# Patient Record
Sex: Female | Born: 1935 | Race: Black or African American | Hispanic: No | Smoking: Never smoker
Health system: Southern US, Community
[De-identification: ages and names within clinical notes are randomized; demographics above are authoritative.]

## PROBLEM LIST (undated history)

## (undated) DIAGNOSIS — F329 Major depressive disorder, single episode, unspecified: Secondary | ICD-10-CM

## (undated) DIAGNOSIS — F32A Depression, unspecified: Secondary | ICD-10-CM

## (undated) DIAGNOSIS — F419 Anxiety disorder, unspecified: Secondary | ICD-10-CM

## (undated) DIAGNOSIS — M51369 Other intervertebral disc degeneration, lumbar region without mention of lumbar back pain or lower extremity pain: Secondary | ICD-10-CM

## (undated) DIAGNOSIS — M5126 Other intervertebral disc displacement, lumbar region: Secondary | ICD-10-CM

## (undated) DIAGNOSIS — T7840XA Allergy, unspecified, initial encounter: Secondary | ICD-10-CM

## (undated) DIAGNOSIS — M5416 Radiculopathy, lumbar region: Secondary | ICD-10-CM

## (undated) DIAGNOSIS — E785 Hyperlipidemia, unspecified: Secondary | ICD-10-CM

## (undated) DIAGNOSIS — M545 Low back pain, unspecified: Secondary | ICD-10-CM

## (undated) DIAGNOSIS — M1990 Unspecified osteoarthritis, unspecified site: Secondary | ICD-10-CM

## (undated) DIAGNOSIS — M4800 Spinal stenosis, site unspecified: Secondary | ICD-10-CM

## (undated) DIAGNOSIS — M5136 Other intervertebral disc degeneration, lumbar region: Secondary | ICD-10-CM

## (undated) DIAGNOSIS — I10 Essential (primary) hypertension: Secondary | ICD-10-CM

## (undated) DIAGNOSIS — N319 Neuromuscular dysfunction of bladder, unspecified: Secondary | ICD-10-CM

## (undated) DIAGNOSIS — F0280 Dementia in other diseases classified elsewhere without behavioral disturbance: Secondary | ICD-10-CM

## (undated) HISTORY — DX: Other intervertebral disc displacement, lumbar region: M51.26

## (undated) HISTORY — DX: Allergy, unspecified, initial encounter: T78.40XA

## (undated) HISTORY — PX: WISDOM TOOTH EXTRACTION: SHX21

## (undated) HISTORY — PX: ROTATOR CUFF REPAIR: SHX139

## (undated) HISTORY — PX: OTHER SURGICAL HISTORY: SHX169

## (undated) HISTORY — DX: Neuromuscular dysfunction of bladder, unspecified: N31.9

## (undated) HISTORY — DX: Anxiety disorder, unspecified: F41.9

## (undated) HISTORY — DX: Major depressive disorder, single episode, unspecified: F32.9

## (undated) HISTORY — PX: CATARACT EXTRACTION: SUR2

## (undated) HISTORY — DX: Dementia in other diseases classified elsewhere, unspecified severity, without behavioral disturbance, psychotic disturbance, mood disturbance, and anxiety: F02.80

## (undated) HISTORY — DX: Hyperlipidemia, unspecified: E78.5

## (undated) HISTORY — DX: Other intervertebral disc degeneration, lumbar region without mention of lumbar back pain or lower extremity pain: M51.369

## (undated) HISTORY — DX: Low back pain: M54.5

## (undated) HISTORY — DX: Other intervertebral disc degeneration, lumbar region: M51.36

## (undated) HISTORY — DX: Depression, unspecified: F32.A

## (undated) HISTORY — DX: Low back pain, unspecified: M54.50

## (undated) HISTORY — DX: Essential (primary) hypertension: I10

## (undated) HISTORY — DX: Unspecified osteoarthritis, unspecified site: M19.90

## (undated) HISTORY — PX: BREAST CYST EXCISION: SHX579

## (undated) HISTORY — DX: Radiculopathy, lumbar region: M54.16

## (undated) HISTORY — DX: Spinal stenosis, site unspecified: M48.00

---

## 2016-08-25 DIAGNOSIS — Z1231 Encounter for screening mammogram for malignant neoplasm of breast: Secondary | ICD-10-CM | POA: Diagnosis not present

## 2016-08-25 DIAGNOSIS — E785 Hyperlipidemia, unspecified: Secondary | ICD-10-CM | POA: Diagnosis not present

## 2016-08-25 DIAGNOSIS — I10 Essential (primary) hypertension: Secondary | ICD-10-CM | POA: Diagnosis not present

## 2016-10-13 DIAGNOSIS — Z1231 Encounter for screening mammogram for malignant neoplasm of breast: Secondary | ICD-10-CM | POA: Diagnosis not present

## 2016-12-08 DIAGNOSIS — H524 Presbyopia: Secondary | ICD-10-CM | POA: Diagnosis not present

## 2016-12-08 DIAGNOSIS — H5213 Myopia, bilateral: Secondary | ICD-10-CM | POA: Diagnosis not present

## 2016-12-08 DIAGNOSIS — H43813 Vitreous degeneration, bilateral: Secondary | ICD-10-CM | POA: Diagnosis not present

## 2016-12-08 DIAGNOSIS — H04123 Dry eye syndrome of bilateral lacrimal glands: Secondary | ICD-10-CM | POA: Diagnosis not present

## 2017-03-30 DIAGNOSIS — N319 Neuromuscular dysfunction of bladder, unspecified: Secondary | ICD-10-CM | POA: Diagnosis not present

## 2017-03-30 DIAGNOSIS — I10 Essential (primary) hypertension: Secondary | ICD-10-CM | POA: Diagnosis not present

## 2017-03-30 DIAGNOSIS — G8929 Other chronic pain: Secondary | ICD-10-CM | POA: Diagnosis not present

## 2017-03-30 DIAGNOSIS — E785 Hyperlipidemia, unspecified: Secondary | ICD-10-CM | POA: Diagnosis not present

## 2017-03-30 DIAGNOSIS — M545 Low back pain: Secondary | ICD-10-CM | POA: Diagnosis not present

## 2017-04-16 DIAGNOSIS — Z23 Encounter for immunization: Secondary | ICD-10-CM | POA: Diagnosis not present

## 2017-06-24 DIAGNOSIS — G8929 Other chronic pain: Secondary | ICD-10-CM | POA: Diagnosis not present

## 2017-06-24 DIAGNOSIS — M545 Low back pain: Secondary | ICD-10-CM | POA: Diagnosis not present

## 2017-06-24 DIAGNOSIS — M48062 Spinal stenosis, lumbar region with neurogenic claudication: Secondary | ICD-10-CM | POA: Diagnosis not present

## 2017-07-13 DIAGNOSIS — J0141 Acute recurrent pansinusitis: Secondary | ICD-10-CM | POA: Diagnosis not present

## 2017-07-13 DIAGNOSIS — R69 Illness, unspecified: Secondary | ICD-10-CM | POA: Diagnosis not present

## 2017-07-13 DIAGNOSIS — R05 Cough: Secondary | ICD-10-CM | POA: Diagnosis not present

## 2017-07-13 DIAGNOSIS — R6889 Other general symptoms and signs: Secondary | ICD-10-CM | POA: Diagnosis not present

## 2017-07-13 DIAGNOSIS — J40 Bronchitis, not specified as acute or chronic: Secondary | ICD-10-CM | POA: Diagnosis not present

## 2017-09-16 DIAGNOSIS — M5416 Radiculopathy, lumbar region: Secondary | ICD-10-CM | POA: Diagnosis not present

## 2017-10-07 DIAGNOSIS — M5416 Radiculopathy, lumbar region: Secondary | ICD-10-CM | POA: Diagnosis not present

## 2017-10-07 DIAGNOSIS — M4807 Spinal stenosis, lumbosacral region: Secondary | ICD-10-CM | POA: Diagnosis not present

## 2017-11-03 DIAGNOSIS — M4807 Spinal stenosis, lumbosacral region: Secondary | ICD-10-CM | POA: Diagnosis not present

## 2017-11-11 DIAGNOSIS — Z1231 Encounter for screening mammogram for malignant neoplasm of breast: Secondary | ICD-10-CM | POA: Diagnosis not present

## 2017-11-18 DIAGNOSIS — Z0000 Encounter for general adult medical examination without abnormal findings: Secondary | ICD-10-CM | POA: Diagnosis not present

## 2017-12-02 DIAGNOSIS — M4807 Spinal stenosis, lumbosacral region: Secondary | ICD-10-CM | POA: Diagnosis not present

## 2017-12-02 DIAGNOSIS — M5416 Radiculopathy, lumbar region: Secondary | ICD-10-CM | POA: Diagnosis not present

## 2018-02-19 ENCOUNTER — Ambulatory Visit (INDEPENDENT_AMBULATORY_CARE_PROVIDER_SITE_OTHER): Payer: Medicare Other | Admitting: Urgent Care

## 2018-02-19 ENCOUNTER — Encounter: Payer: Self-pay | Admitting: Urgent Care

## 2018-02-19 ENCOUNTER — Other Ambulatory Visit: Payer: Self-pay

## 2018-02-19 ENCOUNTER — Ambulatory Visit: Payer: Medicare Other

## 2018-02-19 ENCOUNTER — Ambulatory Visit (INDEPENDENT_AMBULATORY_CARE_PROVIDER_SITE_OTHER): Payer: Medicare Other

## 2018-02-19 VITALS — BP 124/76 | HR 88 | Temp 97.8°F | Resp 16 | Ht 60.0 in | Wt 132.6 lb

## 2018-02-19 DIAGNOSIS — W19XXXA Unspecified fall, initial encounter: Secondary | ICD-10-CM

## 2018-02-19 DIAGNOSIS — M79604 Pain in right leg: Secondary | ICD-10-CM | POA: Diagnosis not present

## 2018-02-19 DIAGNOSIS — M79651 Pain in right thigh: Secondary | ICD-10-CM

## 2018-02-19 DIAGNOSIS — E782 Mixed hyperlipidemia: Secondary | ICD-10-CM

## 2018-02-19 DIAGNOSIS — S7011XA Contusion of right thigh, initial encounter: Secondary | ICD-10-CM

## 2018-02-19 NOTE — Progress Notes (Signed)
    MRN: [id number] DOB: February 27, 1931  Subjective:   Melissa [name] is a 84 y.o. female presenting for 4 month history of persistent right hip pain s/p fall onto her steps.  Patient has been using Tylenol without much change.  Cannot tolerate aspirin very well but admits that Aleve has helped her tremendously in the past.  Reports that in the past 4 months she has been extremely active due to her husband's declining health and eventual passing.  She has not admits that she is undergoing grieving and has a history of depression.  Has been using Xanax at 0.5 mg more frequently.  Denies thoughts of suicide or homicidal ideation.  [place] has a current medication list which includes the following prescription(s): alprazolam and atorvastatin. Also is allergic to aspirin.  Melissa [name]  has a past medical history of Allergy, Anxiety, Depression, and Hyperlipidemia. Denies past surgical history.  Objective:   Vitals: BP 124/76   Pulse 88   Temp 97.8 F (36.6 C)   Resp 16   Ht 5' (1.524 m)   Wt 132 lb 9.6 oz (60.1 kg)   SpO2 96%   BMI 25.90 kg/m   Physical Exam  Constitutional: She is oriented to person, place, and time. She appears well-developed and well-nourished.  Cardiovascular: Normal rate.  Pulmonary/Chest: Effort normal.  Musculoskeletal:       Right upper leg: She exhibits tenderness (over area depicted by report only, not appreciable on exam). She exhibits no bony tenderness, no swelling, no edema, no deformity and no laceration.       Legs: Neurological: She is alert and oriented to person, place, and time.  Skin: Skin is warm and dry.  Psychiatric: She has a normal mood and affect.   Dg Femur, Min 2 Views Right  Result Date: 02/19/2018 CLINICAL DATA:  84 year old female with history of trauma from a fall 2 weeks ago, now complaining of right lateral and posterior leg pain. EXAM: RIGHT FEMUR 2 VIEWS COMPARISON:  No priors. FINDINGS: There is no evidence of fracture or other focal  bone lesions. Soft tissues are unremarkable. IMPRESSION: Negative. Electronically Signed   By: Trudie Reedaniel  Entrikin M.D.   On: 02/19/2018 14:58   Assessment and Plan :   Contusion of right thigh, initial encounter  Fall, initial encounter - Plan: DG FEMUR, MIN 2 VIEWS RIGHT, CANCELED: DG HIP UNILAT W OR W/O PELVIS 2-3 VIEWS RIGHT  Right thigh pain - Plan: DG FEMUR, MIN 2 VIEWS RIGHT, CANCELED: DG HIP UNILAT W OR W/O PELVIS 2-3 VIEWS RIGHT  Mixed hyperlipidemia  Patient does very well with naproxen. I counseled that she has a contusion that has not healed well due to her level of physical activity and lack of anti-inflammatory.  We will start conservative management, return to clinic precautions reviewed.  Patient is in agreement with treatment plan.  I did caution patient on using Xanax which the patient was not receptive to.  Wallis BambergMario Haliey Romberg, PA-C Primary Care at [facility] [phone] 02/19/2018  1:55 PM

## 2018-02-19 NOTE — Patient Instructions (Addendum)
You can take 1-2 (Aleve) naproxen per day for the next 3-4 days. Rest is important to allow your contusion to heal.    Contusion A contusion is a deep bruise. Contusions are the result of a blunt injury to tissues and muscle fibers under the skin. The injury causes bleeding under the skin. The skin overlying the contusion may turn blue, purple, or yellow. Minor injuries will give you a painless contusion, but more severe contusions may stay painful and swollen for a few weeks. What are the causes? This condition is usually caused by a blow, trauma, or direct force to an area of the body. What are the signs or symptoms? Symptoms of this condition include:  Swelling of the injured area.  Pain and tenderness in the injured area.  Discoloration. The area may have redness and then turn blue, purple, or yellow.  How is this diagnosed? This condition is diagnosed based on a physical exam and medical history. An X-ray, CT scan, or MRI may be needed to determine if there are any associated injuries, such as broken bones (fractures). How is this treated? Specific treatment for this condition depends on what area of the body was injured. In general, the best treatment for a contusion is resting, icing, applying pressure to (compression), and elevating the injured area. This is often called the RICE strategy. Over-the-counter anti-inflammatory medicines may also be recommended for pain control. Follow these instructions at home:  Rest the injured area.  If directed, apply ice to the injured area: ? Put ice in a plastic bag. ? Place a towel between your skin and the bag. ? Leave the ice on for 20 minutes, 2-3 times per day.  If directed, apply light compression to the injured area using an elastic bandage. Make sure the bandage is not wrapped too tightly. Remove and reapply the bandage as directed by your health care provider.  If possible, raise (elevate) the injured area above the level of your  heart while you are sitting or lying down.  Take over-the-counter and prescription medicines only as told by your health care provider. Contact a health care provider if:  Your symptoms do not improve after several days of treatment.  Your symptoms get worse.  You have difficulty moving the injured area. Get help right away if:  You have severe pain.  You have numbness in a hand or foot.  Your hand or foot turns pale or cold. This information is not intended to replace advice given to you by your health care provider. Make sure you discuss any questions you have with your health care provider. Document Released: 05/06/2005 Document Revised: 12/05/2015 Document Reviewed: 12/12/2014 Elsevier Interactive Patient Education  2018 [employer]     IF you received an x-ray today, you will receive an invoice from [facility]. Please contact [facility] at [phone] with questions or concerns regarding your invoice.   IF you received labwork today, you will receive an invoice from [facility]. Please contact [facility] at [phone] with questions or concerns regarding your invoice.   Our billing staff will not be able to assist you with questions regarding bills from these companies.  You will be contacted with the lab results as soon as they are available. The fastest way to get your results is to activate your My Chart account. Instructions are located on the last page of this paperwork. If you have not heard from us regarding the results in 2 weeks, please contact this office.

## 2018-02-23 ENCOUNTER — Encounter: Payer: Self-pay | Admitting: Internal Medicine

## 2018-02-23 ENCOUNTER — Ambulatory Visit (INDEPENDENT_AMBULATORY_CARE_PROVIDER_SITE_OTHER): Payer: Medicare Other | Admitting: Internal Medicine

## 2018-02-23 VITALS — BP 148/98 | HR 82 | Ht <= 58 in | Wt 130.0 lb

## 2018-02-23 DIAGNOSIS — R8290 Unspecified abnormal findings in urine: Secondary | ICD-10-CM | POA: Diagnosis not present

## 2018-02-23 DIAGNOSIS — E785 Hyperlipidemia, unspecified: Secondary | ICD-10-CM | POA: Diagnosis not present

## 2018-02-23 DIAGNOSIS — I10 Essential (primary) hypertension: Secondary | ICD-10-CM | POA: Diagnosis not present

## 2018-02-23 DIAGNOSIS — Z13220 Encounter for screening for lipoid disorders: Secondary | ICD-10-CM

## 2018-02-23 DIAGNOSIS — F411 Generalized anxiety disorder: Secondary | ICD-10-CM | POA: Insufficient documentation

## 2018-02-23 DIAGNOSIS — F4321 Adjustment disorder with depressed mood: Secondary | ICD-10-CM

## 2018-02-23 DIAGNOSIS — Z0000 Encounter for general adult medical examination without abnormal findings: Secondary | ICD-10-CM | POA: Diagnosis not present

## 2018-02-23 DIAGNOSIS — N390 Urinary tract infection, site not specified: Secondary | ICD-10-CM

## 2018-02-23 DIAGNOSIS — F4320 Adjustment disorder, unspecified: Secondary | ICD-10-CM

## 2018-02-23 DIAGNOSIS — Z1329 Encounter for screening for other suspected endocrine disorder: Secondary | ICD-10-CM | POA: Diagnosis not present

## 2018-02-23 LAB — POCT URINALYSIS DIPSTICK
BILIRUBIN UA: NEGATIVE
Blood, UA: NEGATIVE
Glucose, UA: NEGATIVE
KETONES UA: NEGATIVE
Nitrite, UA: NEGATIVE
PROTEIN UA: NEGATIVE
SPEC GRAV UA: 1.01 (ref 1.010–1.025)
UROBILINOGEN UA: 0.2 U/dL
pH, UA: 6 (ref 5.0–8.0)

## 2018-02-23 MED ORDER — ALPRAZOLAM 0.5 MG PO TABS: ORAL_TABLET | ORAL | 0 refills | Status: DC

## 2018-02-23 NOTE — Progress Notes (Signed)
Subjective:    Patient ID: Melissa [name], female    DOB: 06/23/1932, 84 y.o.   MRN: [id number]  HPI First visit for this pleasant 84 year old Female, mother of Nelson Chimes who is also a patient here, for Medicare Wellness exam, health maintenance exam and evaluation of medical issues.  Patient was recently seen July 13 at [facility] regarding right leg pain status post a fall about 4 months ago.  Patient was tender in her right upper thigh area according to note.  X-ray of the right femur was negative and she was advised to try Aleve.  Patient previously seen at [facility] in [place].  She had lab work done January 2018 the year which was normal.  This included a CBC, lipid panel and C met.  Patient recently lost her husband.  They were  married about 60 years.  She and her husband moved from [place] to [place] not too long ago to be near their [place]. Dr. Gwyndolyn Saxon B. Zachman was the 8th President of [employer].   Patient has a history of lumbar radiculopathy and spinal stenosis.  She says she had an ablation procedure for that in [place] a number of years ago.  Says she has a history of bladder dysfunction with urinary frequency and had a sacral nerve stimulator placed in [place] a number of years ago as well.  However she had the device turned off because it was causing some odd vibrations in her vagina and did not really help the bladder problem.  History of benign right breast biopsy in the remote past.  Current problems include hypertension and hyperlipidemia.  She is intolerant of aspirin it causes dizziness and nausea.  Codeine causes dizziness and nausea.  Tramadol causes dizziness and weakness.  Recently she has had issues sleeping since her husband passed away.  She does have some Xanax on hand which she takes when she is driving sometimes.  She is also on Ritalin      Review of Systems  Constitutional:   Anxiety particularly driving around curves  Respiratory: Negative.   Cardiovascular: Negative.   Gastrointestinal: Negative.   Genitourinary: Negative.   All other systems reviewed and are negative.      Objective:   Physical Exam  Constitutional: She is oriented to person, place, and time. She appears well-developed and well-nourished. No distress.  HENT:  Head: Normocephalic and atraumatic.  Right Ear: External ear normal.  Left Ear: External ear normal.  Mouth/Throat: Oropharynx is clear and moist.  Eyes: Pupils are equal, round, and reactive to light. Conjunctivae are normal. Right eye exhibits no discharge. Left eye exhibits no discharge.  Neck: Neck supple. No JVD present. No thyromegaly present.  Cardiovascular: Normal rate, regular rhythm and normal heart sounds.  No murmur heard. Pulmonary/Chest: Effort normal and breath sounds normal. No respiratory distress. She has no wheezes. She has no rales.  Abdominal: Soft. Bowel sounds are normal. She exhibits no distension and no mass. There is no tenderness. There is no rebound.  Genitourinary:  Genitourinary Comments: Bimanual normal  Musculoskeletal: She exhibits no edema.  Neurological: She is alert and oriented to person, place, and time. She displays normal reflexes. No cranial nerve deficit. She exhibits normal muscle tone. Coordination normal.  Skin: Skin is warm and dry. She is not diaphoretic.  Psychiatric: She has a normal mood and affect. Her behavior is normal. Judgment and thought content normal.  Vitals reviewed.  Assessment & Plan:  Hyperlipidemia  Essential hypertension  Anxiety  Grief reaction  History of lumbar radiculopathy and spinal stenosis status post ablation procedure in [place]  History of bladder dysfunction and is status post sacral nerve stimulator placement but currently not using this  History of benign right breast biopsy  Plan: I have recommended patient take Xanax at  least twice daily certainly at night for sleep.  Can follow-up in 3 to 6 months.  She may take Aleve or Advil for musculoskeletal pain which should improve after her recent fall. Subjective:   Patient presents for Medicare Annual/Subsequent preventive examination.  Review Past Medical/Family/Social: See above  Risk Factors  Current exercise habits: Mostly sedentary Dietary issues discussed: Low-fat low carbohydrate  Cardiac risk factors: Hyperlipidemia and hypertension  Depression Screen  (Note: if answer to either of the following is "Yes", a more complete depression screening is indicated)   Over the past two weeks, have you felt down, depressed or hopeless?  yes since her husband died Over the past two weeks, have you felt little interest or pleasure in doing things?  Yes since lost her husband Have you lost interest or pleasure in daily life? No Do you often feel hopeless?  Yes since lost her husband Do you cry easily over simple problems? No   Activities of Daily Living  In your present state of health, do you have any difficulty performing the following activities?:   Driving?  yes gets anxious Managing money? No  Feeding yourself? No  Getting from bed to chair? No  Climbing a flight of stairs? No  Preparing food and eating?: No  Bathing or showering? No  Getting dressed: No  Getting to the toilet? No  Using the toilet:No  Moving around from place to place: No  In the past year have you fallen or had a near fall?  Yes Are you sexually active? No  Do you have more than one partner? No   Hearing Difficulties: No  Do you often ask people to speak up or repeat themselves? No  Do you experience ringing or noises in your ears? No  Do you have difficulty understanding soft or whispered voices? No  Do you feel that you have a problem with memory? No Do you often misplace items?  yes   Home Safety:  Do you have a smoke alarm at your residence? Yes Do you have grab bars  in the bathroom?  No Do you have throw rugs in your house?  Yes   Cognitive Testing  Alert? Yes Normal Appearance?Yes  Oriented to person? Yes Place? Yes  Time? Yes  Recall of three objects? Yes  Can perform simple calculations? Yes  Displays appropriate judgment?Yes  Can read the correct time from a watch face?Yes   List the Names of Other Physician/Practitioners you currently use:  See referral list for the physicians patient is currently seeing.     Review of Systems: See above   Objective:     General appearance: Appears stated age and slightly frail  Head: Normocephalic, without obvious abnormality, atraumatic  Eyes: conj clear, EOMi PEERLA  Ears: normal TM's and external ear canals both ears  Nose: Nares normal. Septum midline. Mucosa normal. No drainage or sinus tenderness.  Throat: lips, mucosa, and tongue normal; teeth and gums normal  Neck: no adenopathy, no carotid bruit, no JVD, supple, symmetrical, trachea midline and thyroid not enlarged, symmetric, no tenderness/mass/nodules  No CVA tenderness.  Lungs: clear to auscultation bilaterally  Breasts: normal appearance, no masses or tenderness  Regular rate and rhythm S1, S2 normal Abdomen: soft, non-tender; bowel sounds normal; no masses, no organomegaly  Musculoskeletal: ROM normal in all joints, no crepitus, no deformity, Normal muscle strengthen. Back  is symmetric, no curvature. Skin: Skin color, texture, turgor normal. No rashes or lesions  Lymph nodes: Cervical, supraclavicular, and axillary nodes normal.  Neurologic: CN 2 -12 Normal, Normal symmetric reflexes. Normal coordination and gait  Psych: Alert & Oriented x 3, Mood appear stable.    Assessment:    Annual wellness medicare exam   Plan:    During the course of the visit the patient was educated and counseled about appropriate screening and preventive services including:  Annual mammogram  Annual flu vaccine  Family needs to check on her  pneumococcal vaccines.  They apparently were given in [place].  [facility] here does not have record of them      Patient Instructions (the written plan) was given to the patient.  Medicare Attestation  I have personally reviewed:  The patient's medical and social history  Their use of alcohol, tobacco or illicit drugs  Their current medications and supplements  The patient's functional ability including ADLs,fall risks, home safety risks, cognitive, and hearing and visual impairment  Diet and physical activities  Evidence for depression or mood disorders  The patient's weight, height, BMI, and visual acuity have been recorded in the chart. I have made referrals, counseling, and provided education to the patient based on review of the above and I have provided the patient with a written personalized care plan for preventive services.

## 2018-02-23 NOTE — Patient Instructions (Addendum)
It was a pleasure to see you today. Labs drawn and pending. We will contact you with results. May take Xanax 0.5 mg at bedtime for sleep and a half tab q am if anxious.Urine culture pending. cipro as directed for UTI

## 2018-02-24 ENCOUNTER — Encounter: Payer: Self-pay | Admitting: Urgent Care

## 2018-02-24 LAB — CBC WITH DIFFERENTIAL/PLATELET
BASOS ABS: 60 {cells}/uL (ref 0–200)
Basophils Relative: 1.3 %
EOS PCT: 7.3 %
Eosinophils Absolute: 336 cells/uL (ref 15–500)
HEMATOCRIT: 39.9 % (ref 35.0–45.0)
HEMOGLOBIN: 13.3 g/dL (ref 11.7–15.5)
Lymphs Abs: 1808 cells/uL (ref 850–3900)
MCH: 27.5 pg (ref 27.0–33.0)
MCHC: 33.3 g/dL (ref 32.0–36.0)
MCV: 82.4 fL (ref 80.0–100.0)
MONOS PCT: 9.9 %
MPV: 11.2 fL (ref 7.5–12.5)
NEUTROS ABS: 1941 {cells}/uL (ref 1500–7800)
NEUTROS PCT: 42.2 %
Platelets: 217 10*3/uL (ref 140–400)
RBC: 4.84 10*6/uL (ref 3.80–5.10)
RDW: 13.3 % (ref 11.0–15.0)
Total Lymphocyte: 39.3 %
WBC mixed population: 455 cells/uL (ref 200–950)
WBC: 4.6 10*3/uL (ref 3.8–10.8)

## 2018-02-24 LAB — COMPLETE METABOLIC PANEL WITH GFR
AG Ratio: 1.4 (calc) (ref 1.0–2.5)
ALBUMIN MSPROF: 4.4 g/dL (ref 3.6–5.1)
ALT: 14 U/L (ref 6–29)
AST: 16 U/L (ref 10–35)
Alkaline phosphatase (APISO): 77 U/L (ref 33–130)
BILIRUBIN TOTAL: 1.1 mg/dL (ref 0.2–1.2)
BUN / CREAT RATIO: 25 (calc) — AB (ref 6–22)
BUN: 23 mg/dL (ref 7–25)
CHLORIDE: 101 mmol/L (ref 98–110)
CO2: 28 mmol/L (ref 20–32)
Calcium: 10.3 mg/dL (ref 8.6–10.4)
Creat: 0.93 mg/dL — ABNORMAL HIGH (ref 0.60–0.88)
GFR, EST AFRICAN AMERICAN: 66 mL/min/{1.73_m2} (ref >=60)
GFR, EST NON AFRICAN AMERICAN: 57 mL/min/{1.73_m2} — AB (ref >=60)
GLUCOSE: 94 mg/dL (ref 65–99)
Globulin: 3.1 g/dL (calc) (ref 1.9–3.7)
POTASSIUM: 4.8 mmol/L (ref 3.5–5.3)
SODIUM: 138 mmol/L (ref 135–146)
TOTAL PROTEIN: 7.5 g/dL (ref 6.1–8.1)

## 2018-02-24 LAB — LIPID PANEL
CHOLESTEROL: 184 mg/dL (ref <200)
HDL: 62 mg/dL (ref >50)
LDL Cholesterol (Calc): 103 mg/dL (calc) — ABNORMAL HIGH
Non-HDL Cholesterol (Calc): 122 mg/dL (calc) (ref <130)
TRIGLYCERIDES: 91 mg/dL (ref <150)
Total CHOL/HDL Ratio: 3 (calc) (ref <5.0)

## 2018-02-24 LAB — TSH: TSH: 1.39 m[IU]/L (ref 0.40–4.50)

## 2018-02-25 ENCOUNTER — Telehealth: Payer: Self-pay | Admitting: Internal Medicine

## 2018-02-25 DIAGNOSIS — Z23 Encounter for immunization: Secondary | ICD-10-CM | POA: Diagnosis not present

## 2018-02-25 LAB — URINE CULTURE
MICRO NUMBER:: 90846455
SPECIMEN QUALITY:: ADEQUATE

## 2018-02-25 NOTE — Telephone Encounter (Signed)
Called and states that she hasn't been able to find her other records.  She is guessing that it has been at least 5 years since she has had "that other" pneumonia shot.  She just wanted to make sure that you were aware of that.  She has no idea where that would've been done at though.    Thank you.

## 2018-02-25 NOTE — Telephone Encounter (Signed)
Can you call Melissa [name] her daughter and see if she can call  [place] and get dates of shots.

## 2018-02-28 ENCOUNTER — Other Ambulatory Visit: Payer: Self-pay | Admitting: Internal Medicine

## 2018-02-28 MED ORDER — CIPROFLOXACIN HCL 250 MG PO TABS: 250.0000 mg | ORAL_TABLET | Freq: Two times a day (BID) | ORAL | 0 refills | Status: DC

## 2018-03-01 NOTE — Telephone Encounter (Signed)
Spoke with Alvino Chapelllen today and asked her to contact patient's contacts in [place] to see if she can obtain shot records.  She will work on this.  Patient did call today to state that she obtained pneumonia shot [facility] (late week[facility], but cannot remember the exact date.  She was also quite concerned about the Cipro that was prescribed for her for a UTI.  There was a side effect on it that stated it could cause "death" and she was afraid of this.  Advised that she should contact her pharmacist about this and ask questions and he/she should be able to answer these questions about anything she is concerned about.  She needs to be treated for this UTI and then come back in 2 weeks and provide a Urine for us to be sure the UTI has been resolved so we know it has been treated and gone away.    Asked her daughter, Alvino Chapelllen to talk with her about this treatment as well to make sure she feels comfortable about taking this medication.  Patient is very sensitive since her husband passed away.

## 2018-03-14 ENCOUNTER — Encounter: Payer: Self-pay | Admitting: Internal Medicine

## 2018-03-14 ENCOUNTER — Ambulatory Visit (INDEPENDENT_AMBULATORY_CARE_PROVIDER_SITE_OTHER): Payer: Medicare Other | Admitting: Internal Medicine

## 2018-03-14 VITALS — BP 90/60 | HR 88 | Temp 98.2°F | Ht <= 58 in | Wt 132.0 lb

## 2018-03-14 DIAGNOSIS — Z23 Encounter for immunization: Secondary | ICD-10-CM

## 2018-03-14 DIAGNOSIS — R8290 Unspecified abnormal findings in urine: Secondary | ICD-10-CM

## 2018-03-14 LAB — POCT URINALYSIS DIPSTICK
BILIRUBIN UA: NEGATIVE
GLUCOSE UA: NEGATIVE
KETONES UA: NEGATIVE
Nitrite, UA: NEGATIVE
Protein, UA: POSITIVE — AB
RBC UA: NEGATIVE
Spec Grav, UA: 1.015 (ref 1.010–1.025)
Urobilinogen, UA: 0.2 E.U./dL
pH, UA: 6 (ref 5.0–8.0)

## 2018-03-14 MED ORDER — ATORVASTATIN CALCIUM 40 MG PO TABS: 40.0000 mg | ORAL_TABLET | Freq: Every day | ORAL | 0 refills | Status: DC

## 2018-03-14 NOTE — Progress Notes (Signed)
   Subjective:    Patient ID: Melissa [name], Melissa Simpsonmale    DOB: 06/23/1932, 84 y.o.   MRN: [id number]  HPI Came in for follow up recent UTI. Prevnar 13 given since we could not find dates she received these vaccines in [place].Will follow up with Pneumoncoccal 23 at next exam. Finished Cipro. Asymptomatic.Urine recultured as dipstick is abnormal.    Review of Systems     Objective:   Physical Exam        Assessment & Plan:

## 2018-03-14 NOTE — Patient Instructions (Signed)
Urine culture pending. Prevnar 13 given

## 2018-03-15 LAB — URINE CULTURE
MICRO NUMBER:: 90922744
RESULT: NO GROWTH
SPECIMEN QUALITY:: ADEQUATE

## 2018-04-09 DIAGNOSIS — H6123 Impacted cerumen, bilateral: Secondary | ICD-10-CM | POA: Diagnosis not present

## 2018-04-22 ENCOUNTER — Other Ambulatory Visit: Payer: Self-pay

## 2018-04-22 MED ORDER — ALPRAZOLAM 0.5 MG PO TABS: ORAL_TABLET | ORAL | 5 refills | Status: DC

## 2018-04-28 ENCOUNTER — Ambulatory Visit: Payer: Medicare Other | Admitting: Internal Medicine

## 2018-05-13 ENCOUNTER — Encounter: Payer: Self-pay | Admitting: Internal Medicine

## 2018-05-13 ENCOUNTER — Ambulatory Visit (INDEPENDENT_AMBULATORY_CARE_PROVIDER_SITE_OTHER): Payer: Medicare Other | Admitting: Internal Medicine

## 2018-05-13 VITALS — BP 100/60 | HR 93 | Temp 98.0°F | Ht <= 58 in | Wt 132.0 lb

## 2018-05-13 DIAGNOSIS — I10 Essential (primary) hypertension: Secondary | ICD-10-CM | POA: Diagnosis not present

## 2018-05-13 DIAGNOSIS — F411 Generalized anxiety disorder: Secondary | ICD-10-CM | POA: Diagnosis not present

## 2018-05-13 DIAGNOSIS — F4321 Adjustment disorder with depressed mood: Secondary | ICD-10-CM | POA: Diagnosis not present

## 2018-05-13 NOTE — Progress Notes (Signed)
   Subjective:    Patient ID: Melissa [name], female    DOB: 06/23/1932, 84 y.o.   MRN: [id number]  HPI 84 year old Female initially seen here in 03/11/23 after the death of her husband.  Her daughter is Cherre Robins who is also a patient here.  She was seen for grief reaction anxiety hypertension and hyperlipidemia.  She was given Xanax to take twice daily particularly at night to sleep.  Was advised to take Aleve or Advil for musculoskeletal pain that occurred after a fall. History of hypertension treated with Diovan HCTZ   Review of Systems no new complaints but talks about her husband's death once again.  Still having issues adjusting being alone.  Still anxious at times.     Objective:   Physical Exam Blood pressure stable today at 100/60.  Weight 132 pounds.  Weight was 130 pounds in 2023-03-11.  Vital signs reviewed.  Neck is supple without JVD thyromegaly or carotid bruits.  She is alert and oriented.  Chest clear to auscultation without rales or wheezing.  Cardiac exam regular rate and rhythm normal S1 and S2.  Extremities without edema.       Assessment & Plan:  Essential hypertension-stable on current regimen  Anxiety state-continue to take Xanax sparingly as needed.  I think it will help her with driving around [facility] which she does not do a lot off but sometimes needs to.  Grief reaction-still grieving loss of husband.  They were married for many years and it is lonely for her.  Family is supportive here.  Plan: Follow-up in March at which time lipids will be checked along with office visit.  25 minutes spent with patient

## 2018-06-07 ENCOUNTER — Telehealth: Payer: Self-pay | Admitting: Internal Medicine

## 2018-06-07 MED ORDER — VALSARTAN-HYDROCHLOROTHIAZIDE 80-12.5 MG PO TABS: 1.0000 | ORAL_TABLET | Freq: Every day | ORAL | 0 refills | Status: DC

## 2018-06-07 NOTE — Telephone Encounter (Signed)
Patient called to state that [facility] was effected by the Losartan recall.  She wanted to have Dr. Lenord Fellers change her to another medication.  Advised that we normally just call around and find another pharmacy that has not been effected by the recall.  Told her that I would call a few pharmacies for her.    [facility] [address] [facility] is close to her.  Called #30 to [facility] with 1 refill.    Called patient to let her know [facility].  Patient was thankful and verbalized understanding.

## 2018-06-08 ENCOUNTER — Other Ambulatory Visit: Payer: Self-pay

## 2018-06-08 MED ORDER — VALSARTAN-HYDROCHLOROTHIAZIDE 80-12.5 MG PO TABS: 1.0000 | ORAL_TABLET | Freq: Every day | ORAL | 0 refills | Status: DC

## 2018-06-09 NOTE — Patient Instructions (Addendum)
It was a pleasure to see you today.  Continue same medications.  Follow-up in spring 2020.

## 2018-07-01 ENCOUNTER — Other Ambulatory Visit: Payer: Self-pay | Admitting: Internal Medicine

## 2018-07-26 ENCOUNTER — Other Ambulatory Visit: Payer: Self-pay | Admitting: Internal Medicine

## 2018-09-05 ENCOUNTER — Ambulatory Visit (INDEPENDENT_AMBULATORY_CARE_PROVIDER_SITE_OTHER): Payer: Medicare Other | Admitting: Internal Medicine

## 2018-09-05 ENCOUNTER — Encounter: Payer: Self-pay | Admitting: Internal Medicine

## 2018-09-05 VITALS — BP 120/80 | HR 86 | Temp 98.6°F | Ht <= 58 in | Wt 128.0 lb

## 2018-09-05 DIAGNOSIS — F411 Generalized anxiety disorder: Secondary | ICD-10-CM

## 2018-09-05 DIAGNOSIS — M545 Low back pain, unspecified: Secondary | ICD-10-CM

## 2018-09-05 DIAGNOSIS — F4321 Adjustment disorder with depressed mood: Secondary | ICD-10-CM

## 2018-09-05 DIAGNOSIS — I10 Essential (primary) hypertension: Secondary | ICD-10-CM

## 2018-09-05 MED ORDER — ALPRAZOLAM 0.5 MG PO TABS: ORAL_TABLET | ORAL | 1 refills | Status: DC

## 2018-09-05 MED ORDER — OLANZAPINE 2.5 MG PO TABS
2.5000 mg | ORAL_TABLET | Freq: Every day | ORAL | 0 refills | Status: DC
Start: 2018-09-05 — End: 2018-09-30

## 2018-09-05 NOTE — Patient Instructions (Signed)
We will try to see if physiatry [place] can see her.  We need more information.  Perhaps her daughter can help [place].  Try Xanax 0.5 mg before driving instead of half of that dose.  Try Zyprexa 2.5 mg at bedtime for sleep.  May need to see psychiatrist regarding grief reaction.

## 2018-09-05 NOTE — Progress Notes (Addendum)
   Subjective:    Patient ID: Melissa [name], female    DOB: 06/23/1932, 85 y.o.   MRN: [id number]  HPI 85 year old Female had Medtronic Interstim sacral stimulator put in for bladder issues in [place] a number of years ago. Used to get epidural steroid injections in [place] for back pain. Now says stimulator is sore and back hurts.No old records available today to review.  Says she saw a physician here who injected her back once- she can't remember his name.  Still has PTSD over her husband's death and tells story about it once again. Has been going to grief counseling. Takes Xanax at night but wakes up and has trouble falling back asleep. Says daughter wants her to move in with her and her family.    Review of Systemsstiffness and pain right lower back over stimulator area.     Objective:   Physical Exam  Straight leg raising is negative at 90 degrees bilaterally.  She is tender over the stimulator area in her right lower back and buttock area.      Assessment & Plan:  Anxiety state-May need to see psychiatrist regarding this  PTSD from husband's death-see above  Right lower back pain-see if we can refer her to physiatry wrist for opinion.  It may be she cannot have an MRI of her back with the stimulator.  The problem is we do not have any records from [place].  Plan: Try Zyprexa 2.5 mg at bedtime instead of Xanax.  May take Xanax 0.5 mg before driving.  She gets anxious driving around curbs and hills.  She drove herself here today.  30 minutes spent with patient today face-to-face talking about these issues.  Have also called her daughter, Cherre Robins and explained the situation to her.  Had recommended [facility] for psychology and medication consult.  Daughter does confirm patient saw Dr. Modesta Messing.  We need to see those records and she needs to make return appointment for her mother.

## 2018-09-06 ENCOUNTER — Telehealth: Payer: Self-pay

## 2018-09-06 NOTE — Telephone Encounter (Signed)
Refer to Corie Chiquito at [facility]

## 2018-09-06 NOTE — Telephone Encounter (Signed)
Patient called she said she received a call from [facility] and she is not taking any patients right now, she wants to know if there is anybody else that you would recommend?

## 2018-09-06 NOTE — Telephone Encounter (Signed)
Done, provided patient with number and she stated she will call her tomorrow.

## 2018-09-26 DIAGNOSIS — M5416 Radiculopathy, lumbar region: Secondary | ICD-10-CM | POA: Diagnosis not present

## 2018-09-26 DIAGNOSIS — M48061 Spinal stenosis, lumbar region without neurogenic claudication: Secondary | ICD-10-CM | POA: Diagnosis not present

## 2018-09-26 DIAGNOSIS — M4316 Spondylolisthesis, lumbar region: Secondary | ICD-10-CM | POA: Diagnosis not present

## 2018-09-26 DIAGNOSIS — M5136 Other intervertebral disc degeneration, lumbar region: Secondary | ICD-10-CM | POA: Diagnosis not present

## 2018-09-30 ENCOUNTER — Other Ambulatory Visit: Payer: Self-pay

## 2018-09-30 MED ORDER — OLANZAPINE 2.5 MG PO TABS: 2.5000 mg | ORAL_TABLET | Freq: Every day | ORAL | 5 refills | Status: DC

## 2018-10-07 ENCOUNTER — Ambulatory Visit: Payer: BLUE CROSS/BLUE SHIELD | Admitting: Psychiatry

## 2018-10-12 ENCOUNTER — Ambulatory Visit (INDEPENDENT_AMBULATORY_CARE_PROVIDER_SITE_OTHER): Payer: Medicare Other | Admitting: Psychiatry

## 2018-10-12 VITALS — BP 177/93 | HR 95 | Ht 60.0 in | Wt 131.0 lb

## 2018-10-12 DIAGNOSIS — F4321 Adjustment disorder with depressed mood: Secondary | ICD-10-CM

## 2018-10-12 NOTE — Progress Notes (Signed)
[facility] MD/PA/NP Initial Note  10/13/2018 2:25 PM Melissa [name]  MRN:  [id number]  Chief Complaint:  Chief Complaint    Anxiety; Depression; Insomnia    Anxiety, Depression  HPI: Patient is an 85 year old female seen for initial evaluation due to anxiety and depression since the death of her husband of 71 years on Dec 28, 2017.  Patient recounts the events of husband's illness and death.  She reports that he had 2 types of cancer. Reports husband was sent to the hospital and she was told that he could likely go to rehab. Reports that she stayed in the room with her husband since he wanted her by his side when he was sick. She reports that she did not sleep or eat during his hospitalization. She reports that her husband did not want nursing staff to assist with toileting and wanted to have her help him instead. She reports that she was with him when he started to exhibit slurred speech, severe HA, swallowing difficulty. She reports that the staff came in and rushed him to ICU and called for a chaplain and a Education officer, museum- "nobody told me anything" and she was unaware that he was having a CVA.  She was told she she could not go to the ICU with all of their belongings. She had to call her daughter and her daughter came to help get the belongings and then took her to ICU. They visited him and went to eat and in the meantime "determined that he was brain dead and I had to make a decision." She reports that she was unable to make the decision and her daughter said pt's husband would not want to live in that condition. She reports that she had encouraged her son to return to work in [place] when they thought pt's husband was going to rehab. Pt reports "I felt so guilty because he didn't want to go but I am the one that told him to go." She reports that she was encouraged to go to a hotel for family members. She reports that she was assured that staff would call her if her husband's condition worsened "but they  didn't." Her daughter called her at 5 am and said that hospital said they were trying to reach her. She reports that she accepted a ride from some people to the hospital. She reports that when she arrived a staff yelled out, "I don't know what they have told you, but he passed." She reports that she immediately collapsed on the floor and another nurse caught her. She talked with a nurse and a chaplain and then she and her daughter went and saw her husband's body.  She reports that following her husband's death that she learned something new pertaining to her husband.  She reports that she was not able to sleep or eat after learning this information and began having frequent crying episodes. She reports that she had severe insomnia and continues to have difficulty sleeping. She reports anxiety and rumination r/t recent things she has learned and trying to make connections and figure out unanswered questions. "I can't let go."  Describes laying awake at night due to rumination and recounting past events.  She reports poor concentration. She reports that she had SI in the past. Denies current SI. Reports passive death wishes.   She was born and raised in [place] until 74-7 yo when her family moved to [place]. Reports that she met her husband in college in [place]. Has a daughter and  a son. Married 68 years and reports that husband was "a good husband." Reports that husband worked in Investment banker, corporate and served on Radio producer. They moved to [place] when husband accepted a position as a Software engineer for a university. Reports that she and her husband was frequently attending benefits and events. Reports that they lived on campus and frequently entertained. She reports that she did not enjoy the public life and that she did it for her husband and did not have time for her crafts and her own interests. Reports that they moved back to [place] so that they could be close to her daughter. Reports that her children  check on her multiple times a day.   Visit Diagnosis: No diagnosis found.  Past Psychiatric History: PCP has prescribed Xanax and olanzapine for anxiety and recent signs and symptoms.  Past Medical History:  Past Medical History:  Diagnosis Date  . Allergy   . Anxiety   . Bladder dysfunction   . Bulging lumbar disc   . Depression   . Hyperlipidemia   . Hypertension   . Low back pain   . Lumbar radiculopathy   . Spinal stenosis     Past Surgical History:  Procedure Laterality Date  . bladder dysfunction        Family History:  Family History  Problem Relation Age of Onset  . Arthritis Mother     Social History:  Social History   Socioeconomic History  . Marital status: Widowed    Spouse name: Not on file  . Number of children: Not on file  . Years of education: Not on file  . Highest education level: Not on file  Occupational History  . Not on file  Social Needs  . Financial resource strain: Not on file  . Food insecurity:    Worry: Not on file    Inability: Not on file  . Transportation needs:    Medical: Not on file    Non-medical: Not on file  Tobacco Use  . Smoking status: Never Smoker  . Smokeless tobacco: Never Used  Substance and Sexual Activity  . Alcohol use: Not on file  . Drug use: Not on file  . Sexual activity: Not on file  Lifestyle  . Physical activity:    Days per week: Not on file    Minutes per session: Not on file  . Stress: Not on file  Relationships  . Social connections:    Talks on phone: Not on file    Gets together: Not on file    Attends religious service: Not on file    Active member of club or organization: Not on file    Attends meetings of clubs or organizations: Not on file    Relationship status: Not on file  Other Topics Concern  . Not on file  Social History Narrative   ** Merged History Encounter **        Allergies:  Allergies  Allergen Reactions  . Aspirin Other (See Comments)    Nausea, dizziness,  malaise.  . Aspirin Nausea Only  . Codeine   . Tramadol     Metabolic Disorder Labs: No results found for: HGBA1C, MPG No results found for: PROLACTIN Lab Results  Component Value Date   CHOL 184 02/23/2018   TRIG 91 02/23/2018   HDL 62 02/23/2018   CHOLHDL 3.0 02/23/2018   LDLCALC 103 (H) 02/23/2018   Lab Results  Component Value Date   TSH 1.39 02/23/2018  Therapeutic Level Labs: No results found for: LITHIUM No results found for: VALPROATE No components found for:  CBMZ  Current Medications: Current Outpatient Medications  Medication Sig Dispense Refill  . acetaminophen (TYLENOL) 325 MG tablet Take 250 mg by mouth every 6 (six) hours as needed.    . ALPRAZolam (XANAX) 0.5 MG tablet One po q am prn anxiety 30 tablet 1  . atorvastatin (LIPITOR) 40 MG tablet TAKE 1 TABLET BY MOUTH DAILY 90 tablet 3  . cholecalciferol (VITAMIN D) 1000 units tablet Take 1,000 Units by mouth daily.    . diphenhydrAMINE-APAP, sleep, (EXCEDRIN PM PO) Take by mouth.    . liver oil-zinc oxide (DESITIN) 40 % ointment Apply 1 application topically as needed for irritation.    . magnesium 30 MG tablet Take 250 mg by mouth daily.    Marland Kitchen OLANZapine (ZYPREXA) 2.5 MG tablet Take 1 tablet (2.5 mg total) by mouth at bedtime. 30 tablet 5  . Omega-3 Fatty Acids (FISH OIL) 1200 MG CAPS Take 1,200 mg by mouth daily.    . valsartan-hydrochlorothiazide (DIOVAN-HCT) 80-12.5 MG tablet TAKE 1 TABLET BY MOUTH EVERY DAY 90 tablet 0  . vitamin B-12 (CYANOCOBALAMIN) 250 MCG tablet Take 250 mcg by mouth daily.    . vitamin E 1000 UNIT capsule Take 1,000 Units by mouth daily.     No current facility-administered medications for this visit.     Medication Side Effects: none  Orders placed this visit:  No orders of the defined types were placed in this encounter.   Psychiatric Specialty Exam:  ROS  Blood pressure (!) 177/93, pulse 95, height 5' (1.524 m), weight 131 lb (59.4 kg).Body mass index is 25.58 kg/m.   General Appearance: Neat and Well Groomed  Eye Contact:  Good  Speech:  Clear and Coherent and Talkative  Volume:  Normal  Mood:  Anxious and Depressed  Affect:  Anxious  Thought Process:  Coherent and Goal Directed  Orientation:  Full (Time, Place, and Person)  Thought Content: Rumination   Suicidal Thoughts:  No  Homicidal Thoughts:  No  Memory:  WNL  Judgement:  Good  Insight:  Good  Psychomotor Activity:  Normal  Concentration:  Concentration: Good  Recall:  Good  Fund of Knowledge: Good  Language: Good  Assets:  Communication Skills Desire for Improvement Resilience Social Support  ADL's:  Intact  Cognition: WNL  Prognosis:  Good   Screenings:  PHQ2-9     Office Visit from 02/19/2018 in [facility] at [facility]  PHQ-2 Total Score  0      Receiving Psychotherapy: No   Treatment Plan/Recommendations: Patient seen for 60 minutes and greater than 50% of visit spent counseling patient and coordination of care to include records from PCP and also discussing acute grief reaction signs and symptoms, and providing support and validation regarding patient's responses to the loss and other life events and that she is experiencing complicated grief.  Allowed patient to ventilate regarding her experiences that triggered recent grief reaction.  Also counseled patient that therapy would likely be beneficial and patient is amenable to starting therapy.  Informed patient that this provider would speak with possible therapists and would make a referral as soon as possible.  Case staffed with Rinaldo Cloud, LCSW who agrees to see patient.  Discussed with patient that due to time constraints, more time is needed to obtain detailed history and further symptom review.  Discussed scheduling an additional visit to obtain remaining history and to discuss medication  options.  Patient is amenable to this plan.    Thayer Headings, PMHNP

## 2018-10-13 ENCOUNTER — Encounter: Payer: Self-pay | Admitting: Psychiatry

## 2018-10-14 ENCOUNTER — Encounter: Payer: Self-pay | Admitting: Psychiatry

## 2018-10-14 ENCOUNTER — Ambulatory Visit (INDEPENDENT_AMBULATORY_CARE_PROVIDER_SITE_OTHER): Payer: Medicare Other | Admitting: Psychiatry

## 2018-10-14 VITALS — BP 144/89 | HR 77

## 2018-10-14 DIAGNOSIS — F411 Generalized anxiety disorder: Secondary | ICD-10-CM

## 2018-10-14 DIAGNOSIS — F4321 Adjustment disorder with depressed mood: Secondary | ICD-10-CM

## 2018-10-14 MED ORDER — SERTRALINE HCL 50 MG PO TABS: ORAL_TABLET | ORAL | 1 refills | Status: DC

## 2018-10-14 NOTE — Progress Notes (Signed)
Melissa [name] [id number] Sep 01, 1930 85 y.o.  Subjective:   Patient ID:  Melissa [name] is a 85 y.o. (DOB August 01, 1931) female.  Chief Complaint:  Chief Complaint  Patient presents with  . Anxiety  . Depression    HPI [name] presents to the office today for follow-up of Anxiety and depression.  She reports crying frequently. She reports that "I'm the kind of person where I can't let go of things." Reports rumination about husband's death and the things she has learned since husband's death. She reports that the only time that she has been able to stop thinking about these things was when she traveled with her daughter and granddaughter.  She reports, "I'm a worrier." Reports long-standing worry and anxiety since childhood. She reports that she may experience some headaches with anxiety. Occasional increased heart rate with anxiety.  Reports that she cries when anxious. Reports that she has had some long-standing anxiety with driving, typically with hills, curves, and highways. She reports that she was initially prescribed Xanax to help with anxiety with driving and will take it if she anticipates these triggers. She reports that her daughter will find out the topography of a route before pt will drive. Describes worrying about her family members. Denies panic attacks. Reports that she had a pain in her chest yesterday when having anxiety when trying to settle husband's affairs and talking to investors. She reports that she had decreased appetite and lost 24 lbs after her husband's death. She reports that her appetite has improved and she has gained some of the weight she has lost. Reports that both of her children were encouraging her to eat and that daughter would come on her lunch break to ensure she was eating enough.   Denies any social anxiety. She denies needing to have things in a certain order. Reports infrequent checking behaviors. Reports intrusive memories. Denies  nightmares. Denies re-experiencing. Reports some hyper-vigilance and exaggerated startle response.   She reports persistent sad mood and denies depressed mood prior to her son's death. Reports feelings of guilt and feeling like she is a burden. Also reports some guilt about not being with husband at the time of his death. She reports that her sleep has improved with medication from PCP. She reports occasional difficulty falling asleep. Reports that her primary difficulty with sleep is middle of the night awakenings- "I re-hash this over and over" and cannot return to sleep some nights or it takes several hours to return to sleep. Estimates sleeping about 4 hours a night. She reports low energy and motivation. She reports that her concentration and memory is not as good since husband's death. Reports that she will frequently misplace objects. Reports that she has had more difficulty spelling and reading. Denies current SI. Reports that she initially had some SI.   Denies any past manic s/s. Denies any AH or VH. Denies paranoia.   Raised by her mother after her father left when pt and her 2 siblings were young. Reports that she has family support here with daughter. Reports that the friends she had in [place] are now deceased and many of her friends and support outside of family are in [place]. Reports that she has a son and a daughter. Son lives in the [place]. Son has worked as an Technical sales engineer and now works as an Midwife. Reports that she is very close with both of her children. Daughter is in the process of looking for a house with mother-in-law quarters. Daughter and son-in-law help  with their grandchildren. Daughter has 2 daughters, one is an [facility] and another daughter has 2 children and has never married. Reports that son has a daughter that he worries about frequently who was a Forensic psychologist and had to have hip replacements. Reports that her children are close.   Past Psychiatric Treatment:  Reports that PCP  referred her to a psychiatrist and that she primarily talked about her dislike of having to live a public life and frequently attend social events with her husband's career.   Past Psychiatric Medication Trials: Olanzapine Alprazolam- uses as needed for anxiety   Review of Systems:  Review of Systems  Musculoskeletal: Positive for arthralgias, back pain and myalgias. Negative for gait problem.  Neurological: Positive for numbness. Negative for tremors.  Psychiatric/Behavioral:       Please refer to HPI    Medications: I have reviewed the patient's current medications.  Current Outpatient Medications  Medication Sig Dispense Refill  . acetaminophen (TYLENOL) 325 MG tablet Take 250 mg by mouth every 6 (six) hours as needed.    . ALPRAZolam (XANAX) 0.5 MG tablet One po q am prn anxiety 30 tablet 1  . atorvastatin (LIPITOR) 40 MG tablet TAKE 1 TABLET BY MOUTH DAILY 90 tablet 3  . cholecalciferol (VITAMIN D) 1000 units tablet Take 1,000 Units by mouth daily.    . magnesium 30 MG tablet Take 250 mg by mouth daily.    Marland Kitchen OLANZapine (ZYPREXA) 2.5 MG tablet Take 1 tablet (2.5 mg total) by mouth at bedtime. 30 tablet 5  . Omega-3 Fatty Acids (FISH OIL) 1200 MG CAPS Take 1,200 mg by mouth daily.    . valsartan-hydrochlorothiazide (DIOVAN-HCT) 80-12.5 MG tablet TAKE 1 TABLET BY MOUTH EVERY DAY 90 tablet 0  . vitamin B-12 (CYANOCOBALAMIN) 250 MCG tablet Take 250 mcg by mouth daily.    . vitamin E 1000 UNIT capsule Take 1,000 Units by mouth daily.    . sertraline (ZOLOFT) 50 MG tablet Take 1/2 tab po qd x 5-7 days, then increase to 1 tab po qd 30 tablet 1   No current facility-administered medications for this visit.     Medication Side Effects: None  Allergies:  Allergies  Allergen Reactions  . Aspirin Other (See Comments)    Nausea, dizziness, malaise.  . Aspirin Nausea Only  . Codeine   . Tramadol     Past Medical History:  Diagnosis Date  . Allergy   . Anxiety   . Bladder  dysfunction   . Bulging lumbar disc   . Depression   . Hyperlipidemia   . Hypertension   . Low back pain   . Lumbar radiculopathy   . Spinal stenosis     Family History  Problem Relation Age of Onset  . Arthritis Mother   . Alcohol abuse Father   . Anxiety disorder Sister   . Anxiety disorder Niece   . Anxiety disorder Granddaughter     Social History   Socioeconomic History  . Marital status: Widowed    Spouse name: Not on file  . Number of children: Not on file  . Years of education: Not on file  . Highest education level: Not on file  Occupational History  . Not on file  Social Needs  . Financial resource strain: Not on file  . Food insecurity:    Worry: Not on file    Inability: Not on file  . Transportation needs:    Medical: Not on file    Non-medical:  Not on file  Tobacco Use  . Smoking status: Never Smoker  . Smokeless tobacco: Never Used  Substance and Sexual Activity  . Alcohol use: Yes    Comment: Reports 1/2 glass of wine periodically  . Drug use: Never  . Sexual activity: Not on file  Lifestyle  . Physical activity:    Days per week: Not on file    Minutes per session: Not on file  . Stress: Not on file  Relationships  . Social connections:    Talks on phone: Not on file    Gets together: Not on file    Attends religious service: Not on file    Active member of club or organization: Not on file    Attends meetings of clubs or organizations: Not on file    Relationship status: Not on file  . Intimate partner violence:    Fear of current or ex partner: Not on file    Emotionally abused: Not on file    Physically abused: Not on file    Forced sexual activity: Not on file  Other Topics Concern  . Not on file  Social History Narrative   ** Merged History Encounter **        Past Medical History, Surgical history, Social history, and Family history were reviewed and updated as appropriate.   Please see review of systems for further  details on the patient's review from today.   Objective:   Physical Exam:  BP (!) 144/89   Pulse 77   Physical Exam Constitutional:      General: She is not in acute distress.    Appearance: She is well-developed.  Musculoskeletal:        General: No deformity.  Neurological:     Mental Status: She is alert and oriented to person, place, and time.     Coordination: Coordination normal.  Psychiatric:        Attention and Perception: Attention and perception normal. She does not perceive auditory or visual hallucinations.        Mood and Affect: Mood is anxious and depressed. Affect is blunt. Affect is not labile, angry or inappropriate.        Speech: Speech normal.        Behavior: Behavior normal.        Thought Content: Thought content normal. Thought content does not include homicidal or suicidal ideation. Thought content does not include homicidal or suicidal plan.        Cognition and Memory: Cognition and memory normal.        Judgment: Judgment normal.     Comments: Insight intact. No delusions.      Lab Review:     Component Value Date/Time   NA 138 02/23/2018 1047   K 4.8 02/23/2018 1047   CL 101 02/23/2018 1047   CO2 28 02/23/2018 1047   GLUCOSE 94 02/23/2018 1047   BUN 23 02/23/2018 1047   CREATININE 0.93 (H) 02/23/2018 1047   CALCIUM 10.3 02/23/2018 1047   PROT 7.5 02/23/2018 1047   AST 16 02/23/2018 1047   ALT 14 02/23/2018 1047   BILITOT 1.1 02/23/2018 1047   GFRNONAA 57 (L) 02/23/2018 1047   GFRAA 66 02/23/2018 1047       Component Value Date/Time   WBC 4.6 02/23/2018 1047   RBC 4.84 02/23/2018 1047   HGB 13.3 02/23/2018 1047   HCT 39.9 02/23/2018 1047   PLT 217 02/23/2018 1047   MCV 82.4 02/23/2018 1047  MCH 27.5 02/23/2018 1047   MCHC 33.3 02/23/2018 1047   RDW 13.3 02/23/2018 1047   LYMPHSABS 1,808 02/23/2018 1047   EOSABS 336 02/23/2018 1047   BASOSABS 60 02/23/2018 1047    No results found for: POCLITH, LITHIUM   No results found  for: PHENYTOIN, PHENOBARB, VALPROATE, CBMZ   .res Assessment: Plan:   Pt seen for 45 minutes and greater than 50% of visit spent counseling pt to include discussing complicated grief, anxiety, and depression. Counseled pt re: s/s of depression. Discussed potential benefits, risks, and side effects of Sertraline and also proposed mechanism of action. Discussed starting with low dose and gradually titration to lowest possible effective dose.  Will start sertraline 50 mg 1/2 tablet daily for 5 to 7 days, then increase to 1 tablet daily for anxiety and depression.  Recommended patient continue all other medications as prescribed.  Discussed potential benefits of therapy and patient was assisted with scheduling appointment to see Rockne Menghini, LCSW.  Patient to follow-up with this provider in 4 to 5 weeks or sooner if clinically indicated.  Grief reaction - Plan: sertraline (ZOLOFT) 50 MG tablet  Generalized anxiety disorder - Plan: sertraline (ZOLOFT) 50 MG tablet  Please see After Visit Summary for patient specific instructions.  Future Appointments  Date Time Provider Department Center  10/18/2018  9:45 AM Baxley, Luanna Cole, MD [facility]  11/04/2018 10:00 AM Mathis Fare, LCSW [facility] None  11/16/2018 11:00 AM Corie Chiquito, PMHNP [facility] None    No orders of the defined types were placed in this encounter.     -------------------------------

## 2018-10-18 ENCOUNTER — Other Ambulatory Visit: Payer: Self-pay

## 2018-10-18 ENCOUNTER — Encounter: Payer: Self-pay | Admitting: Internal Medicine

## 2018-10-18 ENCOUNTER — Ambulatory Visit (INDEPENDENT_AMBULATORY_CARE_PROVIDER_SITE_OTHER): Payer: Medicare Other | Admitting: Internal Medicine

## 2018-10-18 VITALS — BP 130/80 | HR 88 | Ht <= 58 in | Wt 132.0 lb

## 2018-10-18 DIAGNOSIS — F5102 Adjustment insomnia: Secondary | ICD-10-CM

## 2018-10-18 DIAGNOSIS — I10 Essential (primary) hypertension: Secondary | ICD-10-CM

## 2018-10-18 DIAGNOSIS — E7800 Pure hypercholesterolemia, unspecified: Secondary | ICD-10-CM | POA: Diagnosis not present

## 2018-10-18 DIAGNOSIS — F4321 Adjustment disorder with depressed mood: Secondary | ICD-10-CM | POA: Diagnosis not present

## 2018-10-18 DIAGNOSIS — F411 Generalized anxiety disorder: Secondary | ICD-10-CM | POA: Diagnosis not present

## 2018-10-18 LAB — HEPATIC FUNCTION PANEL
AG Ratio: 1.7 (calc) (ref 1.0–2.5)
ALKALINE PHOSPHATASE (APISO): 63 U/L (ref 37–153)
ALT: 20 U/L (ref 6–29)
AST: 23 U/L (ref 10–35)
Albumin: 4.3 g/dL (ref 3.6–5.1)
BILIRUBIN INDIRECT: 1.1 mg/dL (ref 0.2–1.2)
Bilirubin, Direct: 0.3 mg/dL — ABNORMAL HIGH (ref 0.0–0.2)
Globulin: 2.5 g/dL (calc) (ref 1.9–3.7)
TOTAL PROTEIN: 6.8 g/dL (ref 6.1–8.1)
Total Bilirubin: 1.4 mg/dL — ABNORMAL HIGH (ref 0.2–1.2)

## 2018-10-18 LAB — LIPID PANEL
Cholesterol: 193 mg/dL (ref <200)
HDL: 84 mg/dL (ref >=50)
LDL CHOLESTEROL (CALC): 94 mg/dL
Non-HDL Cholesterol (Calc): 109 mg/dL (calc) (ref <130)
TRIGLYCERIDES: 64 mg/dL (ref <150)
Total CHOL/HDL Ratio: 2.3 (calc) (ref <5.0)

## 2018-10-18 MED ORDER — VALSARTAN-HYDROCHLOROTHIAZIDE 80-12.5 MG PO TABS: 1.0000 | ORAL_TABLET | Freq: Every day | ORAL | 3 refills | Status: DC

## 2018-10-18 MED ORDER — OLANZAPINE 5 MG PO TABS: 5.0000 mg | ORAL_TABLET | Freq: Every day | ORAL | 5 refills | Status: DC

## 2018-10-19 ENCOUNTER — Ambulatory Visit (INDEPENDENT_AMBULATORY_CARE_PROVIDER_SITE_OTHER): Payer: Medicare Other | Admitting: Psychiatry

## 2018-10-19 DIAGNOSIS — F4321 Adjustment disorder with depressed mood: Secondary | ICD-10-CM

## 2018-10-19 DIAGNOSIS — F411 Generalized anxiety disorder: Secondary | ICD-10-CM

## 2018-10-19 NOTE — Progress Notes (Signed)
[facility] Counselor Initial Adult Exam  Name: Melissa [name] Date: 10/19/2018 MRN: [id number] DOB: 06-29-1931 PCP: Margaree Mackintosh, MD  Time spent:  60 minutes   Guardian/Payee:  patient    Paperwork requested:  No   Reason for Visit /Presenting Problem:  Grief reaction, generalized anxiety  Mental Status Exam:   Appearance:   Neat     Behavior:  Appropriate and Sharing  Motor:  Normal  Speech/Language:   Clear and Coherent  Affect:  Congruent  Mood:  anxious  Thought process:  normal  Thought content:    WNL  Sensory/Perceptual disturbances:    WNL  Orientation:  oriented to person, place, time/date, situation, day of week, month of year and year  Attention:  Good  Concentration:  Poor, per patient  Memory:  Immediate;   Fair Recent;   Fair  Fund of knowledge:   Good  Insight:    Good  Judgment:   Good  Impulse Control:  Good   Reported Symptoms:  Anxious, some depression, sad, angry.   "Anxiety runs in my family."  Guilt.  Risk Assessment: Danger to Self:  No Self-injurious Behavior: No Danger to Others: No Duty to Warn:no Physical Aggression / Violence:No  Access to Firearms a concern: No  Gang Involvement:No  Patient / guardian was educated about steps to take if suicide or homicide risk level increases between visits:   Denies any SI / HI. While future psychiatric events cannot be accurately predicted, the patient does not currently require acute inpatient psychiatric care and does not currently meet [facility] involuntary commitment criteria.  Substance Abuse History: Current substance abuse: No     Past Psychiatric History:   Previous psychological history is significant for anxiety and depression Outpatient Providers:" I saw a psychiatrist many yrs ago for 1 appt" History of Psych Hospitalization: No  Psychological Testing: none   Abuse History: Victim of No., none   Report needed: No. Victim of Neglect:No. Perpetrator of none  Witness /  Exposure to Domestic Violence: No   Protective Services Involvement: No  Witness to [employer] Violence:  No   Family History:  Family History  Problem Relation Age of Onset  . Arthritis Mother   . Alcohol abuse Father   . Anxiety disorder Sister   . Anxiety disorder Niece   . Anxiety disorder Granddaughter     Living situation: the patient lives alone in her own townhome  Sexual Orientation:  Straight  Relationship Status: widowed  Name of spouse / other:  N/A             If a parent, number of children / ages: 1 adult daughter, age 83 and 1 adult son, age 83  Support Systems;  Adult children, friends in [place], moved to [place] 3 yrs ago from [place].  Financial Stress:  No   Income/Employment/Disability: SS, pension from [employer], pension from [employer].  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 History of legal issue / charges: N/A  Medical History/Surgical History:   REVIEWED with patient the  info below and she states all is correct except she is no longer taking Valsartan. Past Medical History:  Diagnosis Date  . Allergy   . Anxiety   . Bladder dysfunction   . Bulging lumbar disc   . Depression   . Hyperlipidemia   . Hypertension   . Low back pain   . Lumbar radiculopathy   . Spinal stenosis     Past Surgical History:  Procedure Laterality Date  . bladder dysfunction    . BREAST CYST EXCISION    . CATARACT EXTRACTION      Medications: Current  Outpatient Medications  Medication Sig Dispense Refill  . acetaminophen (TYLENOL) 325 MG tablet Take 250 mg by mouth every 6 (six) hours as needed.    . ALPRAZolam (XANAX) 0.5 MG tablet One po q am prn anxiety 30 tablet 1  . atorvastatin (LIPITOR) 40 MG tablet TAKE 1 TABLET BY MOUTH DAILY 90 tablet 3  . cholecalciferol (VITAMIN D) 1000 units tablet Take 1,000 Units by mouth daily.    . magnesium 30 MG tablet Take 250 mg by mouth daily.    Marland Kitchen OLANZapine (ZYPREXA) 5 MG tablet Take 1 tablet (5 mg total) by mouth at bedtime. 30 tablet 5  . Omega-3 Fatty Acids (FISH OIL) 1200 MG CAPS Take 1,200 mg by mouth daily.    . sertraline (ZOLOFT) 50 MG tablet Take 1/2 tab po qd x 5-7 days, then increase to 1 tab po qd 30 tablet 1  . valsartan-hydrochlorothiazide (DIOVAN-HCT) 80-12.5 MG tablet Take 1 tablet by mouth daily. 90 tablet 3  . vitamin B-12 (CYANOCOBALAMIN) 250 MCG tablet Take 250 mcg by mouth daily.    . vitamin E 1000 UNIT capsule Take 1,000 Units by mouth daily.     No current facility-administered medications for this visit.     Allergies  Allergen Reactions  . Aspirin Other (See Comments)    Nausea, dizziness, malaise.  . Aspirin Nausea Only  . Codeine   . Tramadol     Diagnoses:    ICD-10-CM   1. Grief reaction F43.21   2. Generalized anxiety disorder F41.1     Plan of Care:   Patient very anxious. Reassurance offered as well as pointed out several of her strengths as well as communicated the belief that she can work on her goals and get better.  Will see patient in individual therapy 1-2 week intervals focused on her goals.  Began treatment goal plan today (see Flowsheet section). To return in 1-2 weeks.   Mathis Fare, LCSW

## 2018-10-27 ENCOUNTER — Ambulatory Visit (INDEPENDENT_AMBULATORY_CARE_PROVIDER_SITE_OTHER): Payer: Medicare Other | Admitting: Psychiatry

## 2018-10-27 ENCOUNTER — Other Ambulatory Visit: Payer: Self-pay

## 2018-10-27 DIAGNOSIS — F4321 Adjustment disorder with depressed mood: Secondary | ICD-10-CM

## 2018-10-27 NOTE — Progress Notes (Signed)
      [facility] Counselor/Therapist Progress Note  Patient ID: Melissa [name], MRN: [id number],    Date: 10/27/2018  Time Spent: 60 minutes  Treatment Type: Individual Therapy  Reported Symptoms: grief, some anger, sadness  Mental Status Exam:  Appearance:   Well Groomed     Behavior:  Appropriate and Sharing  Motor:  Normal  Speech/Language:   Clear and Coherent  Affect:  Congruent  Mood:  anxious and depressed  Thought process:  normal  Thought content:    WNL  Sensory/Perceptual disturbances:    WNL  Orientation:  oriented to person, place, time/date, situation, day of week, month of year and year  Attention:  Good  Concentration:  Good  Memory:  Patient reports some short-term memory issues  Fund of knowledge:   Good  Insight:    Good  Judgment:   Good  Impulse Control:  Good   Risk Assessment: Danger to Self:  No Self-injurious Behavior: No Danger to Others: No Duty to Warn:no Physical Aggression / Violence:No  Access to Firearms a concern: No Gang Involvement:No   Subjective:   Reports she has been able to get some better sleep, some less daily stress, "people tell me they feel I am better".  Followed up from our first session where patient shared lots of painful info and established some initial goals.   Today worked intentionally on patient's main presenting problem involving the death of her husband and another private issue that is really troubling patient.  Difficult for patient to share initially but became more open as she shared.  Patient confronting her grief and sadness as well as other issues involving trust, disappointment, and guilt. Expresses her feelings and emotions as she speaks openly her distrust and hurt. Still having some difficulty "letting go" in order to move forward however is making some progress.    Talked and demonstrated strategies in session to help patient with "letting go" and anxiety-reduction, and in believing in  herself.  Interventions: Solution-Oriented/Positive Psychology and Cognitive Behavioral Therapy  Diagnosis:   ICD-10-CM   1. Grief reaction F43.21     Plan:  Patient to follow up with CBT strategies discussed in session for homework---will review at session next week.  Is some calmer and she is working hard to maintain even small gains, especially in her (more) positive self-talk and follow through on her goals.  Melissa Fare, LCSW

## 2018-11-04 ENCOUNTER — Ambulatory Visit: Payer: Medicare Other | Admitting: Psychiatry

## 2018-11-06 NOTE — Patient Instructions (Signed)
Increase Zyprexa to 5 mg daily at bedtime for insomnia.  Continue counseling.  Continue current medications and follow-up with physical exam and Medicare annual wellness visit July 2020.

## 2018-11-06 NOTE — Progress Notes (Addendum)
   Subjective:    Patient ID: Melissa [name], female    DOB: 1930-12-30, 85 y.o.   MRN: [id number]  HPI She is here today to follow-up on anxiety.  Still having issues sleeping.  She did go see a Warden/ranger.  She talked at length about that today and about her grief reaction with her husband's death.  Is disturbed about some of the things she has learned about her husband over the past few weeks.  Has seen Dr. Ethelene Hal for back pain.  Had epidural steroid injection but still having pain.  Review of Systems see above     Objective:   Physical Exam  Spent 25 minutes with her today speaking about these issues.  All in all I do think she is somewhat better.  She is considering moving in with her daughter.  Daughter checks on her frequently.      Assessment & Plan:  Anxiety-continue Xanax every morning  Grief reaction currently seeing counselor/psychologist  Insomnia-increase Zyprexa to 5 mg at bedtime  Hyperlipidemia-total bilirubin slightly elevated at 1.4.  Lipid panel is normal.  Continue Lipitor 40 mg daily  Plan: Zoloft 50 mg daily.  Xanax 0.5 mg p.o. every morning as needed anxiety.  Increase Zyprexa from 2.5 mg to 5 mg at bedtime.  Follow-up in July at which time she will be due for annual Medicare wellness visit and complete physical exam.  25 minutes spent with patient

## 2018-11-09 ENCOUNTER — Ambulatory Visit: Payer: Medicare Other | Admitting: Psychiatry

## 2018-11-16 ENCOUNTER — Ambulatory Visit: Payer: Medicare Other | Admitting: Psychiatry

## 2018-11-20 ENCOUNTER — Other Ambulatory Visit: Payer: Self-pay | Admitting: Internal Medicine

## 2018-11-22 ENCOUNTER — Telehealth: Payer: Self-pay | Admitting: Internal Medicine

## 2018-11-22 ENCOUNTER — Encounter: Payer: Self-pay | Admitting: Internal Medicine

## 2018-11-22 MED ORDER — ALPRAZOLAM 0.5 MG PO TABS: ORAL_TABLET | ORAL | 2 refills | Status: DC

## 2018-11-22 NOTE — Telephone Encounter (Signed)
Xanax refill phones in yesterday and was resent by e-scribe today

## 2018-11-22 NOTE — Telephone Encounter (Signed)
Called this in yesterday.

## 2018-12-05 ENCOUNTER — Other Ambulatory Visit: Payer: Self-pay

## 2018-12-05 ENCOUNTER — Ambulatory Visit (INDEPENDENT_AMBULATORY_CARE_PROVIDER_SITE_OTHER): Payer: Medicare Other | Admitting: Psychiatry

## 2018-12-05 DIAGNOSIS — F4321 Adjustment disorder with depressed mood: Secondary | ICD-10-CM

## 2018-12-05 NOTE — Progress Notes (Signed)
[facility] Counselor/Therapist Progress Note  Patient ID: Melissa [name], MRN: [id number],    Date: 12/05/2018  Time Spent: 60 minutes    10:00am to 11:00am  Treatment Type: Individual Therapy (In Person)  Reported Symptoms: grief, some anger, sadness, anxiety, guilt, depression ("but better")  Mental Status Exam:  Appearance:   Well Groomed     Behavior:  Appropriate and Sharing  Motor:  Normal  Speech/Language:   Clear and Coherent  Affect:  Congruent  Mood:  anxious and depressed  Thought process:  normal  Thought content:    WNL  Sensory/Perceptual disturbances:    WNL  Orientation:  oriented to person, place, time/date, situation, day of week, month of year and year  Attention:  Good  Concentration:  Good  Memory:  Patient reports some short-term memory issues  Fund of knowledge:   Good  Insight:    Good  Judgment:   Good  Impulse Control:  Good   Risk Assessment: Danger to Self:  No Self-injurious Behavior: No Danger to Others: No Duty to Warn:no Physical Aggression / Violence:No  Access to Firearms a concern: No Gang Involvement:No   Subjective:  Patient in today with worsening symptoms named above after having not been seen recently, as we are not currently having patients in the office due to Covid-19.  She was not able to meet the Medicare requirement of having a video component of our session so therefore Medicare would not cover the session.  Patient states "I don't have video on my phone and don't know how to do it anyway.  I need to come here to office to get helped."   Patient very distressed over issue that occurred with her deceased husband, another friend, and patient.  "I can't get it out of my head". Unfortunately, "I'm the type of person who doesn't let go of things that bother me, but I know I need to do so." We discussed the "letting go" process and how difficult it can be, and also the result when we don't "let go" of negative things that  impact us.   Her sleep is some better but on some nights she wakes up during the night "but not often".  Patient states "my depression medicine is helping some." Her insight has improved some. Her eating is better and has gained some of the weight back that she had lost earlier. She is experiencing anxiety and depression which has worsened, she says, and this seems to be accurate without treatment. Denies any suicidal ideation but admits she did experience SI "back when all this began." Discussed some strategies for her to begin immediately to help her work towards letting go of negative situations and things that have happened in the past, in order to move forward.     Worked intentionally on patient's "main presenting problem" involving the death of her husband and another private issue that is really troubling patient.  Difficult for patient to share initially but became more open as she shared.  Patient confronting her grief and sadness as well as other issues involving disappointment and guilt.   Talked and demonstrated strategies in session to help patient with "letting go" and anxiety-reduction, decreasing depression, building resilience, and in believing in herself.  Interventions: Solution-Oriented/Positive Psychology and Cognitive Behavioral Therapy  Diagnosis:   ICD-10-CM   1. Grief reaction F43.21     Plan:  Patient to follow up with CBT strategies discussed in session for homework---will review at session next week.  Is some calmer and she is working hard to maintain even small gains, especially in her (more) positive self-talk and follow through on her goals.  Mathis Fare, LCSW

## 2018-12-08 ENCOUNTER — Other Ambulatory Visit: Payer: Self-pay

## 2018-12-08 ENCOUNTER — Ambulatory Visit (INDEPENDENT_AMBULATORY_CARE_PROVIDER_SITE_OTHER): Payer: Medicare Other | Admitting: Psychiatry

## 2018-12-08 ENCOUNTER — Encounter: Payer: Self-pay | Admitting: Psychiatry

## 2018-12-08 VITALS — Wt 137.0 lb

## 2018-12-08 DIAGNOSIS — F411 Generalized anxiety disorder: Secondary | ICD-10-CM | POA: Diagnosis not present

## 2018-12-08 DIAGNOSIS — F4321 Adjustment disorder with depressed mood: Secondary | ICD-10-CM

## 2018-12-08 MED ORDER — SERTRALINE HCL 100 MG PO TABS: 100.0000 mg | ORAL_TABLET | Freq: Every day | ORAL | 1 refills | Status: DC

## 2018-12-08 NOTE — Progress Notes (Signed)
Melissa [name] [id number] Apr 07, 1931 85 y.o.  Virtual Visit via Telephone Note  I connected with@ on 12/08/18 at 10:30 AM EDT by telephone and verified that I am speaking with the correct person using two identifiers.   I discussed the limitations, risks, security and privacy concerns of performing an evaluation and management service by telephone and the availability of in person appointments. I also discussed with the patient that there may be a patient responsible charge related to this service. The patient expressed understanding and agreed to proceed.   I discussed the assessment and treatment plan with the patient. The patient was provided an opportunity to ask questions and all were answered. The patient agreed with the plan and demonstrated an understanding of the instructions.   The patient was advised to call back or seek an in-person evaluation if the symptoms worsen or if the condition fails to improve as anticipated.  I provided 30 minutes of non-face-to-face time during this encounter.  The patient was located at home.  The provider was located at home.   Corie Chiquito, PMHNP   Subjective:   Patient ID:  Melissa [name] is a 85 y.o. (DOB 27-Mar-1931) female.  Chief Complaint:  Chief Complaint  Patient presents with  . Anxiety  . Depression  . Sleeping Problem    HPI Melissa [name] presents for follow-up of depression and anxiety. She reports that she has "good days and bad days." She reports that she continues to have feelings of guilt that she was not with her husband at the time of his death. She describes continued rumination about certain events in the past. She reports that she is using different strategies to re-direct her thoughts.  She reports awakening in the middle of the night with anxious thoughts and is unable to return sleep. She reports that she is sleeping about 7 hours a night and occasionally napping.  She reports "I have gotten better." She  reports that she does not cry as much as she used to. She reports that her anxiety has improved some.She reports that "somedays" she feels sad and other days she does not. She reports that sad days are "less" than before.   She reports that she continues to have moments where she has difficulty expressing herself and will write the wrong letter or "get tongue tied." She reports, "I tire very easily." She reports that her motivation has been ok and is able to do the things she needs to do. She reports that her appetite is good. Denies any current SI.   Anniversary of husband's death is in 22-Jan-2023. She reports that she is trying to get affairs and paperwork completed.   She reports that she will often take only one 1/2 of alprazolam some days.  Review of Systems:  Review of Systems  Musculoskeletal: Positive for back pain. Negative for gait problem.       Reports pain in her legs.   Neurological: Negative for tremors.  Psychiatric/Behavioral:       Please refer to HPI    Medications: I have reviewed the patient's current medications.  Current Outpatient Medications  Medication Sig Dispense Refill  . acetaminophen (TYLENOL) 325 MG tablet Take 250 mg by mouth every 6 (six) hours as needed.    . ALPRAZolam (XANAX) 0.5 MG tablet TAKE 1/2 TABLET to One tablet BY MOUTH EVERY MORNING AND  One half to one table  BY MOUTH QHS 60 tablet 2  . atorvastatin (LIPITOR) 40 MG tablet TAKE 1 TABLET  BY MOUTH DAILY 90 tablet 3  . cholecalciferol (VITAMIN D) 1000 units tablet Take 1,000 Units by mouth daily.    . magnesium 30 MG tablet Take 250 mg by mouth daily.    . naproxen sodium (ALEVE) 220 MG tablet Take 220 mg by mouth.    . OLANZapine (ZYPREXA) 5 MG tablet Take 1 tablet (5 mg total) by mouth at bedtime. 30 tablet 5  . sertraline (ZOLOFT) 50 MG tablet Take 1/2 tab po qd x 5-7 days, then increase to 1 tab po qd 30 tablet 1  . TURMERIC PO Take by mouth.    . valsartan-hydrochlorothiazide (DIOVAN-HCT) 80-12.5  MG tablet Take 1 tablet by mouth daily. 90 tablet 3  . vitamin B-12 (CYANOCOBALAMIN) 250 MCG tablet Take 250 mcg by mouth daily.    . vitamin E 1000 UNIT capsule Take 1,000 Units by mouth daily.    . Omega-3 Fatty Acids (FISH OIL) 1200 MG CAPS Take 1,200 mg by mouth daily.    . sertraline (ZOLOFT) 100 MG tablet Take 1 tablet (100 mg total) by mouth daily for 30 days. 30 tablet 1   No current facility-administered medications for this visit.     Medication Side Effects: None  Allergies:  Allergies  Allergen Reactions  . Aspirin Other (See Comments)    Nausea, dizziness, malaise.  . Aspirin Nausea Only  . Codeine   . Tramadol     Past Medical History:  Diagnosis Date  . Allergy   . Anxiety   . Bladder dysfunction   . Bulging lumbar disc   . Depression   . Hyperlipidemia   . Hypertension   . Low back pain   . Lumbar radiculopathy   . Spinal stenosis     Family History  Problem Relation Age of Onset  . Arthritis Mother   . Alcohol abuse Father   . Anxiety disorder Sister   . Anxiety disorder Niece   . Anxiety disorder Granddaughter     Social History   Socioeconomic History  . Marital status: Widowed    Spouse name: Not on file  . Number of children: Not on file  . Years of education: Not on file  . Highest education level: Not on file  Occupational History  . Not on file  Social Needs  . Financial resource strain: Not on file  . Food insecurity:    Worry: Not on file    Inability: Not on file  . Transportation needs:    Medical: Not on file    Non-medical: Not on file  Tobacco Use  . Smoking status: Never Smoker  . Smokeless tobacco: Never Used  Substance and Sexual Activity  . Alcohol use: Yes    Comment: Reports 1/2 glass of wine periodically  . Drug use: Never  . Sexual activity: Not on file  Lifestyle  . Physical activity:    Days per week: Not on file    Minutes per session: Not on file  . Stress: Not on file  Relationships  . Social  connections:    Talks on phone: Not on file    Gets together: Not on file    Attends religious service: Not on file    Active member of club or organization: Not on file    Attends meetings of clubs or organizations: Not on file    Relationship status: Not on file  . Intimate partner violence:    Fear of current or ex partner: Not on file  Emotionally abused: Not on file    Physically abused: Not on file    Forced sexual activity: Not on file  Other Topics Concern  . Not on file  Social History Narrative   ** Merged History Encounter **        Past Medical History, Surgical history, Social history, and Family history were reviewed and updated as appropriate.   Please see review of systems for further details on the patient's review from today.   Objective:   Physical Exam:  Wt 137 lb (62.1 kg)   BMI 28.63 kg/m   Physical Exam  Lab Review:     Component Value Date/Time   NA 138 02/23/2018 1047   K 4.8 02/23/2018 1047   CL 101 02/23/2018 1047   CO2 28 02/23/2018 1047   GLUCOSE 94 02/23/2018 1047   BUN 23 02/23/2018 1047   CREATININE 0.93 (H) 02/23/2018 1047   CALCIUM 10.3 02/23/2018 1047   PROT 6.8 10/18/2018 1046   AST 23 10/18/2018 1046   ALT 20 10/18/2018 1046   BILITOT 1.4 (H) 10/18/2018 1046   GFRNONAA 57 (L) 02/23/2018 1047   GFRAA 66 02/23/2018 1047       Component Value Date/Time   WBC 4.6 02/23/2018 1047   RBC 4.84 02/23/2018 1047   HGB 13.3 02/23/2018 1047   HCT 39.9 02/23/2018 1047   PLT 217 02/23/2018 1047   MCV 82.4 02/23/2018 1047   MCH 27.5 02/23/2018 1047   MCHC 33.3 02/23/2018 1047   RDW 13.3 02/23/2018 1047   LYMPHSABS 1,808 02/23/2018 1047   EOSABS 336 02/23/2018 1047   BASOSABS 60 02/23/2018 1047    No results found for: POCLITH, LITHIUM   No results found for: PHENYTOIN, PHENOBARB, VALPROATE, CBMZ   .res Assessment: Plan:   Discussed treatment options with patient.  Discussed that further titration of sertraline would  likely be helpful for mood and anxiety signs and symptoms since she has noticed a partial response at lower dose.  Will increase sertraline to 75 mg po qd x5 to 7 days, then increase to 100 mg po qd for anxiety and depression. Recommend continuing olanzapine and alprazolam as prescribed by PCP. Patient follow-up with this provider in 4 to 6 weeks or sooner if clinically indicated. Recommend continuing psychotherapy with Rockne Menghiniebbie Dowd, LCSW. Patient advised to contact office with any questions, adverse effects, or acute worsening in signs and symptoms.  Generalized anxiety disorder - Plan: sertraline (ZOLOFT) 100 MG tablet  Grief reaction - Plan: sertraline (ZOLOFT) 100 MG tablet  Please see After Visit Summary for patient specific instructions.  Future Appointments  Date Time Provider Department Center  12/14/2018  2:00 PM Mathis FareDowd, Deborah, [place] [facility] None  01/05/2019  9:30 AM Corie Chiquitoarter, Gia Lusher, PMHNP [facility] None  02/23/2019  9:00 AM Margaree MackintoshBaxley, [name], MD [facility] M[facility]  02/27/2019 11:00 AM Baxley, Luanna ColeMary J, MD [facility]    No orders of the defined types were placed in this encounter.     -------------------------------

## 2018-12-14 ENCOUNTER — Other Ambulatory Visit: Payer: Self-pay

## 2018-12-14 ENCOUNTER — Ambulatory Visit (INDEPENDENT_AMBULATORY_CARE_PROVIDER_SITE_OTHER): Payer: Medicare Other | Admitting: Psychiatry

## 2018-12-14 DIAGNOSIS — F411 Generalized anxiety disorder: Secondary | ICD-10-CM

## 2018-12-14 NOTE — Progress Notes (Addendum)
[facility] Counselor/Therapist Progress Note  Patient ID: Melissa [name], MRN: [id number],    Date: 12/14/2018  Time Spent: 60 minutes    2:00pm to 3:00pm  Treatment Type: Individual Therapy   Virtual Visit Note Connected with patient by a video enabled telemedicine/telehealth application or telephone, with their informed consent, and verified patient privacy and that I am speaking with the correct person using two identifiers. I discussed the limitations, risks, security and privacy concerns of performing psychotherapy and management service by telephone and the availability of in person appointments. I also discussed with the patient that there may be a patient responsible charge related to this service. The patient expressed understanding and agreed to proceed. I discussed the treatment planning with the patient. The patient was provided an opportunity to ask questions and all were answered. The patient agreed with the plan and demonstrated an understanding of the instructions. The patient was advised to call  our office if  symptoms worsen or feel they are in a crisis state and need immediate contact.   Therapist Location: [facility] Patient Location: home    Reported Symptoms: grief, some anger, sadness, anxiety, guilt, depression ("but better"), needing to work on some forgiveness issues  Mental Status Exam:  Appearance:     N/A   (telehealth)  Behavior:  Appropriate and Sharing  Motor:  Normal  Speech/Language:   Clear and Coherent  Affect:  N/A    (telehealth)  Mood:  anxious and depressed  Thought process:  normal  Thought content:    WNL  Sensory/Perceptual disturbances:    WNL  Orientation:  oriented to person, place, time/date, situation, day of week, month of year and year  Attention:  Good  Concentration:  Good  Memory:  Patient reports some short-term memory issues  Fund of knowledge:   Good  Insight:    Good  Judgment:   Good  Impulse  Control:  Good   Risk Assessment: Danger to Self:  No Self-injurious Behavior: No Danger to Others: No Duty to Warn:no Physical Aggression / Violence:No  Access to Firearms a concern: No Gang Involvement:No   Subjective:  Patient very distressed over issue that occurred with her deceased husband, another friend, and patient.  "I can't get it out of my head". Unfortunately, "I'm the type of person who doesn't let go of things that bother me, but I know I need to do so." We discussed the "letting go" process and how difficult it can be, and also the result when we don't "let go" of negative things that impact [place].   Insight has improved some re: her presenting issues. Her sleep is some better but on some nights she wakes up during the night "but not often".  Patient states "my depression medicine is still helping some."  Appetite better and has gained some of the weight back that she had lost earlier. Still experiencing anxiety and depression and feels that she is progressing some with her meds and in talking things out and working on goals. Denies any suicidal ideation.  Discussed some strategies to continue helping her work towards confronting and letting go of negative memories and situations re: things that have happened in the past, in order to move forward.     Worked intentionally on patient's "main presenting problem" involving the death of her husband and another private issue that is really troubling patient..  Patient confronting her grief and sadness as well as other issues involving disappointment and guilt.  Reviewed strategies in session to help patient with "letting go" and anxiety-reduction, decreasing depression, building resilience, and in believing in herself.   Interventions: Solution-Oriented/Positive Psychology and Cognitive Behavioral Therapy    Diagnosis:   ICD-10-CM   1. Generalized anxiety disorder F41.1     Plan:  Patient to follow up with CBT strategies  discussed in session for homework---will review at session next week.  Is some calmer and she is working hard to maintain even small gains, especially in her (more) positive self-talk and follow through on her goals.  Mathis Fareeborah Khaalid Lefkowitz, LCSW

## 2018-12-24 ENCOUNTER — Other Ambulatory Visit: Payer: Self-pay | Admitting: Psychiatry

## 2018-12-24 DIAGNOSIS — F411 Generalized anxiety disorder: Secondary | ICD-10-CM

## 2018-12-24 DIAGNOSIS — F4321 Adjustment disorder with depressed mood: Secondary | ICD-10-CM

## 2018-12-29 ENCOUNTER — Ambulatory Visit (INDEPENDENT_AMBULATORY_CARE_PROVIDER_SITE_OTHER): Payer: Medicare Other | Admitting: Psychiatry

## 2018-12-29 ENCOUNTER — Other Ambulatory Visit: Payer: Self-pay

## 2018-12-29 DIAGNOSIS — F411 Generalized anxiety disorder: Secondary | ICD-10-CM | POA: Diagnosis not present

## 2018-12-29 NOTE — Progress Notes (Signed)
[facility] Counselor/Therapist Progress Note  Patient ID: Melissa [name], MRN: [id number],    Date: 12/29/2018  Time Spent: 60 minutes    12:00 noon to 1:00pm  Treatment Type: Individual Therapy   Virtual Visit Note Connected with patient by a video enabled telemedicine/telehealth application or telephone, with their informed consent, and verified patient privacy and that I am speaking with the correct person using two identifiers. I discussed the limitations, risks, security and privacy concerns of performing psychotherapy and management service by telephone and the availability of in person appointments. I also discussed with the patient that there may be a patient responsible charge related to this service. The patient expressed understanding and agreed to proceed. I discussed the treatment planning with the patient. The patient was provided an opportunity to ask questions and all were answered. The patient agreed with the plan and demonstrated an understanding of the instructions. The patient was advised to call  our office if  symptoms worsen or feel they are in a crisis state and need immediate contact.   Therapist Location: [facility] Patient Location: home    Reported Symptoms: grief, some anger, sadness, anxiety, guilt, depression ("but better"), needing to work on some forgiveness issues  Mental Status Exam:  Appearance:     N/A   (telehealth)  Behavior:  Appropriate and Sharing  Motor:  N/A   (telehealth)  Speech/Language:   Clear and Coherent  Affect:  N/A    (telehealth)  Mood:  anxious and depressed  Thought process:  normal  Thought content:    WNL  Sensory/Perceptual disturbances:    WNL  Orientation:  oriented to person, place, time/date, situation, day of week, month of year and year  Attention:  Good  Concentration:  Fair  Memory:  Patient reports some short-term memory issues  Fund of knowledge:   Good  Insight:    Good  Judgment:   Good   Impulse Control:  Good   Risk Assessment: Danger to Self:  No Self-injurious Behavior: No Danger to Others: No Duty to Warn:no Physical Aggression / Violence:No  Access to Firearms a concern: No Gang Involvement:No   Subjective:  Patient shares her sadness initially as today is the anniversary of husband's death.  Openly shares her grief over the loss of husband, as well as other grief issues regarding patient and relationships. Still having some anxiety and depression also, and the depression has lessened some.  Talked more today about forgiveness issues, not in the sense that "everything in the situation is ok, but rather, patient is working to forgive and let go so she can eventually move forward in life and not have the weight of a negative experience on her back.  "Seem to have bad memories of events in early mornings when I wake up and also at times when I am alone and not occupied by reading, TV, or other interests such as word-search puzzles.  States that  "I'm the type of person who doesn't let go of things that bother me, but I know I need to do so." We reviewed the "letting go" process and how difficult it can be, and also the result when we don't "let go" of negative things that impact [place].    Some increased insight into presenting issues and sleep is some better. Feels her medication is helping and she is working on her goals for therapy. Appetite has increased and patient eating better per her report. Denies any SI, and does talk  freely in sessions. Discussed some strategies to continue helping her work towards confronting and letting go of negative memories and situations re: things that have happened in the past, in order to move forward.      Interventions: Solution-Oriented/Positive Psychology and Cognitive Behavioral Therapy    Diagnosis:   ICD-10-CM   1. Generalized anxiety disorder F41.1     Plan:  Patient to follow up with CBT strategies discussed in session for  homework---will review at session next week.  Is some calmer and she is working hard to maintain even small gains, especially in her (more) positive self-talk and follow through on her goals.  Mathis Fareeborah Tabitha Riggins, LCSW

## 2019-01-05 ENCOUNTER — Ambulatory Visit (INDEPENDENT_AMBULATORY_CARE_PROVIDER_SITE_OTHER): Payer: Medicare Other | Admitting: Psychiatry

## 2019-01-05 ENCOUNTER — Encounter: Payer: Self-pay | Admitting: Psychiatry

## 2019-01-05 ENCOUNTER — Other Ambulatory Visit: Payer: Self-pay

## 2019-01-05 DIAGNOSIS — F5105 Insomnia due to other mental disorder: Secondary | ICD-10-CM | POA: Diagnosis not present

## 2019-01-05 DIAGNOSIS — F99 Mental disorder, not otherwise specified: Secondary | ICD-10-CM

## 2019-01-05 DIAGNOSIS — F4321 Adjustment disorder with depressed mood: Secondary | ICD-10-CM | POA: Diagnosis not present

## 2019-01-05 DIAGNOSIS — F411 Generalized anxiety disorder: Secondary | ICD-10-CM

## 2019-01-05 MED ORDER — SERTRALINE HCL 100 MG PO TABS: 100.0000 mg | ORAL_TABLET | Freq: Every day | ORAL | 1 refills | Status: DC

## 2019-01-05 MED ORDER — MIRTAZAPINE 15 MG PO TABS: ORAL_TABLET | ORAL | 1 refills | Status: DC

## 2019-01-05 NOTE — Progress Notes (Signed)
[place] Minotti [id number] [birth year] 85 y.o.  Subjective:   Patient ID:  Melissa [name] is a 85 y.o. (DOB [birth year]) female.  Chief Complaint:  Chief Complaint  Patient presents with  . Insomnia  . Follow-up    Anxiety, depression    HPI Melissa [name] presents to the office today for follow-up of anxiety and depression. She reports that she will be moving soon. She reports "everything is moving so fast... I feel rushed." She reports "I am not sleeping." She reports that she stopped taking Olanzapine 2 days ago after reading about side effects and reports that it was not consistently helping with her sleep. She reports that her appetite has been good. She reports that her family thinks that she is improving. Reports that she is no longer having uncontrolled or unexplained crying episodes. Reports that she has cried at times after seeing something that triggers her grief.   She reports that she has been working word puzzles to help divert her thoughts. She reports some decrease in rumination about details surrounding husband's death and other issue. She reports "today I didn't feel anxious." Denies sad mood- "it's been ok I guess." She reports that her activity has been limited due to back pain "but the desire is there."  She reports difficulty retaining information- "I can't read, I can't spell." She reports some difficulty with concentration. Denies SI.   Past Psychiatric Medication Trials: Olanzapine Alprazolam- uses as needed for anxiety Sertraline   Review of Systems:  Review of Systems  Musculoskeletal: Positive for back pain. Negative for gait problem.       She reports that she has had some pain in her lower legs at night.   Neurological: Negative for tremors.  Psychiatric/Behavioral:       Please refer to HPI    Medications: I have reviewed the patient's current medications.  Current Outpatient Medications  Medication Sig Dispense Refill  . acetaminophen  (TYLENOL) 325 MG tablet Take 250 mg by mouth every 6 (six) hours as needed.    . ALPRAZolam (XANAX) 0.5 MG tablet TAKE 1/2 TABLET to One tablet BY MOUTH EVERY MORNING AND  One half to one table  BY MOUTH QHS 60 tablet 2  . atorvastatin (LIPITOR) 40 MG tablet TAKE 1 TABLET BY MOUTH DAILY 90 tablet 3  . cholecalciferol (VITAMIN D) 1000 units tablet Take 1,000 Units by mouth daily.    . magnesium 30 MG tablet Take 250 mg by mouth daily.    . mirtazapine (REMERON) 15 MG tablet Take 1/2-1 tab po QHS 30 tablet 1  . naproxen sodium (ALEVE) 220 MG tablet Take 220 mg by mouth.    . OLANZapine (ZYPREXA) 5 MG tablet Take 1 tablet (5 mg total) by mouth at bedtime. (Patient not taking: Reported on 01/05/2019) 30 tablet 5  . Omega-3 Fatty Acids (FISH OIL) 1200 MG CAPS Take 1,200 mg by mouth daily.    . sertraline (ZOLOFT) 100 MG tablet Take 1 tablet (100 mg total) by mouth daily for 30 days. 30 tablet 1  . TURMERIC PO Take by mouth.    . valsartan-hydrochlorothiazide (DIOVAN-HCT) 80-12.5 MG tablet Take 1 tablet by mouth daily. 90 tablet 3  . vitamin B-12 (CYANOCOBALAMIN) 250 MCG tablet Take 250 mcg by mouth daily.    . vitamin E 1000 UNIT capsule Take 1,000 Units by mouth daily.     No current facility-administered medications for this visit.     Medication Side Effects: None  Allergies:  Allergies  Allergen  Reactions  . Aspirin Other (See Comments)    Nausea, dizziness, malaise.  . Aspirin Nausea Only  . Codeine   . Tramadol     Past Medical History:  Diagnosis Date  . Allergy   . Anxiety   . Bladder dysfunction   . Bulging lumbar disc   . Depression   . Hyperlipidemia   . Hypertension   . Low back pain   . Lumbar radiculopathy   . Spinal stenosis     Family History  Problem Relation Age of Onset  . Arthritis Mother   . Alcohol abuse Father   . Anxiety disorder Sister   . Anxiety disorder Niece   . Anxiety disorder Granddaughter     Social History   Socioeconomic History  .  Marital status: Widowed    Spouse name: Not on file  . Number of children: Not on file  . Years of education: Not on file  . Highest education level: Not on file  Occupational History  . Not on file  Social Needs  . Financial resource strain: Not on file  . Food insecurity:    Worry: Not on file    Inability: Not on file  . Transportation needs:    Medical: Not on file    Non-medical: Not on file  Tobacco Use  . Smoking status: Never Smoker  . Smokeless tobacco: Never Used  Substance and Sexual Activity  . Alcohol use: Yes    Comment: Reports 1/2 glass of wine periodically  . Drug use: Never  . Sexual activity: Not on file  Lifestyle  . Physical activity:    Days per week: Not on file    Minutes per session: Not on file  . Stress: Not on file  Relationships  . Social connections:    Talks on phone: Not on file    Gets together: Not on file    Attends religious service: Not on file    Active member of club or organization: Not on file    Attends meetings of clubs or organizations: Not on file    Relationship status: Not on file  . Intimate partner violence:    Fear of current or ex partner: Not on file    Emotionally abused: Not on file    Physically abused: Not on file    Forced sexual activity: Not on file  Other Topics Concern  . Not on file  Social History Narrative   ** Merged History Encounter **        Past Medical History, Surgical history, Social history, and Family history were reviewed and updated as appropriate.   Please see review of systems for further details on the patient's review from today.   Objective:   Physical Exam:  There were no vitals taken for this visit.  Physical Exam Neurological:     Mental Status: She is alert and oriented to person, place, and time.     Cranial Nerves: No dysarthria.  Psychiatric:        Attention and Perception: Attention normal.        Speech: Speech normal.        Behavior: Behavior is cooperative.         Thought Content: Thought content normal. Thought content is not paranoid or delusional. Thought content does not include homicidal or suicidal ideation. Thought content does not include homicidal or suicidal plan.        Cognition and Memory: Cognition and memory normal.  Judgment: Judgment normal.     Comments: Patient presents as less anxious and depressed compared to previous exams.  Decreased rumination noted.     Lab Review:     Component Value Date/Time   NA 138 02/23/2018 1047   K 4.8 02/23/2018 1047   CL 101 02/23/2018 1047   CO2 28 02/23/2018 1047   GLUCOSE 94 02/23/2018 1047   BUN 23 02/23/2018 1047   CREATININE 0.93 (H) 02/23/2018 1047   CALCIUM 10.3 02/23/2018 1047   PROT 6.8 10/18/2018 1046   AST 23 10/18/2018 1046   ALT 20 10/18/2018 1046   BILITOT 1.4 (H) 10/18/2018 1046   GFRNONAA 57 (L) 02/23/2018 1047   GFRAA 66 02/23/2018 1047       Component Value Date/Time   WBC 4.6 02/23/2018 1047   RBC 4.84 02/23/2018 1047   HGB 13.3 02/23/2018 1047   HCT 39.9 02/23/2018 1047   PLT 217 02/23/2018 1047   MCV 82.4 02/23/2018 1047   MCH 27.5 02/23/2018 1047   MCHC 33.3 02/23/2018 1047   RDW 13.3 02/23/2018 1047   LYMPHSABS 1,808 02/23/2018 1047   EOSABS 336 02/23/2018 1047   BASOSABS 60 02/23/2018 1047    No results found for: POCLITH, LITHIUM   No results found for: PHENYTOIN, PHENOBARB, VALPROATE, CBMZ   .res Assessment: Plan:   Discussed treatment options for insomnia since patient reports that she has discontinued olanzapine and does not plan on restarting it due to concerns about side effects.  Discussed potential benefits, risks, and side effects of mirtazapine. Will start mirtazapine 15 mg 1/2 to 1 tablet p.o. nightly to improve insomnia, mood, and anxiety. Will continue sertraline 100 mg daily for mood and anxiety since patient is exhibiting some improvement with current dose. Recommend continuing psychotherapy with Rockne Menghini, LCSW. Patient  to follow-up with this provider in 2 months or sooner if clinically indicated. Patient advised to contact office with any questions, adverse effects, or acute worsening in signs and symptoms.  Generalized anxiety disorder - Plan: sertraline (ZOLOFT) 100 MG tablet  Grief reaction - Plan: sertraline (ZOLOFT) 100 MG tablet, mirtazapine (REMERON) 15 MG tablet  Insomnia due to other mental disorder - Plan: mirtazapine (REMERON) 15 MG tablet  Please see After Visit Summary for patient specific instructions.  Future Appointments  Date Time Provider Department Center  01/12/2019  1:00 PM Mathis Fare, LCSW [facility] None  02/23/2019  9:00 AM Baxley, Luanna Cole, MD [facility]  02/27/2019 11:00 AM Margaree Mackintosh, MD [facility]  03/07/2019  9:30 AM Corie Chiquito, PMHNP [facility] None    No orders of the defined types were placed in this encounter.     -------------------------------

## 2019-01-12 ENCOUNTER — Ambulatory Visit (INDEPENDENT_AMBULATORY_CARE_PROVIDER_SITE_OTHER): Payer: Medicare Other | Admitting: Psychiatry

## 2019-01-12 DIAGNOSIS — F411 Generalized anxiety disorder: Secondary | ICD-10-CM | POA: Diagnosis not present

## 2019-01-12 NOTE — Progress Notes (Signed)
[facility] Counselor/Therapist Progress Note  Patient ID: Melissa [name], MRN: [id number],    Date: 01/13/2019  Time Spent: 60 minutes     1:00pm to 2:00pm  Treatment Type: Individual Therapy   Reported Symptoms: grief, anxiety, guilt, depression ("but better"), needing to work more on some forgiveness issues  Mental Status Exam:  Appearance:   casual  Behavior:  Appropriate and Sharing  Motor:  normal  Speech/Language:   Clear and Coherent  Affect:  Tired, some anxiousness  Mood:  anxious and depressed  Thought process:  normal  Thought content:    WNL  Sensory/Perceptual disturbances:    WNL  Orientation:  oriented to person, place, time/date, situation, day of week, month of year and year  Attention:  Good  Concentration:  Fair  Memory:  Patient reports some short-term memory issues  Fund of knowledge:   Good  Insight:    Good  Judgment:   Good  Impulse Control:  Good   Risk Assessment: Danger to Self:  No Self-injurious Behavior: No Danger to Others: No Duty to Warn:no Physical Aggression / Violence:No  Access to Firearms a concern: No Gang Involvement:No    Any new medications or health concerns since last appointment:   Patient reports "none".  Subjective:  Patient in today with symptoms noted above. Discussed her homework from last session and patient did well.  There is some improvement in her grief, guilt, anxiety, and depression.  Still needing to work on those symptoms and the forgiveness issues.  Patient in midst of putting her townhome on the market and will eventually be moving in with her adult daughter's family.  Patient has mixed feelings on this which she talked about today and seemed to appreciate being able to discuss her thoughts and feelings openly without judgement.  Realized there can be some advantages but is also not quite ready to give up her privacy and independence. We looked at some ways she may be able to still have some boundaries,  privacy, and independence even when she moves in with her daughter and family.  Patient is working on packing up some things to get ready to move and that has been emotionally and physically tiring even though she has a lady helping her.  Processed her emotional "tiredenss" and encouraged patient to have some healthy limits each day as she works on the packing and other preparation for moving.  This move will be a big adjustment for her.  Self-care emphasized with patient including physical, social, spiritual, and emotional self-care.  Had 1 really good friend that she can openly speak with as well, and that friend still lives in [place] area [place] patient previously lived.      Talked more today about forgiveness issues, not in the sense that "everything in the situation is ok, but rather, patient is working to forgive and let go so she can eventually move forward in life and not have the weight of a negative experience on her back.  Still seem to have bad memories of events in early mornings when I wake up and also at times when I am alone and not occupied by reading, TV, or other interests such as word-search puzzles; the memories are lessening however.  States that  "I'm the type of person who doesn't let go of things that bother me, but I know I need to do so," and is working on this in therapy.  We reviewed the "letting go" process and how difficult  it can be, and also the result when we don't "let go" of negative things that impact [place].    Additional increased insight into presenting issues and sleep is some better. Feels her medication is helping and she is working on her goals for therapy. Appetite has increased and patient eating better per her report. Denies any SI, and does talk freely in sessions. Discussed some strategies to continue helping her work towards confronting and letting go of negative memories and situations re: things that have happened in the past, in order to move forward.       Interventions: Solution-Oriented/Positive Psychology and Cognitive Behavioral Therapy    Diagnosis:   ICD-10-CM   1. Generalized anxiety disorder F41.1     Plan:  Patient to follow up with CBT strategies discussed in session for homework---will review at session next week.  Is some calmer and she is working hard to maintain even small gains, especially in her (more) positive self-talk and follow through on her goals.  Mathis Fare, LCSW

## 2019-01-26 DIAGNOSIS — Z1239 Encounter for other screening for malignant neoplasm of breast: Secondary | ICD-10-CM | POA: Diagnosis not present

## 2019-01-26 DIAGNOSIS — Z1231 Encounter for screening mammogram for malignant neoplasm of breast: Secondary | ICD-10-CM | POA: Diagnosis not present

## 2019-01-26 LAB — HM MAMMOGRAPHY

## 2019-01-27 ENCOUNTER — Encounter: Payer: Self-pay | Admitting: Internal Medicine

## 2019-02-23 ENCOUNTER — Other Ambulatory Visit: Payer: Medicare Other | Admitting: Internal Medicine

## 2019-02-23 ENCOUNTER — Other Ambulatory Visit: Payer: Self-pay

## 2019-02-23 DIAGNOSIS — E7800 Pure hypercholesterolemia, unspecified: Secondary | ICD-10-CM | POA: Diagnosis not present

## 2019-02-23 DIAGNOSIS — Z0000 Encounter for general adult medical examination without abnormal findings: Secondary | ICD-10-CM | POA: Diagnosis not present

## 2019-02-23 DIAGNOSIS — I10 Essential (primary) hypertension: Secondary | ICD-10-CM | POA: Diagnosis not present

## 2019-02-24 LAB — LIPID PANEL
Cholesterol: 172 mg/dL (ref <200)
HDL: 66 mg/dL (ref >=50)
LDL Cholesterol (Calc): 89 mg/dL (calc)
Non-HDL Cholesterol (Calc): 106 mg/dL (calc) (ref <130)
Total CHOL/HDL Ratio: 2.6 (calc) (ref <5.0)
Triglycerides: 82 mg/dL (ref <150)

## 2019-02-24 LAB — CBC WITH DIFFERENTIAL/PLATELET
Absolute Monocytes: 413 cells/uL (ref 200–950)
Basophils Absolute: 52 cells/uL (ref 0–200)
Basophils Relative: 1.2 %
Eosinophils Absolute: 194 cells/uL (ref 15–500)
Eosinophils Relative: 4.5 %
HCT: 36 % (ref 35.0–45.0)
Hemoglobin: 12 g/dL (ref 11.7–15.5)
Lymphs Abs: 989 cells/uL (ref 850–3900)
MCH: 27.5 pg (ref 27.0–33.0)
MCHC: 33.3 g/dL (ref 32.0–36.0)
MCV: 82.6 fL (ref 80.0–100.0)
MPV: 11.2 fL (ref 7.5–12.5)
Monocytes Relative: 9.6 %
Neutro Abs: 2653 cells/uL (ref 1500–7800)
Neutrophils Relative %: 61.7 %
Platelets: 173 10*3/uL (ref 140–400)
RBC: 4.36 10*6/uL (ref 3.80–5.10)
RDW: 14.3 % (ref 11.0–15.0)
Total Lymphocyte: 23 %
WBC: 4.3 10*3/uL (ref 3.8–10.8)

## 2019-02-24 LAB — COMPLETE METABOLIC PANEL WITH GFR
AG Ratio: 1.7 (calc) (ref 1.0–2.5)
ALT: 18 U/L (ref 6–29)
AST: 19 U/L (ref 10–35)
Albumin: 4.2 g/dL (ref 3.6–5.1)
Alkaline phosphatase (APISO): 61 U/L (ref 37–153)
BUN: 18 mg/dL (ref 7–25)
CO2: 26 mmol/L (ref 20–32)
Calcium: 9.9 mg/dL (ref 8.6–10.4)
Chloride: 105 mmol/L (ref 98–110)
Creat: 0.72 mg/dL (ref 0.60–0.88)
GFR, Est African American: 89 mL/min/{1.73_m2} (ref >=60)
GFR, Est Non African American: 76 mL/min/{1.73_m2} (ref >=60)
Globulin: 2.5 g/dL (calc) (ref 1.9–3.7)
Glucose, Bld: 95 mg/dL (ref 65–99)
Potassium: 4.3 mmol/L (ref 3.5–5.3)
Sodium: 140 mmol/L (ref 135–146)
Total Bilirubin: 0.8 mg/dL (ref 0.2–1.2)
Total Protein: 6.7 g/dL (ref 6.1–8.1)

## 2019-02-24 LAB — TSH: TSH: 1.78 mIU/L (ref 0.40–4.50)

## 2019-02-27 ENCOUNTER — Ambulatory Visit (INDEPENDENT_AMBULATORY_CARE_PROVIDER_SITE_OTHER): Payer: Medicare Other | Admitting: Internal Medicine

## 2019-02-27 ENCOUNTER — Other Ambulatory Visit: Payer: Self-pay

## 2019-02-27 ENCOUNTER — Encounter: Payer: Self-pay | Admitting: Internal Medicine

## 2019-02-27 VITALS — BP 135/82 | HR 78 | Temp 98.4°F | Ht <= 58 in | Wt 134.0 lb

## 2019-02-27 DIAGNOSIS — F411 Generalized anxiety disorder: Secondary | ICD-10-CM | POA: Diagnosis not present

## 2019-02-27 DIAGNOSIS — E782 Mixed hyperlipidemia: Secondary | ICD-10-CM

## 2019-02-27 DIAGNOSIS — I10 Essential (primary) hypertension: Secondary | ICD-10-CM

## 2019-02-27 DIAGNOSIS — Z23 Encounter for immunization: Secondary | ICD-10-CM | POA: Diagnosis not present

## 2019-02-27 DIAGNOSIS — Z0000 Encounter for general adult medical examination without abnormal findings: Secondary | ICD-10-CM

## 2019-02-27 LAB — POCT URINALYSIS DIPSTICK
Appearance: NEGATIVE
Bilirubin, UA: NEGATIVE
Blood, UA: NEGATIVE
Glucose, UA: NEGATIVE
Ketones, UA: NEGATIVE
Leukocytes, UA: NEGATIVE
Nitrite, UA: NEGATIVE
Odor: NEGATIVE
Protein, UA: NEGATIVE
Spec Grav, UA: 1.015 (ref 1.010–1.025)
Urobilinogen, UA: 0.2 E.U./dL
pH, UA: 6.5 (ref 5.0–8.0)

## 2019-02-27 NOTE — Patient Instructions (Addendum)
Pneumococcal 23 vaccine given.  Lab work is entirely normal.  Continue same medications and follow-up in 6 months.  It is a pleasure to see you today.

## 2019-02-27 NOTE — Progress Notes (Signed)
Subjective:    Patient ID: Melissa SimpsonVermell [name], female    DOB: 1931/08/04, 85 y.o.   MRN: [id number]  HPI 85 year old Female in today for health maintenance exam, Medicare wellness and evaluation of medical issues.  Of the past year she has grieve the death of her husband.  She is now going to a counselor and seems much less anxious.  Still is uncomfortable driving in certain parts of [place] due to traffic but she is able to maneuver around less busier roads.  History of lumbar radiculopathy and spinal stenosis.  Says she has had an ablation procedure for been in [place] a number of years ago but we do not have those records.  Says she has a history of bladder dysfunction with urinary frequency and had a sacral nerve stimulator placed in [place] a number of years ago as well.  She had the device turned off because it was causing some odd vibrations in her vagina and did not really help the bladder problem but he still implanted.  History of benign right breast biopsy in the remote past.  Current issues are hypertension and hyperlipidemia.  Intolerant of aspirin-causes dizziness and nausea.  Codeine causes dizziness and nausea.  Tramadol causes dizziness and weakness.  Has Xanax for insomnia and anxiety.  Social history: [facility] with [facility].  They were married about 60 years.  He was the [employer] president [employer].  They formally lived in [place] and moved to [place] not too long before he passed away. One daughter and 1 son.  She plans to moving in with her daughter and her family soon.  They have found a new home that is large enough to include the patient.   Review of Systems no new complaints and she seems much less anxious     Objective:   Physical Exam Pulse 78 regular temperature 98.4 degrees pulse oximetry 98% BMI 28.01 weight 134 pounds.  Blood pressure 130/80  Skin warm and dry.  Nodes none.  TMs are  clear.  Neck is supple without JVD thyromegaly or carotid bruits.  Chest clear.  Breasts normal female without masses.  Cardiac exam regular rate and rhythm without murmurs or gallops.  Abdomen no hepatosplenomegaly masses or tenderness.  Bimanual exam is normal.  Pap deferred due to age.  Neuro no gross focal deficits on brief neurological exam.  No lower extremity edema.       Assessment & Plan:  History of grief reaction and anxiety-she seems much calmer than she did last year at this time.  I think counseling has helped her a great deal.  Still has some anxiety with driving.  Takes Xanax and does not seem to have any side effects from it.  Essential hypertension-stable on current regimen  History of lumbar radiculopathy and spinal stenosis  History of benign right breast biopsy  History of bladder dysfunction  History of hyperlipidemia  Plan: Needed in the past that she take Xanax at least twice daily and certainly at night for sleep.  Follow-up in 6 months.  Since we cannot find documentation of her pneumococcal 23 vaccine in [place] we are going to give her pneumococcal 23 vaccine today.  Subjective:   Patient presents for Medicare Annual/Subsequent preventive examination.  Review Past Medical/Family/Social: See above   Risk Factors  Current exercise habits: Sedentary Dietary issues discussed: Low-fat low carbohydrate  Cardiac risk factors: Hyperlipidemia and hypertension  Depression Screen  (Note: if answer to  either of the following is "Yes", a more complete depression screening is indicated)   Over the past two weeks, have you felt down, depressed or hopeless?  Yes but getting better Over the past two weeks, have you felt little interest or pleasure in doing things?  Yes Have you lost interest or pleasure in daily life?  Yes Do you often feel hopeless?  Yes Do you cry easily over simple problems?  Yes  Activities of Daily Living  In your present state of  health, do you have any difficulty performing the following activities?:   Driving? No  Managing money? No  Feeding yourself? No  Getting from bed to chair? No  Climbing a flight of stairs? No  Preparing food and eating?: No  Bathing or showering? No  Getting dressed: No  Getting to the toilet? No  Using the toilet:No  Moving around from place to place: No  In the past year have you fallen or had a near fall?:No  Are you sexually active? No  Do you have more than one partner? No   Hearing Difficulties: No  Do you often ask people to speak up or repeat themselves? No  Do you experience ringing or noises in your ears? No  Do you have difficulty understanding soft or whispered voices? No  Do you feel that you have a problem with memory?  Yes some Do you often misplace items?  Yes   Home Safety:  Do you have a smoke alarm at your residence? Yes Do you have grab bars in the bathroom?  No Do you have throw rugs in your house?  Yes   Cognitive Testing  Alert? Yes Normal Appearance?Yes  Oriented to person? Yes Place? Yes  Time? Yes  Recall of three objects?  Not tested Can perform simple calculations? Yes  Displays appropriate judgment?Yes  Can read the correct time from a watch face?Yes   List the Names of Other Physician/Practitioners you currently use:  See referral list for the physicians patient is currently seeing.     Review of Systems: See above   Objective:     General appearance: Appears younger than stated age  Head: Normocephalic, without obvious abnormality, atraumatic  Eyes: conj clear, EOMi PEERLA  Ears: normal TM's and external ear canals both ears  Nose: Nares normal. Septum midline. Mucosa normal. No drainage or sinus tenderness.  Throat: lips, mucosa, and tongue normal; teeth and gums normal  Neck: no adenopathy, no carotid bruit, no JVD, supple, symmetrical, trachea midline and thyroid not enlarged, symmetric, no tenderness/mass/nodules  No CVA  tenderness.  Lungs: clear to auscultation bilaterally  Breasts: normal appearance, no masses or tenderness Heart: regular rate and rhythm, S1, S2 normal, no murmur, click, rub or gallop  Abdomen: soft, non-tender; bowel sounds normal; no masses, no organomegaly  Musculoskeletal: ROM normal in all joints, no crepitus, no deformity, Normal muscle strengthen. Back  is symmetric, no curvature. Skin: Skin color, texture, turgor normal. No rashes or lesions  Lymph nodes: Cervical, supraclavicular, and axillary nodes normal.  Neurologic: CN 2 -12 Normal, Normal symmetric reflexes. Normal coordination and gait  Psych: Alert & Oriented x 3, Mood appear stable.    Assessment:    Annual wellness medicare exam   Plan:    During the course of the visit the patient was educated and counseled about appropriate screening and preventive services including:   Annual mammogram  Annual flu vaccine     Patient Instructions (the written plan) was given  to the patient.  Medicare Attestation  I have personally reviewed:  The patient's medical and social history  Their use of alcohol, tobacco or illicit drugs  Their current medications and supplements  The patient's functional ability including ADLs,fall risks, home safety risks, cognitive, and hearing and visual impairment  Diet and physical activities  Evidence for depression or mood disorders  The patient's weight, height, BMI, and visual acuity have been recorded in the chart. I have made referrals, counseling, and provided education to the patient based on review of the above and I have provided the patient with a written personalized care plan for preventive services.

## 2019-03-07 ENCOUNTER — Encounter: Payer: Self-pay | Admitting: Psychiatry

## 2019-03-07 ENCOUNTER — Ambulatory Visit (INDEPENDENT_AMBULATORY_CARE_PROVIDER_SITE_OTHER): Payer: Medicare Other | Admitting: Psychiatry

## 2019-03-07 ENCOUNTER — Other Ambulatory Visit: Payer: Self-pay

## 2019-03-07 DIAGNOSIS — F411 Generalized anxiety disorder: Secondary | ICD-10-CM

## 2019-03-07 DIAGNOSIS — F4321 Adjustment disorder with depressed mood: Secondary | ICD-10-CM | POA: Diagnosis not present

## 2019-03-07 MED ORDER — SERTRALINE HCL 100 MG PO TABS: 150.0000 mg | ORAL_TABLET | Freq: Every day | ORAL | 1 refills | Status: DC

## 2019-03-07 NOTE — Patient Instructions (Signed)
Increase Sertraline (Zoloft) to 150 mg daily. Take 1.5 tablets of the 100 mg tab to equal 150 mg.  This should help decrease anxiety and worry. This should also help with mood.

## 2019-03-07 NOTE — Progress Notes (Signed)
Melissa [name] [id number] Jul 13, 1931 85 y.o.  Subjective:   Patient ID:  Melissa [name] is a 85 y.o. (DOB Jul 13, 1931) female.  Chief Complaint:  Chief Complaint  Patient presents with  . Anxiety    HPI Melissa [name] presents to the office today for follow-up of anxiety and depression. She reports that she and her daughter's family have bought a house that has separate living quarters for her. She reports stress with upcoming move and try to sell her home. Daughter is working from home. She reports that she has severe anxiety with driving and will take Xanax 0.5 mg 1/2-1 tab po qd prn while driving. She reports that in the past Xanax 0.5 mg 1/2 tab was more effective and that she now has to take one 0.5 mg tab to be effective. She reports that she  Avoids highways, sharp curves, and hills. She reports anxiety with driving is worse compared to the past. She reports, "I worry a lot." She reports that she continues to experience rumination at times and that overall it is less compared to before starting Sertraline.   She reports that she cried throughout the day yesterday with watching a congressman's funeral- "I was re-living my husband's" funeral. She reports continuing to grieve losses. Reports feeling at times that she is a burden to her daughter. Pt reports excessive feelings of guilt. She reports that she awakens many mornings at 4 am and sometimes is unable to return to sleep. Reports that she is sometimes is not going to bed until 11 pm- 12 am. Denies difficulty falling asleep. She reports that her appetite "comes and goes." She reports that energy and motivation is ok- "I have to push myself." Denies SI.   She reports that she continues to have difficulty with concentration and some difficulty with memory.   Past Psychiatric Medication Trials: Olanzapine Alprazolam- uses as needed for anxiety Sertraline Remeron- partially effective  Review of Systems:  Review of Systems   HENT: Positive for postnasal drip.   Musculoskeletal: Negative for gait problem.  Neurological: Negative for tremors.  Psychiatric/Behavioral:       Please refer to HPI    Medications: I have reviewed the patient's current medications.  Current Outpatient Medications  Medication Sig Dispense Refill  . acetaminophen (TYLENOL) 325 MG tablet Take 250 mg by mouth every 6 (six) hours as needed.    . ALPRAZolam (XANAX) 0.5 MG tablet TAKE 1/2 TABLET to One tablet BY MOUTH EVERY MORNING AND  One half to one table  BY MOUTH QHS 60 tablet 2  . atorvastatin (LIPITOR) 40 MG tablet TAKE 1 TABLET BY MOUTH DAILY 90 tablet 3  . cholecalciferol (VITAMIN D) 1000 units tablet Take 1,000 Units by mouth daily.    . magnesium 30 MG tablet Take 250 mg by mouth daily.    Marland Kitchen. OLANZapine (ZYPREXA) 5 MG tablet Take 1 tablet (5 mg total) by mouth at bedtime. 30 tablet 5  . Omega-3 Fatty Acids (FISH OIL) 1200 MG CAPS Take 1,200 mg by mouth daily.    . TURMERIC PO Take by mouth.    . valsartan-hydrochlorothiazide (DIOVAN-HCT) 80-12.5 MG tablet Take 1 tablet by mouth daily. 90 tablet 3  . vitamin B-12 (CYANOCOBALAMIN) 250 MCG tablet Take 250 mcg by mouth daily.    . vitamin E 1000 UNIT capsule Take 1,000 Units by mouth daily.    . mirtazapine (REMERON) 15 MG tablet Take 1/2-1 tab po QHS (Patient not taking: Reported on 03/07/2019) 30 tablet 1  .  sertraline (ZOLOFT) 100 MG tablet Take 1.5 tablets (150 mg total) by mouth daily. 45 tablet 1   No current facility-administered medications for this visit.     Medication Side Effects: None  Allergies:  Allergies  Allergen Reactions  . Aspirin Other (See Comments)    Nausea, dizziness, malaise.  . Aspirin Nausea Only  . Codeine   . Tramadol     Past Medical History:  Diagnosis Date  . Allergy   . Anxiety   . Bladder dysfunction   . Bulging lumbar disc   . Depression   . Hyperlipidemia   . Hypertension   . Low back pain   . Lumbar radiculopathy   . Spinal  stenosis     Family History  Problem Relation Age of Onset  . Arthritis Mother   . Alcohol abuse Father   . Anxiety disorder Sister   . Anxiety disorder Niece   . Anxiety disorder Granddaughter     Social History   Socioeconomic History  . Marital status: Widowed    Spouse name: Not on file  . Number of children: Not on file  . Years of education: Not on file  . Highest education level: Not on file  Occupational History  . Not on file  Social Needs  . Financial resource strain: Not on file  . Food insecurity    Worry: Not on file    Inability: Not on file  . Transportation needs    Medical: Not on file    Non-medical: Not on file  Tobacco Use  . Smoking status: Never Smoker  . Smokeless tobacco: Never Used  Substance and Sexual Activity  . Alcohol use: Yes    Comment: Reports 1/2 glass of wine periodically  . Drug use: Never  . Sexual activity: Not on file  Lifestyle  . Physical activity    Days per week: Not on file    Minutes per session: Not on file  . Stress: Not on file  Relationships  . Social Musicianconnections    Talks on phone: Not on file    Gets together: Not on file    Attends religious service: Not on file    Active member of club or organization: Not on file    Attends meetings of clubs or organizations: Not on file    Relationship status: Not on file  . Intimate partner violence    Fear of current or ex partner: Not on file    Emotionally abused: Not on file    Physically abused: Not on file    Forced sexual activity: Not on file  Other Topics Concern  . Not on file  Social History Narrative   ** Merged History Encounter **        Past Medical History, Surgical history, Social history, and Family history were reviewed and updated as appropriate.   Please see review of systems for further details on the patient's review from today.   Objective:   Physical Exam:  There were no vitals taken for this visit.  Physical Exam Constitutional:       General: She is not in acute distress.    Appearance: She is well-developed.  Musculoskeletal:        General: No deformity.  Neurological:     Mental Status: She is alert and oriented to person, place, and time.     Coordination: Coordination normal.  Psychiatric:        Attention and Perception: Attention and perception normal. She  does not perceive auditory or visual hallucinations.        Mood and Affect: Mood is anxious. Mood is not depressed. Affect is not labile, blunt, angry or inappropriate.        Speech: Speech normal.        Behavior: Behavior normal.        Thought Content: Thought content normal. Thought content is not paranoid or delusional. Thought content does not include homicidal or suicidal ideation. Thought content does not include homicidal or suicidal plan.        Cognition and Memory: Cognition normal. She exhibits impaired recent memory.        Judgment: Judgment normal.     Comments: Insight intact.      Lab Review:     Component Value Date/Time   NA 140 02/23/2019 0920   K 4.3 02/23/2019 0920   CL 105 02/23/2019 0920   CO2 26 02/23/2019 0920   GLUCOSE 95 02/23/2019 0920   BUN 18 02/23/2019 0920   CREATININE 0.72 02/23/2019 0920   CALCIUM 9.9 02/23/2019 0920   PROT 6.7 02/23/2019 0920   AST 19 02/23/2019 0920   ALT 18 02/23/2019 0920   BILITOT 0.8 02/23/2019 0920   GFRNONAA 76 02/23/2019 0920   GFRAA 89 02/23/2019 0920       Component Value Date/Time   WBC 4.3 02/23/2019 0920   RBC 4.36 02/23/2019 0920   HGB 12.0 02/23/2019 0920   HCT 36.0 02/23/2019 0920   PLT 173 02/23/2019 0920   MCV 82.6 02/23/2019 0920   MCH 27.5 02/23/2019 0920   MCHC 33.3 02/23/2019 0920   RDW 14.3 02/23/2019 0920   LYMPHSABS 989 02/23/2019 0920   EOSABS 194 02/23/2019 0920   BASOSABS 52 02/23/2019 0920    No results found for: POCLITH, LITHIUM   No results found for: PHENYTOIN, PHENOBARB, VALPROATE, CBMZ   .res Assessment: Plan:   Discussed potential  benefits, risks, and side effects of increasing sertraline to 150 mg daily to improve anxiety since patient has had a partial response with lower doses of sertraline but continues to have rumination and frequent worry and catastrophic thinking. Patient prescribed Xanax and olanzapine by PCP. Recommend continuing psychotherapy with Melissa Cloud, LCSW. Patient to follow-up with this provider in 4 weeks or sooner if clinically indicated. Patient advised to contact office with any questions, adverse effects, or acute worsening in signs and symptoms.  Melissa [name] was seen today for anxiety.  Diagnoses and all orders for this visit:  Generalized anxiety disorder -     sertraline (ZOLOFT) 100 MG tablet; Take 1.5 tablets (150 mg total) by mouth daily.  Grief reaction -     sertraline (ZOLOFT) 100 MG tablet; Take 1.5 tablets (150 mg total) by mouth daily.     Please see After Visit Summary for patient specific instructions.  Future Appointments  Date Time Provider [place]  04/05/2019  9:30 AM Thayer Headings, PMHNP CP-CP None  08/24/2019  9:15 AM Baxley, Cresenciano Lick, MD [facility]  08/31/2019 10:45 AM Baxley, Cresenciano Lick, MD [facility]    No orders of the defined types were placed in this encounter.   -------------------------------

## 2019-03-09 ENCOUNTER — Encounter: Payer: Self-pay | Admitting: Internal Medicine

## 2019-03-16 ENCOUNTER — Other Ambulatory Visit: Payer: Self-pay | Admitting: Internal Medicine

## 2019-03-16 NOTE — Telephone Encounter (Signed)
Received Fax RX request from  Pharmacy - [facility]  Medication - ALPRAZolam (XANAX) 0.5 MG tablet    Last Refill - 11/21/18  Last OV - 02/27/19  Last CPE - 02/27/19

## 2019-03-17 MED ORDER — ALPRAZOLAM 0.5 MG PO TABS: ORAL_TABLET | ORAL | 2 refills | Status: DC

## 2019-04-05 ENCOUNTER — Ambulatory Visit (INDEPENDENT_AMBULATORY_CARE_PROVIDER_SITE_OTHER): Payer: Medicare Other | Admitting: Psychiatry

## 2019-04-05 ENCOUNTER — Other Ambulatory Visit: Payer: Self-pay

## 2019-04-05 ENCOUNTER — Encounter: Payer: Self-pay | Admitting: Psychiatry

## 2019-04-05 DIAGNOSIS — F411 Generalized anxiety disorder: Secondary | ICD-10-CM | POA: Diagnosis not present

## 2019-04-05 DIAGNOSIS — F4321 Adjustment disorder with depressed mood: Secondary | ICD-10-CM

## 2019-04-05 MED ORDER — SERTRALINE HCL 100 MG PO TABS: 100.0000 mg | ORAL_TABLET | Freq: Every day | ORAL | 0 refills | Status: DC

## 2019-04-05 NOTE — Progress Notes (Signed)
[name] [id number] 10-19-1930 85 y.o.  Virtual Visit via Telephone Note  I connected with pt on 04/05/19 at  9:30 AM EDT by telephone and verified that I am speaking with the correct person using two identifiers.   I discussed the limitations, risks, security and privacy concerns of performing an evaluation and management service by telephone and the availability of in person appointments. I also discussed with the patient that there may be a patient responsible charge related to this service. The patient expressed understanding and agreed to proceed.   I discussed the assessment and treatment plan with the patient. The patient was provided an opportunity to ask questions and all were answered. The patient agreed with the plan and demonstrated an understanding of the instructions.   The patient was advised to call back or seek an in-person evaluation if the symptoms worsen or if the condition fails to improve as anticipated.  I provided 30 minutes of non-face-to-face time during this encounter.  The patient was located at home.  [facility]   Corie ChiquitoJessica Joyanne Eddinger, PMHNP   Subjective:   Patient ID:  Melissa [name] is a 85 y.o. (DOB 10-19-1930) female.  Chief Complaint:  Chief Complaint  Patient presents with  . Anxiety    HPI Melissa [name] presents for follow-up of anxiety and depression. She reports that she fell a couple of weeks ago and has been having difficulty walking. She reports that her ankles are swollen and she injured her leg. She denies feeling dizzy at time of fall. Reports that she fell wearing clogs going up stairs.   "I think I am trying to do too much. Sort of stressed out."  She has been showing her house in order to try to sell it and trying to keep it clean in case it needs to be shown at the last minute. She reports that she "had a funny feeling, my chest didn't feel right" when she increased Sertraline to 1.5 tabs  po qd and reports that she then resumed taking only 1 tab and this resolved. She reports that her anxiety has been manageable. She reports using Xanax prn when acute anxiety occurs and prior to driving. She reports that she continues to have anxious thoughts and rumination. She reports that sleep is not disturbed due to anxious thoughts. Reports occasional sadness in response to past events. She reports that her appetite was "ok for awhile" and yesterday did not have an appetite and reports that rumination was worse yesterday. She reports that her energy is "ok, I have to force myself to do things because there are a lot of things that have to be done" in regards to packing and moving. She reports concentration is ok "sometimes." Denies SI.   She has not yet moved. She reports that her son came into town last weekend and they and other family members cleared out a storage unit.  Pt reports that she takes Olanzapine and Remeron prn occasionally.   Past Psychiatric Medication Trials: Olanzapine Alprazolam- uses as needed for anxiety Sertraline- side effects with doses above 100 mg po qd Remeron- partially effective  Review of Systems:  Review of Systems  Medications: I have reviewed the patient's current medications.  Current Outpatient Medications  Medication Sig Dispense Refill  . acetaminophen (TYLENOL) 325 MG tablet Take 250 mg by mouth every 6 (six) hours as needed.    . ALPRAZolam (XANAX) 0.5 MG tablet TAKE 1/2 TABLET to One tablet BY MOUTH EVERY  MORNING AND  One half to one table  BY MOUTH QHS 60 tablet 2  . atorvastatin (LIPITOR) 40 MG tablet TAKE 1 TABLET BY MOUTH DAILY 90 tablet 3  . cholecalciferol (VITAMIN D) 1000 units tablet Take 1,000 Units by mouth daily.    . magnesium 30 MG tablet Take 250 mg by mouth daily.    . Omega-3 Fatty Acids (FISH OIL) 1200 MG CAPS Take 1,200 mg by mouth daily.    . sertraline (ZOLOFT) 100 MG tablet Take 1 tablet (100 mg total) by mouth daily. 90  tablet 0  . TURMERIC PO Take by mouth.    . valsartan-hydrochlorothiazide (DIOVAN-HCT) 80-12.5 MG tablet Take 1 tablet by mouth daily. 90 tablet 3  . vitamin B-12 (CYANOCOBALAMIN) 250 MCG tablet Take 250 mcg by mouth daily.    . vitamin E 1000 UNIT capsule Take 1,000 Units by mouth daily.    . mirtazapine (REMERON) 15 MG tablet Take 1/2-1 tab po QHS (Patient not taking: Reported on 03/07/2019) 30 tablet 1  . OLANZapine (ZYPREXA) 5 MG tablet Take 1 tablet (5 mg total) by mouth at bedtime. (Patient taking differently: Take 5 mg by mouth at bedtime. Reports taking prn) 30 tablet 5   No current facility-administered medications for this visit.     Medication Side Effects: None  Allergies:  Allergies  Allergen Reactions  . Aspirin Other (See Comments)    Nausea, dizziness, malaise.  . Aspirin Nausea Only  . Codeine   . Tramadol     Past Medical History:  Diagnosis Date  . Allergy   . Anxiety   . Bladder dysfunction   . Bulging lumbar disc   . Depression   . Hyperlipidemia   . Hypertension   . Low back pain   . Lumbar radiculopathy   . Spinal stenosis     Family History  Problem Relation Age of Onset  . Arthritis Mother   . Alcohol abuse Father   . Anxiety disorder Sister   . Anxiety disorder Niece   . Anxiety disorder Granddaughter     Social History   Socioeconomic History  . Marital status: Widowed    Spouse name: Not on file  . Number of children: Not on file  . Years of education: Not on file  . Highest education level: Not on file  Occupational History  . Not on file  Social Needs  . Financial resource strain: Not on file  . Food insecurity    Worry: Not on file    Inability: Not on file  . Transportation needs    Medical: Not on file    Non-medical: Not on file  Tobacco Use  . Smoking status: Never Smoker  . Smokeless tobacco: Never Used  Substance and Sexual Activity  . Alcohol use: Yes    Comment: Reports 1/2 glass of wine periodically  . Drug  use: Never  . Sexual activity: Not on file  Lifestyle  . Physical activity    Days per week: Not on file    Minutes per session: Not on file  . Stress: Not on file  Relationships  . Social Musician on phone: Not on file    Gets together: Not on file    Attends religious service: Not on file    Active member of club or organization: Not on file    Attends meetings of clubs or organizations: Not on file    Relationship status: Not on file  . Intimate  partner violence    Fear of current or ex partner: Not on file    Emotionally abused: Not on file    Physically abused: Not on file    Forced sexual activity: Not on file  Other Topics Concern  . Not on file  Social History Narrative   ** Merged History Encounter **        Past Medical History, Surgical history, Social history, and Family history were reviewed and updated as appropriate.   Please see review of systems for further details on the patient's review from today.   Objective:   Physical Exam:  Wt 136 lb (61.7 kg)   BMI 28.42 kg/m   Physical Exam Neurological:     Mental Status: She is alert and oriented to person, place, and time.     Cranial Nerves: No dysarthria.  Psychiatric:        Attention and Perception: Attention normal.        Mood and Affect: Mood is anxious.        Speech: Speech normal.        Behavior: Behavior is cooperative.        Thought Content: Thought content normal. Thought content is not paranoid or delusional. Thought content does not include homicidal or suicidal ideation. Thought content does not include homicidal or suicidal plan.        Cognition and Memory: Cognition and memory normal.        Judgment: Judgment normal.     Comments: Rumination     Lab Review:     Component Value Date/Time   NA 140 02/23/2019 0920   K 4.3 02/23/2019 0920   CL 105 02/23/2019 0920   CO2 26 02/23/2019 0920   GLUCOSE 95 02/23/2019 0920   BUN 18 02/23/2019 0920   CREATININE 0.72  02/23/2019 0920   CALCIUM 9.9 02/23/2019 0920   PROT 6.7 02/23/2019 0920   AST 19 02/23/2019 0920   ALT 18 02/23/2019 0920   BILITOT 0.8 02/23/2019 0920   GFRNONAA 76 02/23/2019 0920   GFRAA 89 02/23/2019 0920       Component Value Date/Time   WBC 4.3 02/23/2019 0920   RBC 4.36 02/23/2019 0920   HGB 12.0 02/23/2019 0920   HCT 36.0 02/23/2019 0920   PLT 173 02/23/2019 0920   MCV 82.6 02/23/2019 0920   MCH 27.5 02/23/2019 0920   MCHC 33.3 02/23/2019 0920   RDW 14.3 02/23/2019 0920   LYMPHSABS 989 02/23/2019 0920   EOSABS 194 02/23/2019 0920   BASOSABS 52 02/23/2019 0920    No results found for: POCLITH, LITHIUM   No results found for: PHENYTOIN, PHENOBARB, VALPROATE, CBMZ   .res Assessment: Plan:   Patient seen virtually for 30 minutes and greater than 50% of visit spent counseling patient and coordination of care to include patient reporting recent lapse in therapy due to not having a scheduled appointment.  Agree that seeing therapist would likely be beneficial at this time, particularly with increased psychosocial stressors.  Assisted with patient getting scheduled to see therapist again. Discussed continuing sertraline 100 mg daily since this has been somewhat effective for patient's mood and anxiety.  Agreed with patient's decision to resume 100 mg daily after experiencing side effects at 150 mg daily. Provided counseling regarding patient's concerns that taking either olanzapine or mirtazapine nightly could potentially be habit-forming and discussed that these medications are not controlled substances and are safe to take on a nightly basis.  Patient reports that mirtazapine  seems to be more effective for her insomnia compared to olanzapine.  Encouraged patient to consider taking mirtazapine nightly to improve sleep quality and quantity and that mirtazapine may also with mood and anxiety signs and symptoms, particularly at this time since patient reports some increase in anxiety  in response to situational stress with moving. Patient to follow-up in 6 weeks or sooner if clinically indicated. Patient advised to contact office with any questions, adverse effects, or acute worsening in signs and symptoms.  Aislynn was seen today for anxiety.  Diagnoses and all orders for this visit:  Generalized anxiety disorder -     sertraline (ZOLOFT) 100 MG tablet; Take 1 tablet (100 mg total) by mouth daily.  Grief reaction -     sertraline (ZOLOFT) 100 MG tablet; Take 1 tablet (100 mg total) by mouth daily.    Please see After Visit Summary for patient specific instructions.  Future Appointments  Date Time Provider [place]  05/17/2019  9:30 AM Thayer Headings, PMHNP CP-CP None  05/22/2019 10:00 AM Shanon Ace, LCSW CP-CP None  08/24/2019  9:15 AM Baxley, Cresenciano Lick, MD MJB-MJB MJB  08/31/2019 10:45 AM Baxley, Cresenciano Lick, MD MJB-MJB MJB    No orders of the defined types were placed in this encounter.     -------------------------------

## 2019-05-03 ENCOUNTER — Other Ambulatory Visit: Payer: Self-pay | Admitting: Psychiatry

## 2019-05-03 DIAGNOSIS — F411 Generalized anxiety disorder: Secondary | ICD-10-CM

## 2019-05-03 DIAGNOSIS — F4321 Adjustment disorder with depressed mood: Secondary | ICD-10-CM

## 2019-05-17 ENCOUNTER — Ambulatory Visit (INDEPENDENT_AMBULATORY_CARE_PROVIDER_SITE_OTHER): Payer: Medicare Other | Admitting: Psychiatry

## 2019-05-17 ENCOUNTER — Encounter: Payer: Self-pay | Admitting: Psychiatry

## 2019-05-17 ENCOUNTER — Other Ambulatory Visit: Payer: Self-pay

## 2019-05-17 DIAGNOSIS — F411 Generalized anxiety disorder: Secondary | ICD-10-CM

## 2019-05-17 DIAGNOSIS — F99 Mental disorder, not otherwise specified: Secondary | ICD-10-CM

## 2019-05-17 DIAGNOSIS — F4321 Adjustment disorder with depressed mood: Secondary | ICD-10-CM

## 2019-05-17 DIAGNOSIS — F5105 Insomnia due to other mental disorder: Secondary | ICD-10-CM | POA: Diagnosis not present

## 2019-05-17 MED ORDER — MIRTAZAPINE 15 MG PO TABS: ORAL_TABLET | ORAL | 1 refills | Status: DC

## 2019-05-17 MED ORDER — SERTRALINE HCL 100 MG PO TABS: 100.0000 mg | ORAL_TABLET | Freq: Every day | ORAL | 0 refills | Status: DC

## 2019-05-17 NOTE — Progress Notes (Signed)
Melissa [name] [id number] [birth year] 85 y.o.  Subjective:   Patient ID:  Melissa [name] is a 85 y.o. (DOB [birth year]) female.  Chief Complaint:  Chief Complaint  Patient presents with  . Anxiety  . Depression    HPI Melissa [name] presents to the office today for follow-up of anxiety and depression. She has moved to a home with her daughter and son-in-law. She reports having 2 falls about a month ago and reports that she was hurrying to do things. She reports that daughter is now working remotely from home to monitor pt's safety. Pt reports that family has also been driving her places since they prefer that she not drive. She reports that Alprazolam is helpful for her anxiety. She reports that she takes Xanax 0.5 mg po QHS and will take one tab qd prn before going somewhere. She reports sleeping 6-7 hours a night. She reports that her anxiety has been increased with moving. She continues to have frequent rumination about certain events to include her husband's death. Reports that she is replaying certain conversations in her mind. She reports that her mood has been more depressed in the last couple of weeks after someone made a comment that she continues to analyze. She reports occ crying episodes. Lower energy and motivation. She reports that she is losing weight again and appetite has been decreased. Reports passive death wishes. Denies SI.   Reports that she has not taken Olanzapine for several months. Reports that she has not taken Mirtazapine for one month. Pt reports that she has RF remaining on Remeron and script was sent 01/05/19 with one RF.  Past Psychiatric Medication Trials: Olanzapine Alprazolam- uses as needed for anxiety Sertraline- side effects with doses above 100 mg po qd Remeron- partially effective   Review of Systems:  Review of Systems  Musculoskeletal: Positive for back pain. Negative for gait problem.  Neurological: Negative for tremors.   Psychiatric/Behavioral:       Please refer to HPI    Medications: I have reviewed the patient's current medications.  Current Outpatient Medications  Medication Sig Dispense Refill  . ALPRAZolam (XANAX) 0.5 MG tablet TAKE 1/2 TABLET to One tablet BY MOUTH EVERY MORNING AND  One half to one table  BY MOUTH QHS 60 tablet 2  . atorvastatin (LIPITOR) 40 MG tablet TAKE 1 TABLET BY MOUTH DAILY 90 tablet 3  . cholecalciferol (VITAMIN D) 1000 units tablet Take 1,000 Units by mouth daily.    . magnesium 30 MG tablet Take 250 mg by mouth daily.    . Omega-3 Fatty Acids (FISH OIL) 1200 MG CAPS Take 1,200 mg by mouth daily.    . sertraline (ZOLOFT) 100 MG tablet Take 1 tablet (100 mg total) by mouth daily. 90 tablet 0  . TURMERIC PO Take by mouth.    . valsartan-hydrochlorothiazide (DIOVAN-HCT) 80-12.5 MG tablet Take 1 tablet by mouth daily. 90 tablet 3  . vitamin B-12 (CYANOCOBALAMIN) 250 MCG tablet Take 250 mcg by mouth daily.    . vitamin E 1000 UNIT capsule Take 1,000 Units by mouth daily.    Marland Kitchen. acetaminophen (TYLENOL) 325 MG tablet Take 250 mg by mouth every 6 (six) hours as needed.    . mirtazapine (REMERON) 15 MG tablet Take 1/2-1 tab po QHS 30 tablet 1  . OLANZapine (ZYPREXA) 5 MG tablet Take 1 tablet (5 mg total) by mouth at bedtime. (Patient not taking: Reported on 05/17/2019) 30 tablet 5   No current facility-administered medications for this visit.  Medication Side Effects: None  Allergies:  Allergies  Allergen Reactions  . Aspirin Other (See Comments)    Nausea, dizziness, malaise.  . Aspirin Nausea Only  . Codeine   . Tramadol     Past Medical History:  Diagnosis Date  . Allergy   . Anxiety   . Bladder dysfunction   . Bulging lumbar disc   . Depression   . Hyperlipidemia   . Hypertension   . Low back pain   . Lumbar radiculopathy   . Spinal stenosis     Family History  Problem Relation Age of Onset  . Arthritis Mother   . Alcohol abuse Father   . Anxiety  disorder Sister   . Anxiety disorder Niece   . Anxiety disorder Granddaughter     Social History   Socioeconomic History  . Marital status: Widowed    Spouse name: Not on file  . Number of children: Not on file  . Years of education: Not on file  . Highest education level: Not on file  Occupational History  . Not on file  Social Needs  . Financial resource strain: Not on file  . Food insecurity    Worry: Not on file    Inability: Not on file  . Transportation needs    Medical: Not on file    Non-medical: Not on file  Tobacco Use  . Smoking status: Never Smoker  . Smokeless tobacco: Never Used  Substance and Sexual Activity  . Alcohol use: Yes    Comment: Reports 1/2 glass of wine periodically  . Drug use: Never  . Sexual activity: Not on file  Lifestyle  . Physical activity    Days per week: Not on file    Minutes per session: Not on file  . Stress: Not on file  Relationships  . Social Herbalist on phone: Not on file    Gets together: Not on file    Attends religious service: Not on file    Active member of club or organization: Not on file    Attends meetings of clubs or organizations: Not on file    Relationship status: Not on file  . Intimate partner violence    Fear of current or ex partner: Not on file    Emotionally abused: Not on file    Physically abused: Not on file    Forced sexual activity: Not on file  Other Topics Concern  . Not on file  Social History Narrative   ** Merged History Encounter **        Past Medical History, Surgical history, Social history, and Family history were reviewed and updated as appropriate.   Please see review of systems for further details on the patient's review from today.   Objective:   Physical Exam:  Wt 126 lb (57.2 kg)   BMI 26.33 kg/m   Physical Exam Constitutional:      General: She is not in acute distress.    Appearance: She is well-developed.  Musculoskeletal:        General: No  deformity.  Neurological:     Mental Status: She is alert and oriented to person, place, and time.     Coordination: Coordination normal.  Psychiatric:        Attention and Perception: Attention and perception normal. She does not perceive auditory or visual hallucinations.        Mood and Affect: Mood is anxious and depressed. Affect is not labile, blunt,  angry or inappropriate.        Speech: Speech normal.        Behavior: Behavior is cooperative.        Thought Content: Thought content normal. Thought content is not paranoid or delusional. Thought content does not include homicidal or suicidal ideation. Thought content does not include homicidal or suicidal plan.        Cognition and Memory: Cognition normal. She exhibits impaired recent memory.        Judgment: Judgment normal.     Comments: Insight intact. No delusions.  Increased rumination noted on exam     Lab Review:     Component Value Date/Time   NA 140 02/23/2019 0920   K 4.3 02/23/2019 0920   CL 105 02/23/2019 0920   CO2 26 02/23/2019 0920   GLUCOSE 95 02/23/2019 0920   BUN 18 02/23/2019 0920   CREATININE 0.72 02/23/2019 0920   CALCIUM 9.9 02/23/2019 0920   PROT 6.7 02/23/2019 0920   AST 19 02/23/2019 0920   ALT 18 02/23/2019 0920   BILITOT 0.8 02/23/2019 0920   GFRNONAA 76 02/23/2019 0920   GFRAA 89 02/23/2019 0920       Component Value Date/Time   WBC 4.3 02/23/2019 0920   RBC 4.36 02/23/2019 0920   HGB 12.0 02/23/2019 0920   HCT 36.0 02/23/2019 0920   PLT 173 02/23/2019 0920   MCV 82.6 02/23/2019 0920   MCH 27.5 02/23/2019 0920   MCHC 33.3 02/23/2019 0920   RDW 14.3 02/23/2019 0920   LYMPHSABS 989 02/23/2019 0920   EOSABS 194 02/23/2019 0920   BASOSABS 52 02/23/2019 0920    No results found for: POCLITH, LITHIUM   No results found for: PHENYTOIN, PHENOBARB, VALPROATE, CBMZ   .res Assessment: Plan:   Encouraged patient to resume Remeron 15 mg at bedtime to improve anxiety and depression since  patient has had worsening rumination, anxiety, depression, and decreased appetite with significant weight loss since she stopped Remeron 1 month ago.  Discussed that Remeron may be taken in combination with alprazolam, since patient reports that alprazolam has been most effective for her insomnia and had stopped taking Remeron for this reason.  Patient agrees to resume Remeron 15 mg at bedtime. Will continue sertraline 100 mg daily for depression and anxiety. Patient will continue alprazolam as prescribed by PCP. Agree with plan to resume psychotherapy with Rockne Menghini, LCSW. Patient to follow-up with this provider in 6 weeks or sooner if clinically indicated. Patient advised to contact office with any questions, adverse effects, or acute worsening in signs and symptoms.  Melissa [name] was seen today for anxiety and depression.  Diagnoses and all orders for this visit:  Grief reaction -     mirtazapine (REMERON) 15 MG tablet; Take 1/2-1 tab po QHS -     sertraline (ZOLOFT) 100 MG tablet; Take 1 tablet (100 mg total) by mouth daily.  Insomnia due to other mental disorder -     mirtazapine (REMERON) 15 MG tablet; Take 1/2-1 tab po QHS  Generalized anxiety disorder -     sertraline (ZOLOFT) 100 MG tablet; Take 1 tablet (100 mg total) by mouth daily.     Please see After Visit Summary for patient specific instructions.  Future Appointments  Date Time Provider Department Center  05/22/2019 10:00 AM Mathis Fare, LCSW CP-CP None  06/28/2019  9:30 AM Corie Chiquito, PMHNP CP-CP None  08/24/2019  9:15 AM Baxley, Luanna Cole, MD M[facility]MJB MJB  08/31/2019 10:45 AM Baxley,  Luanna Cole, MD M[facility]MJB MJB    No orders of the defined types were placed in this encounter.   -------------------------------

## 2019-05-22 ENCOUNTER — Ambulatory Visit (INDEPENDENT_AMBULATORY_CARE_PROVIDER_SITE_OTHER): Payer: Medicare Other | Admitting: Psychiatry

## 2019-05-22 ENCOUNTER — Other Ambulatory Visit: Payer: Self-pay

## 2019-05-22 DIAGNOSIS — F4321 Adjustment disorder with depressed mood: Secondary | ICD-10-CM | POA: Diagnosis not present

## 2019-05-22 NOTE — Progress Notes (Addendum)
[facility] Counselor/Therapist Progress Note  Patient ID: Melissa [name], MRN: [id number],    Date: 05/22/2019  Time Spent: 60 minutes   10:00am to 11:00am  Treatment Type: Individual Therapy  Reported Symptoms:  Mixed grief re: husband's death; anxiety, trust issues, overwhelmed with moving process, diminished appetite and weight loss, living with daughter and family   Mental Status Exam:  Appearance:   Well Groomed     Behavior:  Appropriate and Sharing  Motor:  Normal  Speech/Language:   Normal Rate  Affect:  anxious, depression, grief  Mood:  anxious and depressed  Thought process:  normal  Thought content:    WNL  Sensory/Perceptual disturbances:    WNL  Orientation:  oriented to person, place, time/date, situation, day of week, month of year and year  Attention:  Good  Concentration:  Good  Memory:  Recent;   some "feeling short term memory forgetfulness" has been under lots of stress  Fund of knowledge:   Good  Insight:    Good  Judgment:   Good  Impulse Control:  Good   Risk Assessment: Danger to Self:  No Self-injurious Behavior: No Danger to Others: No Duty to Warn:no Physical Aggression / Violence:No  Access to Firearms a concern: No  Gang Involvement:No   Subjective:  Patient in today feeling stressed, anxious, and some grief re: husband's death and some complicating relationship issues. Also moved into different home more recently which was a big change and stressor for patient.  Interventions: Solution-Oriented/Positive Psychology and Ego-Supportive  Diagnosis:   ICD-10-CM   1. Grief reaction  F43.21     Plan:  Patient not signing tx plan on computer screen due to COVID.  Treatment Goals: Goals remain on tx plan while patient works on strategies to [place] her goals.  Progress is documented each session in "Progress" section.  Long term goal: Reduce overall level, frequency, and intensity of anxiety so that daily functioning is not  impaired.  Short term goal: Increase understanding of the messages/beliefs that produce the worry and anxiety.  Strategy: 1.Develop behavioral and cognitive strategies to reduce or eliminate the anxiety. 2. Help client develop healthy self-talk as a means to help manage the anxiety. 3. Work on unresolved grief and other issue re: husband's death.  PROGRESS: Patient in today after not being seen in therapy for 3-4 months.  Had some emotional decline due to some re-surfacing of grief issues regarding husband's death and some complicated grief and anxiety, along with some depression, but she states the anxiety is stronger.  She shared more recently how she has been coping and issues that are causing her distress and we processed her anxiety and grief, then updated her tx goal plan.  New goals reviewed and patient reports feeling better after re-connecting today and talking about all the changes she has gone through since last appt. We updated and talked about where to start in getting on track with goals that can help her make changes to feel better.  She is to self-monitor her thoughts, paying close attention to negative and anxious thoughts as they arise.  Next step for her is to interrupt those thoughts and try to replace them with more positive, reality-based thoughts.  Not sure patient will be able to accomplish both of these steps--if not, she knows that is ok at least recognize when thoughts are negative or anxiety-provoking and interrupt them. Will pick up on this next session and can review the replacing of such thoughts  at that time.  Also discussed the need (and gave examples) for positive self-talk.  Denies any SI.  (Added 1 more Strategy today as part of goal plan.)  Next appt. Within 2 weeks.   Shanon Ace, LCSW

## 2019-06-09 DIAGNOSIS — Z23 Encounter for immunization: Secondary | ICD-10-CM | POA: Diagnosis not present

## 2019-06-12 ENCOUNTER — Other Ambulatory Visit: Payer: Self-pay

## 2019-06-12 ENCOUNTER — Ambulatory Visit (INDEPENDENT_AMBULATORY_CARE_PROVIDER_SITE_OTHER): Payer: Medicare Other | Admitting: Psychiatry

## 2019-06-12 DIAGNOSIS — F411 Generalized anxiety disorder: Secondary | ICD-10-CM

## 2019-06-12 NOTE — Progress Notes (Addendum)
      [facility] Counselor/Therapist Progress Note  Patient ID: Melissa [name], MRN: [id number],    Date: 06/12/2019  Time Spent: 60 minutes   10:00am to 11:00am  Treatment Type: Individual Therapy  Reported Symptoms: anxiety, difficulty adjusting to having moved again and is living with daughter and husband.  Mental Status Exam:  Appearance:   Well Groomed     Behavior:  Appropriate and Sharing  Motor:  Normal  Speech/Language:   Normal Rate  Affect:  anxious  Mood:  anxious and depressed  Thought process:  goal directed  Thought content:    WNL  Sensory/Perceptual disturbances:    WNL  Orientation:  oriented to person, place, time/date, situation, day of week, month of year and year  Attention:  Good  Concentration:  Good  Memory:  Pt. reports short-term memory issues; has communicated that to her PCP; pt currently is 85 yrs old  Fund of knowledge:   Good  Insight:    Good  Judgment:   Good  Impulse Control:  Good   Risk Assessment: Danger to Self:  No Self-injurious Behavior: No Danger to Others: No Duty to Warn:no Physical Aggression / Violence:No  Access to Firearms a concern: No  Gang Involvement:No   Subjective:   Patient reports anxiety primarily since last appt.  Still struggling some with the situation with her deceased husband discussed previously. Not wanting to be a burden to her family by living with them, and there have been no indications of such, in fact they moved so that they could all live together in same house.  Patient proud that she "can still drive at 85 yrs old except I don't drive on highways."  Doubt, anxious, some sadness at times as it's hard not to think about past incident involving husband.  Is some better but "still have a ways to go in working on that."   Interventions: Solution-Oriented/Positive Psychology and Ego-Supportive  Diagnosis:   ICD-10-CM   1. Generalized anxiety disorder  F41.1     Plan:  Patient not signing tx plan  on computer screen due to [place].  Treatment Goals: Goals remain on tx plan while patient works on strategies to [place] her goals.  Progress is documented each session in "Progress" section.  Long term goal: Reduce overall level, frequency, and intensity of anxiety so that daily functioning is not impaired.  Short term goal: Increase understanding of the messages/beliefs that produce the worry and anxiety.  Strategy: 1.Develop behavioral and cognitive strategies to reduce or eliminate the anxiety. 2. Help client develop healthy self-talk as a means to help manage the anxiety. 3. Work on unresolved grief and other issue re: husband's death.  PROGRESS: Patient in today with continued anxiety.  Worked some more on unresolved grief of husband's death as well as some trust issues that developed due to prior incident.  Hard for patient to move forward but she is making process as evidenced by her eating better and no longer losing weight, and is not isolating quite as much.   Also worked on getting her self-talk more positive and reality-based.  Shared several  Examples of more reality-based/positive self-talk and she is to practice this more on her own.  Goal review and progress noted with patient.   Next appt within 2-3 weeks.   Melissa Ace, LCSW

## 2019-06-28 ENCOUNTER — Other Ambulatory Visit: Payer: Self-pay

## 2019-06-28 ENCOUNTER — Ambulatory Visit (INDEPENDENT_AMBULATORY_CARE_PROVIDER_SITE_OTHER): Payer: Medicare Other | Admitting: Psychiatry

## 2019-06-28 ENCOUNTER — Encounter: Payer: Self-pay | Admitting: Psychiatry

## 2019-06-28 DIAGNOSIS — F411 Generalized anxiety disorder: Secondary | ICD-10-CM

## 2019-06-28 DIAGNOSIS — F4321 Adjustment disorder with depressed mood: Secondary | ICD-10-CM | POA: Diagnosis not present

## 2019-06-28 DIAGNOSIS — F99 Mental disorder, not otherwise specified: Secondary | ICD-10-CM

## 2019-06-28 DIAGNOSIS — F5105 Insomnia due to other mental disorder: Secondary | ICD-10-CM

## 2019-06-28 MED ORDER — SERTRALINE HCL 100 MG PO TABS: 100.0000 mg | ORAL_TABLET | Freq: Every day | ORAL | 0 refills | Status: DC

## 2019-06-28 MED ORDER — MIRTAZAPINE 15 MG PO TABS: ORAL_TABLET | ORAL | 0 refills | Status: DC

## 2019-06-28 NOTE — Progress Notes (Signed)
Melissa [name] [id number] 1931/05/28 85 y.o.  Subjective:   Patient ID:  Melissa [name] is a 85 y.o. (DOB 1931-06-30) female.  Chief Complaint:  Chief Complaint  Patient presents with  . Follow-up    Anxiety, depression, insomnia    HPI Melissa [name] presents to the office today for follow-up of depression, anxiety, and insomnia. She reports that she has resumed Remeron. Reports that she is sleeping well and no longer awakening during the night with anxious thoughts. She reports some improvement in her mood. Denies persistent sad mood. "Things that have happened in the past I cannot change, so I am going to have to accept them and move on." She reports that sometimes she is able to control anxious thoughts and reports that rumination is less. Denies any recent physical s/s of anxiety. She reports that organizing her bedroom has helped her feel less anxious. She reports that she is bothered by clutter. Continues to take Alprazolam before driving and that this is effective for her. She reports that she did not experience any anxiety on her drive to appointment today. Appetite has been good. She reports that her energy is average. She reports that her memory is not as good as she would like. She reports that she recently had difficulty understanding something that her daughter was explaining to her. Denies SI.   She reports that she continues to adjust to move and living with daughter and her family. Reports that daughter cooks meals most evenings.   Past Psychiatric Medication Trials: Olanzapine Alprazolam- uses as needed for anxiety Sertraline- side effects with doses above 100 mg po qd Remeron- partially effective    Review of Systems:  Review of Systems  Musculoskeletal: Positive for back pain. Negative for gait problem.  Neurological: Negative for tremors.  Psychiatric/Behavioral:       Please refer to HPI   Denies any falls since her move.   Medications: I have  reviewed the patient's current medications.  Current Outpatient Medications  Medication Sig Dispense Refill  . acetaminophen (TYLENOL) 325 MG tablet Take 250 mg by mouth every 6 (six) hours as needed.    . ALPRAZolam (XANAX) 0.5 MG tablet TAKE 1/2 TABLET to One tablet BY MOUTH EVERY MORNING AND  One half to one table  BY MOUTH QHS 60 tablet 2  . atorvastatin (LIPITOR) 40 MG tablet TAKE 1 TABLET BY MOUTH DAILY 90 tablet 3  . cholecalciferol (VITAMIN D) 1000 units tablet Take 1,000 Units by mouth daily.    . magnesium 30 MG tablet Take 250 mg by mouth daily.    . mirtazapine (REMERON) 15 MG tablet Take 1/2-1 tab po QHS 90 tablet 0  . Omega-3 Fatty Acids (FISH OIL) 1200 MG CAPS Take 1,200 mg by mouth daily.    . sertraline (ZOLOFT) 100 MG tablet Take 1 tablet (100 mg total) by mouth daily. 90 tablet 0  . TURMERIC PO Take by mouth.    . valsartan-hydrochlorothiazide (DIOVAN-HCT) 80-12.5 MG tablet Take 1 tablet by mouth daily. 90 tablet 3  . vitamin B-12 (CYANOCOBALAMIN) 250 MCG tablet Take 250 mcg by mouth daily.    . vitamin E 1000 UNIT capsule Take 1,000 Units by mouth daily.    Marland Kitchen OLANZapine (ZYPREXA) 5 MG tablet Take 1 tablet (5 mg total) by mouth at bedtime. (Patient not taking: Reported on 05/17/2019) 30 tablet 5   No current facility-administered medications for this visit.     Medication Side Effects: None  Allergies:  Allergies  Allergen Reactions  .  Aspirin Other (See Comments)    Nausea, dizziness, malaise.  . Aspirin Nausea Only  . Codeine   . Tramadol     Past Medical History:  Diagnosis Date  . Allergy   . Anxiety   . Bladder dysfunction   . Bulging lumbar disc   . Depression   . Hyperlipidemia   . Hypertension   . Low back pain   . Lumbar radiculopathy   . Spinal stenosis     Family History  Problem Relation Age of Onset  . Arthritis Mother   . Alcohol abuse Father   . Anxiety disorder Sister   . Anxiety disorder Niece   . Anxiety disorder Granddaughter      Social History   Socioeconomic History  . Marital status: Widowed    Spouse name: Not on file  . Number of children: Not on file  . Years of education: Not on file  . Highest education level: Not on file  Occupational History  . Not on file  Social Needs  . Financial resource strain: Not on file  . Food insecurity    Worry: Not on file    Inability: Not on file  . Transportation needs    Medical: Not on file    Non-medical: Not on file  Tobacco Use  . Smoking status: Never Smoker  . Smokeless tobacco: Never Used  Substance and Sexual Activity  . Alcohol use: Yes    Comment: Reports 1/2 glass of wine periodically  . Drug use: Never  . Sexual activity: Not on file  Lifestyle  . Physical activity    Days per week: Not on file    Minutes per session: Not on file  . Stress: Not on file  Relationships  . Social Musicianconnections    Talks on phone: Not on file    Gets together: Not on file    Attends religious service: Not on file    Active member of club or organization: Not on file    Attends meetings of clubs or organizations: Not on file    Relationship status: Not on file  . Intimate partner violence    Fear of current or ex partner: Not on file    Emotionally abused: Not on file    Physically abused: Not on file    Forced sexual activity: Not on file  Other Topics Concern  . Not on file  Social History Narrative   ** Merged History Encounter **        Past Medical History, Surgical history, Social history, and Family history were reviewed and updated as appropriate.   Please see review of systems for further details on the patient's review from today.   Objective:   Physical Exam:  Wt 131 lb (59.4 kg)   BMI 27.38 kg/m   Physical Exam Constitutional:      General: She is not in acute distress.    Appearance: She is well-developed.  Musculoskeletal:        General: No deformity.  Neurological:     Mental Status: She is alert and oriented to person,  place, and time.     Coordination: Coordination normal.  Psychiatric:        Attention and Perception: Attention and perception normal. She does not perceive auditory or visual hallucinations.        Mood and Affect: Mood normal. Mood is not anxious or depressed. Affect is not labile, blunt, angry or inappropriate.  Speech: Speech normal.        Behavior: Behavior normal.        Thought Content: Thought content normal. Thought content does not include homicidal or suicidal ideation. Thought content does not include homicidal or suicidal plan.        Cognition and Memory: Cognition and memory normal.        Judgment: Judgment normal.     Comments: Insight intact. No delusions.      Lab Review:     Component Value Date/Time   NA 140 02/23/2019 0920   K 4.3 02/23/2019 0920   CL 105 02/23/2019 0920   CO2 26 02/23/2019 0920   GLUCOSE 95 02/23/2019 0920   BUN 18 02/23/2019 0920   CREATININE 0.72 02/23/2019 0920   CALCIUM 9.9 02/23/2019 0920   PROT 6.7 02/23/2019 0920   AST 19 02/23/2019 0920   ALT 18 02/23/2019 0920   BILITOT 0.8 02/23/2019 0920   GFRNONAA 76 02/23/2019 0920   GFRAA 89 02/23/2019 0920       Component Value Date/Time   WBC 4.3 02/23/2019 0920   RBC 4.36 02/23/2019 0920   HGB 12.0 02/23/2019 0920   HCT 36.0 02/23/2019 0920   PLT 173 02/23/2019 0920   MCV 82.6 02/23/2019 0920   MCH 27.5 02/23/2019 0920   MCHC 33.3 02/23/2019 0920   RDW 14.3 02/23/2019 0920   LYMPHSABS 989 02/23/2019 0920   EOSABS 194 02/23/2019 0920   BASOSABS 52 02/23/2019 0920    No results found for: POCLITH, LITHIUM   No results found for: PHENYTOIN, PHENOBARB, VALPROATE, CBMZ   .res Assessment: Plan:   Patient exhibits improved depression, anxiety, rumination, and insomnia since resuming Remeron 15 mg at bedtime. Recommend continuing Remeron 15 mg at bedtime. Will continue sertraline 100 mg daily for mood and anxiety signs and symptoms. Recommend continuing alprazolam as  needed which is managed by PCP Recommend continuing psychotherapy with Rockne Menghini, LCSW. Patient to follow-up with this provider in 3 months or sooner if clinically indicated. Patient advised to contact office with any questions, adverse effects, or acute worsening in signs and symptoms.  Savannaha was seen today for follow-up.  Diagnoses and all orders for this visit:  Generalized anxiety disorder -     sertraline (ZOLOFT) 100 MG tablet; Take 1 tablet (100 mg total) by mouth daily.  Grief reaction -     sertraline (ZOLOFT) 100 MG tablet; Take 1 tablet (100 mg total) by mouth daily. -     mirtazapine (REMERON) 15 MG tablet; Take 1/2-1 tab po QHS  Insomnia due to other mental disorder -     mirtazapine (REMERON) 15 MG tablet; Take 1/2-1 tab po QHS     Please see After Visit Summary for patient specific instructions.  Future Appointments  Date Time Provider Department Center  07/12/2019  9:00 AM Mathis Fare, LCSW [facility] None  08/24/2019  9:15 AM Baxley, Luanna Cole, MD [facility]  08/31/2019 10:45 AM Baxley, Luanna Cole, MD [facility]    No orders of the defined types were placed in this encounter.   -------------------------------

## 2019-06-28 NOTE — Progress Notes (Signed)
      [facility] Counselor/Therapist Progress Note  Patient ID: Melissa [name], MRN: [id number],    Date: 06/28/2019  Time Spent: 60 minutes  10:03am to 11:00am  Treatment Type: Individual Therapy  Reported Symptoms: anxiety  Mental Status Exam:  Appearance:   Well Groomed     Behavior:  Appropriate and Sharing  Motor:  Normal  Speech/Language:   Normal Rate  Affect:  anxious  Mood:  anxious  Thought process:  goal directed  Thought content:    WNL  Sensory/Perceptual disturbances:    WNL  Orientation:  oriented to person, place, time/date, situation, day of week, month of year and year  Attention:  Good  Concentration:  Good  Memory:  patient reports some immediate memory "forgetfulness"  Fund of knowledge:   Good  Insight:    Good  Judgment:   Good  Impulse Control:  Good   Risk Assessment: Danger to Self:  No Self-injurious Behavior: No Danger to Others: No Duty to Warn:no Physical Aggression / Violence:No  Access to Firearms a concern: No  Gang Involvement:No   Subjective: Patient in today reporting some progress as I have been able to be more organized. Still thinks about husband and "other details that I can't change, but not as much now." Anxiety some better at times.    Interventions: Solution-Oriented/Positive Psychology and Ego-Supportive  Diagnosis:   ICD-10-CM   1. Generalized anxiety disorder  F41.1     Plan: Patient not signing tx plan on computer screen due to [place].  Treatment Goals: Goals remain on tx plan while patient works on strategies to [place] her goals. Progress is documented each session in "Progress" section.  Long term goal: Reduce overall level, frequency, and intensity of anxiety so that daily functioning is not impaired.  Short term goal: Increase understanding of the messages/beliefs that produce the worry and anxiety.  Strategy: 1.Develop behavioral and cognitive strategies to reduce or eliminate the  anxiety. 2. Help client develop healthy self-talk as a means to help manage the anxiety. 3. Work on unresolved grief and other issue re: husband's death.  PROGRESS: Patient is showing some progress in her talk about issues influencing her anxiety and seems to have developed some acceptance of her issues. Feeling some decrease in unresolved grief of husband.  Eating healthier, not losing weight. Worked in session today on more positive self-talk and leaving "the past in the past". Living with daughter and that is going ok as they get settled into new home more and more.  Brother and sister both live in [place] and stay in phone contact with patient.  Patient more diligent in working on short term goal and trying to better understand which beliefs and messages lead her to feel more anxious. Encouraged her to interrupt those specific anxious beliefs/thoughts and change them to be more positive, calming, and reality based thoughts that do not lead to anxious feelings.  To be at small family gathering on Thanksgiving.  Goal review and progress/efforts/good attitude noted with patient.   Next appt within 2-3 weeks.   Shanon Ace, LCSW

## 2019-07-12 ENCOUNTER — Other Ambulatory Visit: Payer: Self-pay

## 2019-07-12 ENCOUNTER — Ambulatory Visit (INDEPENDENT_AMBULATORY_CARE_PROVIDER_SITE_OTHER): Payer: Medicare Other | Admitting: Psychiatry

## 2019-07-12 DIAGNOSIS — F411 Generalized anxiety disorder: Secondary | ICD-10-CM | POA: Diagnosis not present

## 2019-07-12 NOTE — Progress Notes (Signed)
      [facility] Counselor/Therapist Progress Note  Patient ID: Melissa [name], MRN: [id number],    Date: 07/12/2019  Time Spent: 60 minutes   9:00am to 10:00am  Treatment Type: Individual Therapy  Reported Symptoms: anxiety, some occasional grief/sadness  Mental Status Exam:  Appearance:   Neat     Behavior:  Appropriate and Sharing  Motor:  Normal  Speech/Language:   Clear and Coherent  Affect:  anxious  Mood:  anxious  Thought process:  goal directed  Thought content:    WNL  Sensory/Perceptual disturbances:    WNL  Orientation:  oriented to person, place, time/date, situation, day of week, month of year and year  Attention:  Good  Concentration:  Good  Memory:  Pt reports short term memory issues.  PCP is aware.  Fund of knowledge:   Good  Insight:    Good  Judgment:   Good  Impulse Control:  Good   Risk Assessment: Danger to Self:  No Self-injurious Behavior: No Danger to Others: No Duty to Warn:no Physical Aggression / Violence:No  Access to Firearms a concern: No  Gang Involvement:No   Subjective: Patient in today reporting anxiety and some grief/sadness that she is continuing to work on (death of husband)  Interventions: Solution-Oriented/Positive Psychology and Ego-Supportive  Diagnosis:   ICD-10-CM   1. Generalized anxiety disorder  F41.1     Plan: Patient not signing tx plan on computer screen due to [place].  Treatment Goals: Goals remain on tx plan while patient works on strategies to [place] her goals. Progress is documented each session in "Progress" section.  Long term goal: Reduce overall level, frequency, and intensity of anxiety so that daily functioning is not impaired.  Short term goal: Increase understanding of the messages/beliefs that produce the worry and anxiety.  Strategy: 1.Develop behavioral and cognitive strategies to reduce or eliminate the anxiety. 2. Help client develop healthy self-talk as a means to help  manage the anxiety. 3. Work on unresolved grief and other issue re: husband's death.  PROGRESS: Patient is making progress on her goals. Processed more of her unresolved grief issues and is making progress as noted by the way she spoke more about her need to let go and the ways she is following up on strategies to do that, understanding it is a process.  Seemed stronger today on recent work to support "the past is the past and is not here in the present", and the need to more fully accept that, and she is progressing.  Today she was proud of herself "at 76, I found another route to get here to your office and didn't get lost." Continues eating healthier, walking some "just to be moving more"  Eating healthier, not losing weight.  Still working on self-talk, especially since our work last session on that. Did some more work on it today and acknowledges some of her improvement. Staying in touch with friends from [place], and living with her adult daughter and her family involved adjustments and benefits, especially so she is not living alone at this point.  Goal review and progress/efforts noted with patient.    Next appt within 3 weeks.   Shanon Ace, LCSW

## 2019-07-18 ENCOUNTER — Telehealth: Payer: Self-pay | Admitting: Internal Medicine

## 2019-07-18 MED ORDER — ALPRAZOLAM 0.5 MG PO TABS: ORAL_TABLET | ORAL | 5 refills | Status: DC

## 2019-07-18 NOTE — Telephone Encounter (Signed)
Melissa [name] [phone]   Melissa [name] called to say that the pharmacy had told her to call her doctor for refills on below listed medication.  [id number] - [place], [place] - [address] AT [place] [phone] (Phone) [phone] (Fax)     ALPRAZolam Duanne Moron) 0.5 MG tablet

## 2019-08-15 ENCOUNTER — Other Ambulatory Visit: Payer: Self-pay | Admitting: Psychiatry

## 2019-08-15 DIAGNOSIS — F4321 Adjustment disorder with depressed mood: Secondary | ICD-10-CM

## 2019-08-15 DIAGNOSIS — F411 Generalized anxiety disorder: Secondary | ICD-10-CM

## 2019-08-16 ENCOUNTER — Ambulatory Visit (INDEPENDENT_AMBULATORY_CARE_PROVIDER_SITE_OTHER): Payer: Medicare Other | Admitting: Internal Medicine

## 2019-08-16 ENCOUNTER — Encounter: Payer: Self-pay | Admitting: Internal Medicine

## 2019-08-16 ENCOUNTER — Telehealth: Payer: Self-pay

## 2019-08-16 ENCOUNTER — Other Ambulatory Visit: Payer: Self-pay

## 2019-08-16 VITALS — Temp 98.2°F | Ht <= 58 in | Wt 134.0 lb

## 2019-08-16 DIAGNOSIS — U071 COVID-19: Secondary | ICD-10-CM

## 2019-08-16 NOTE — Progress Notes (Signed)
   Subjective:    Patient ID: Melissa [name], female    DOB: March 16, 1931, 86 y.o.   MRN: [id number]  HPI [facility] and tested positive for Covid 19. Had been complaining of leg pain but has history of leg and back pain. Daughter purchased pulse ox and level is 96%. Patient is quarantined at their home upstairs and is doing well with no headache, darrjea, nausea or vomiting. Eating and drinking OK just complaining of extreme fatigue.  Virtual visit arranged today.  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  Daughter gives the history today.  Daughter and patient consented to this visit in this format.  Patient has history of anxiety disorder  It is not clear where patient was exposed to COVID-19.  She does go out to the grocery store in the drugstore.  1 granddaughter works in a jail and gets tested regularly and has been negative.  Great-grandchildren need to be tested.  Daughter and son-in-law just got tested and results are pending but they are asymptomatic.Marland Kitchen  No travel history.  Review of Systems no reported nausea vomiting fever or chills just some generalized weakness.  No shortness of breath     Objective:   Physical Exam Reportedly afebrile.  No acute distress.       Assessment & Plan:  COVID-19 positive  Plan: Continue to quarantine at home for 10 days.  Continue to monitor pulse oximetry.  Daughter will call if symptoms worsen.  Other family members getting tested.

## 2019-08-16 NOTE — Telephone Encounter (Signed)
Patient's daughter called states patient tested positive for COVID, she is feeling weak no other symptoms. She has been checking her O2 and yesterday evening it was 97%, she would like to talk to you she is worried about her mother. She got tested on Saturday and results came back yesterday. Okay for virtual visit?

## 2019-08-16 NOTE — Telephone Encounter (Signed)
Set up virtual visit at noon

## 2019-08-16 NOTE — Telephone Encounter (Signed)
scheduled

## 2019-08-16 NOTE — Patient Instructions (Signed)
Continue to monitor symptoms and especially pulse oximetry.  Call if symptoms worsen.  Stay well-hydrated.  Have other close  family members tested.

## 2019-08-18 ENCOUNTER — Ambulatory Visit: Payer: Medicare Other | Admitting: Psychiatry

## 2019-08-24 ENCOUNTER — Other Ambulatory Visit: Payer: Medicare Other | Admitting: Internal Medicine

## 2019-08-24 DIAGNOSIS — Z03818 Encounter for observation for suspected exposure to other biological agents ruled out: Secondary | ICD-10-CM | POA: Diagnosis not present

## 2019-08-28 NOTE — Telephone Encounter (Signed)
Faxed positive COVID-19 results to [facility] 

## 2019-08-31 ENCOUNTER — Ambulatory Visit: Payer: Medicare Other | Admitting: Internal Medicine

## 2019-09-12 ENCOUNTER — Other Ambulatory Visit: Payer: Self-pay | Admitting: Internal Medicine

## 2019-10-05 ENCOUNTER — Other Ambulatory Visit: Payer: Self-pay

## 2019-10-05 DIAGNOSIS — F99 Mental disorder, not otherwise specified: Secondary | ICD-10-CM

## 2019-10-05 DIAGNOSIS — F5105 Insomnia due to other mental disorder: Secondary | ICD-10-CM

## 2019-10-05 DIAGNOSIS — F4321 Adjustment disorder with depressed mood: Secondary | ICD-10-CM

## 2019-10-05 MED ORDER — MIRTAZAPINE 15 MG PO TABS: ORAL_TABLET | ORAL | 0 refills | Status: DC

## 2019-10-29 ENCOUNTER — Other Ambulatory Visit: Payer: Self-pay | Admitting: Internal Medicine

## 2019-11-15 ENCOUNTER — Other Ambulatory Visit: Payer: Self-pay

## 2019-11-15 DIAGNOSIS — F411 Generalized anxiety disorder: Secondary | ICD-10-CM

## 2019-11-15 DIAGNOSIS — F4321 Adjustment disorder with depressed mood: Secondary | ICD-10-CM

## 2019-11-15 MED ORDER — SERTRALINE HCL 100 MG PO TABS: ORAL_TABLET | ORAL | 0 refills | Status: DC

## 2019-11-21 ENCOUNTER — Telehealth: Payer: Self-pay | Admitting: Internal Medicine

## 2019-11-21 NOTE — Telephone Encounter (Signed)
scheduled

## 2019-11-21 NOTE — Telephone Encounter (Signed)
See tomorrow

## 2019-11-21 NOTE — Telephone Encounter (Signed)
Melissa [name] [phone]  Windi called today she fell last August and in September she started having having troubles with her legs, it has gotten worse lately, she is also says that her feet are always cold. Would like to come in to be seen.

## 2019-11-22 ENCOUNTER — Encounter: Payer: Self-pay | Admitting: Internal Medicine

## 2019-11-22 ENCOUNTER — Other Ambulatory Visit: Payer: Self-pay

## 2019-11-22 ENCOUNTER — Ambulatory Visit (INDEPENDENT_AMBULATORY_CARE_PROVIDER_SITE_OTHER): Payer: Medicare Other | Admitting: Internal Medicine

## 2019-11-22 VITALS — BP 120/70 | HR 95 | Temp 98.2°F | Ht <= 58 in | Wt 134.0 lb

## 2019-11-22 DIAGNOSIS — F411 Generalized anxiety disorder: Secondary | ICD-10-CM | POA: Diagnosis not present

## 2019-11-22 DIAGNOSIS — M4807 Spinal stenosis, lumbosacral region: Secondary | ICD-10-CM | POA: Diagnosis not present

## 2019-11-22 DIAGNOSIS — M5416 Radiculopathy, lumbar region: Secondary | ICD-10-CM

## 2019-11-22 DIAGNOSIS — I10 Essential (primary) hypertension: Secondary | ICD-10-CM

## 2019-11-22 DIAGNOSIS — E782 Mixed hyperlipidemia: Secondary | ICD-10-CM

## 2019-11-22 MED ORDER — PREDNISONE 10 MG PO TABS: ORAL_TABLET | ORAL | 0 refills | Status: DC

## 2019-11-22 NOTE — Progress Notes (Signed)
   Subjective:    Patient ID: Melissa [name], female    DOB: October 14, 1930, 86 y.o.   MRN: [id number]  HPI Patient seen by Dr. Ethelene Hal February 2020 for history of lumbar spinal stenosis.  She was given a short course of prednisone at the time which may have helped temporarily.  No recent imaging studies. Cannot have MRI due to presence of spinal nerve stimulator.  Patient has generalized anxiety disorder and [facility]  She resides with her daughter and daughter's family after her husband passed away from complications of malignancy at [facility] in 2019.  Her husband was the eighth Economist of [employer].  Patient has longstanding history of lumbar radiculopathy and spinal stenosis.  She says she had an ablation procedure for that in [place] a number of years ago.  History of bladder dysfunction with urinary frequency and had sacral nerve stimulator placed in [place] a number of years ago reportedly for bladder issues.  She said the device caused some odd vibrations in her vagina and did not help her bladder problem.  History of hypertension and hyperlipidemia.  She did see Dr. Horald Chestnut  Review of Systems patient complains bitterly of left lower back pain extending into left leg     Objective:   Physical Exam  Her muscle strength in both lower extremities is within normal limits. Blood pressure 120/70 pulse 95 temperature 98.2 degrees pulse oximetry 98% BMI 28.02.  Petite female seems to have kyphosis.     Assessment & Plan:  Left back pain with left radiculopathy-suspect spinal stenosis  Anxiety state treated with Xanax  Hyperlipidemia treated with statin medication  Essential hypertension-stable on current regimen  Plan: Since she has a spinal nerve stimulator in place she cannot have MRI.  She will have CT of the lumbar spine without contrast.  Further recommendations to follow.  In the meantime I have given  her a tapering course of prednisone 10 mg tablets (#21 tabs) starting with 60 mg and decreasing by 10 mg daily for 6 days.  30 minutes spent with patient with history taking examination and reviewing records per Dr. Ethelene Hal.  Explained need for CT scan for further evaluation and prednisone taper.  Has a physical exam scheduled for July but would like to see her back after results of CT or 9.

## 2019-11-23 NOTE — Patient Instructions (Signed)
CT scan of the LS spine ordered for evaluation of back pain with left radiculopathy.  Follow-up after that study.  Take prednisone in tapering course as directed.

## 2019-12-15 ENCOUNTER — Ambulatory Visit
Admission: RE | Admit: 2019-12-15 | Discharge: 2019-12-15 | Disposition: A | Discharge status: Home / Self Care | Payer: Medicare Other | Source: Ambulatory Visit | Attending: Internal Medicine | Admitting: Internal Medicine

## 2019-12-15 DIAGNOSIS — M4807 Spinal stenosis, lumbosacral region: Secondary | ICD-10-CM

## 2019-12-15 DIAGNOSIS — M5416 Radiculopathy, lumbar region: Secondary | ICD-10-CM

## 2019-12-15 DIAGNOSIS — M48061 Spinal stenosis, lumbar region without neurogenic claudication: Secondary | ICD-10-CM | POA: Diagnosis not present

## 2019-12-18 ENCOUNTER — Encounter: Payer: Self-pay | Admitting: Internal Medicine

## 2019-12-18 ENCOUNTER — Ambulatory Visit (INDEPENDENT_AMBULATORY_CARE_PROVIDER_SITE_OTHER): Payer: Medicare Other | Admitting: Internal Medicine

## 2019-12-18 ENCOUNTER — Other Ambulatory Visit: Payer: Self-pay

## 2019-12-18 VITALS — BP 90/60 | HR 93 | Temp 98.3°F | Ht <= 58 in | Wt 137.0 lb

## 2019-12-18 DIAGNOSIS — M4807 Spinal stenosis, lumbosacral region: Secondary | ICD-10-CM | POA: Diagnosis not present

## 2019-12-18 DIAGNOSIS — M5416 Radiculopathy, lumbar region: Secondary | ICD-10-CM

## 2019-12-18 NOTE — Progress Notes (Signed)
   Subjective:    Patient ID: Melissa [name], female    DOB: 04/05/1931, 86 y.o.   MRN: [id number]  HPI 86 year old Female with chronic lumbar back pain seen today for discussion regarding results of recent CT of the LS spine.  She is accompanied by her daughter at my request.  Patient has  low back pain almost constantly.  She has bilateral radiculopathy.  Back pain  interferes with a lot of her activities.  Daughter says she insists on doing some yard work.  Patient says she enjoys doing yard work.  Daughter says that her mother pays the price for the yard work with worsening of her back pain.  Patient has anxiety disorder.  She takes Xanax for insomnia and anxiety.  She is a widow.  She moved in with her daughter and daughter's family after her husband passed away.  She seems happy there.  She has her own quarters upstairs where she can spend some private time.  She has seen Dr. Ethelene Hal in the past for chronic low back pain.  In February 2020 she received short course of prednisone which helped temporarily.  She cannot have a MRI due to presence of sacral spinal nerve stimulator which was placed patient says due to bladder dysfunction.  This was placed when she was living in [place] a number of years ago.  She had the device turned off because it was causing some odd vibrations in her vagina and did not really help her bladder problem.  History of hypertension and hyperlipidemia.  CT scan shows severe osteoarthritis of bilateral SI joints.  Has broad-based disc bulge at T11-T12.  Broad-based disc osteophyte complex T12-L1.  Mild spinal stenosis.  Right foraminal stenosis.  At L1-L2 has severe spinal stenosis.  At L2-L3 has broad-based disc bulge and flattening of the ventral thecal sac and severe spinal stenosis.  At L3-L4 has broad-based disc osteophyte complex with a small left paracentral disc protrusion and severe bilateral facet arthropathy.  Severe spinal stenosis.  At L4-L5 has  severe bilateral facet arthropathy and broad-based disc bulge.  Has grade 1 anterior listhesis of L4-on L5 secondary to bilateral facet arthropathy and severe spinal stenosis.  Has moderate bilateral foraminal stenosis.  Similar findings at L5-S1.  Review of Systems see above-no new complaints     Objective:   Physical Exam Vital signs reviewed.  Not complaining of back pain with straight leg raising today.  CT results discussed at length       Assessment & Plan:  Multilevel spinal stenosis T12-S1.  At patient's age I am not sure she is a candidate for surgery which would be extensive.  Have discussed with patient and her daughter returning to see Dr. Ethelene Hal now that the CT scan has been done to see if there are other options.  If there are no other options, we could consider referring her to chronic pain management so that she may have some pain relief and a better quality of life.  Daughter agrees to accompanying her mother back to to see Dr. Ethelene Hal for discussion.  20 minutes spent with patient and her daughter.

## 2019-12-19 ENCOUNTER — Telehealth: Payer: Self-pay | Admitting: Internal Medicine

## 2019-12-19 NOTE — Telephone Encounter (Signed)
Please find referral form for [name] and complete it. They will need her records also. They are not on EPIC

## 2019-12-19 NOTE — Telephone Encounter (Signed)
Pt calling back and said she does want to be referred to one of the Doctors you mentioned yesterday near [place].

## 2019-12-21 ENCOUNTER — Encounter: Payer: Self-pay | Admitting: Internal Medicine

## 2019-12-21 NOTE — Patient Instructions (Signed)
Suggest patient and her daughter return to see Dr. Horald Chestnut for discussion of CT scan and other options for pain relief.  I do not think patient is a candidate for lumbar spinal surgery due to her age.  She may be a candidate for pain management.

## 2019-12-25 NOTE — Telephone Encounter (Signed)
Referral was faxed to [facility] on 12/22/19.

## 2020-01-09 ENCOUNTER — Other Ambulatory Visit: Payer: Self-pay

## 2020-01-09 DIAGNOSIS — F5105 Insomnia due to other mental disorder: Secondary | ICD-10-CM

## 2020-01-09 DIAGNOSIS — F4321 Adjustment disorder with depressed mood: Secondary | ICD-10-CM

## 2020-01-09 MED ORDER — MIRTAZAPINE 15 MG PO TABS: ORAL_TABLET | ORAL | 0 refills | Status: DC

## 2020-02-05 ENCOUNTER — Telehealth: Payer: Self-pay | Admitting: Internal Medicine

## 2020-02-05 NOTE — Telephone Encounter (Signed)
Melissa [name] [phone]  [name] called to say that her mom has decided to go with pain management so she would like that referral.

## 2020-02-05 NOTE — Telephone Encounter (Signed)
Make referral to [name]. You have to send her records and fill out referral form- must send by fax- cannot be done through Epic as they are not on EPIC

## 2020-02-05 NOTE — Telephone Encounter (Signed)
Called and let daughter know that referral to [name] had been placed

## 2020-02-27 ENCOUNTER — Other Ambulatory Visit: Payer: Self-pay

## 2020-02-27 ENCOUNTER — Other Ambulatory Visit: Payer: Medicare Other | Admitting: Internal Medicine

## 2020-02-27 DIAGNOSIS — E782 Mixed hyperlipidemia: Secondary | ICD-10-CM

## 2020-02-27 DIAGNOSIS — Z0000 Encounter for general adult medical examination without abnormal findings: Secondary | ICD-10-CM

## 2020-02-27 DIAGNOSIS — F411 Generalized anxiety disorder: Secondary | ICD-10-CM

## 2020-02-27 DIAGNOSIS — M4807 Spinal stenosis, lumbosacral region: Secondary | ICD-10-CM | POA: Diagnosis not present

## 2020-02-27 DIAGNOSIS — M5416 Radiculopathy, lumbar region: Secondary | ICD-10-CM

## 2020-02-27 DIAGNOSIS — Z1329 Encounter for screening for other suspected endocrine disorder: Secondary | ICD-10-CM

## 2020-02-28 LAB — COMPLETE METABOLIC PANEL WITH GFR
AG Ratio: 1.5 (calc) (ref 1.0–2.5)
ALT: 15 U/L (ref 6–29)
AST: 20 U/L (ref 10–35)
Albumin: 4.4 g/dL (ref 3.6–5.1)
Alkaline phosphatase (APISO): 67 U/L (ref 37–153)
BUN: 16 mg/dL (ref 7–25)
CO2: 27 mmol/L (ref 20–32)
Calcium: 10.2 mg/dL (ref 8.6–10.4)
Chloride: 104 mmol/L (ref 98–110)
Creat: 0.82 mg/dL (ref 0.60–0.88)
GFR, Est African American: 75 mL/min/{1.73_m2} (ref >=60)
GFR, Est Non African American: 65 mL/min/{1.73_m2} (ref >=60)
Globulin: 3 g/dL (calc) (ref 1.9–3.7)
Glucose, Bld: 93 mg/dL (ref 65–99)
Potassium: 4 mmol/L (ref 3.5–5.3)
Sodium: 141 mmol/L (ref 135–146)
Total Bilirubin: 1 mg/dL (ref 0.2–1.2)
Total Protein: 7.4 g/dL (ref 6.1–8.1)

## 2020-02-28 LAB — CBC WITH DIFFERENTIAL/PLATELET
Absolute Monocytes: 407 cells/uL (ref 200–950)
Basophils Absolute: 78 cells/uL (ref 0–200)
Basophils Relative: 2.1 %
Eosinophils Absolute: 218 cells/uL (ref 15–500)
Eosinophils Relative: 5.9 %
HCT: 40.2 % (ref 35.0–45.0)
Hemoglobin: 12.9 g/dL (ref 11.7–15.5)
Lymphs Abs: 1306 cells/uL (ref 850–3900)
MCH: 26.7 pg — ABNORMAL LOW (ref 27.0–33.0)
MCHC: 32.1 g/dL (ref 32.0–36.0)
MCV: 83.1 fL (ref 80.0–100.0)
MPV: 11.9 fL (ref 7.5–12.5)
Monocytes Relative: 11 %
Neutro Abs: 1691 cells/uL (ref 1500–7800)
Neutrophils Relative %: 45.7 %
Platelets: 218 10*3/uL (ref 140–400)
RBC: 4.84 10*6/uL (ref 3.80–5.10)
RDW: 13.6 % (ref 11.0–15.0)
Total Lymphocyte: 35.3 %
WBC: 3.7 10*3/uL — ABNORMAL LOW (ref 3.8–10.8)

## 2020-02-28 LAB — LIPID PANEL
Cholesterol: 189 mg/dL (ref <200)
HDL: 80 mg/dL (ref >=50)
LDL Cholesterol (Calc): 92 mg/dL (calc)
Non-HDL Cholesterol (Calc): 109 mg/dL (calc) (ref <130)
Total CHOL/HDL Ratio: 2.4 (calc) (ref <5.0)
Triglycerides: 79 mg/dL (ref <150)

## 2020-02-28 LAB — TSH: TSH: 1.61 mIU/L (ref 0.40–4.50)

## 2020-02-29 ENCOUNTER — Other Ambulatory Visit: Payer: Self-pay

## 2020-02-29 ENCOUNTER — Encounter: Payer: Self-pay | Admitting: Internal Medicine

## 2020-02-29 ENCOUNTER — Ambulatory Visit (INDEPENDENT_AMBULATORY_CARE_PROVIDER_SITE_OTHER): Payer: Medicare Other | Admitting: Internal Medicine

## 2020-02-29 VITALS — BP 110/80 | HR 84 | Ht <= 58 in | Wt 136.0 lb

## 2020-02-29 DIAGNOSIS — Z8659 Personal history of other mental and behavioral disorders: Secondary | ICD-10-CM

## 2020-02-29 DIAGNOSIS — I10 Essential (primary) hypertension: Secondary | ICD-10-CM

## 2020-02-29 DIAGNOSIS — Z0000 Encounter for general adult medical examination without abnormal findings: Secondary | ICD-10-CM

## 2020-02-29 DIAGNOSIS — M5416 Radiculopathy, lumbar region: Secondary | ICD-10-CM

## 2020-02-29 DIAGNOSIS — M4807 Spinal stenosis, lumbosacral region: Secondary | ICD-10-CM

## 2020-02-29 DIAGNOSIS — E782 Mixed hyperlipidemia: Secondary | ICD-10-CM

## 2020-02-29 DIAGNOSIS — F411 Generalized anxiety disorder: Secondary | ICD-10-CM

## 2020-02-29 LAB — POCT URINALYSIS DIPSTICK
Appearance: NEGATIVE
Bilirubin, UA: NEGATIVE
Blood, UA: NEGATIVE
Glucose, UA: NEGATIVE
Ketones, UA: NEGATIVE
Leukocytes, UA: NEGATIVE
Nitrite, UA: NEGATIVE
Odor: NEGATIVE
Protein, UA: NEGATIVE
Spec Grav, UA: 1.01 (ref 1.010–1.025)
Urobilinogen, UA: 0.2 E.U./dL
pH, UA: 6.5 (ref 5.0–8.0)

## 2020-02-29 NOTE — Progress Notes (Signed)
Subjective:    Patient ID: Melissa [name], female    DOB: 03/17/1931, 86 y.o.   MRN: [id number]  HPI 86 year old Female for [facility], health maintenance exam, and evaluation of medical issues.  Still drives locally some. Hx anxiety. Hx chronic back pain.Has lumbar radiculopathy and multilevel spinal stenosis and lumbar region.  Reviewed MRI report of the LS spine in 2016.  She saw Dr. Ethelene Hal and tried epidural steroids. We made referral to [name] in June but patient has not heard back.Has tried numerous types of pain meds OTC which sometime helps temporarily. Back hurts when walking or standing.  She has a history of anxiety.  Driving makes her anxious.  She drives around certain parts of [place] with lesser traffic.  She has a history of bladder dysfunction with urinary frequency.  She had a sacral nerve stimulator placed in [place] a number of years ago.  She had the device turned off she says because it was causing some odd vibrations in her vagina did not really help her bladder problem.  It is still inflated.  History of benign right breast biopsy in the remote past.  Current issues are hypertension, anxiety and hyperlipidemia.  Intolerant of aspirin-causes dizziness and nausea.  Codeine causes dizziness and nausea.  Tramadol causes dizziness and weakness.  Social history: Husband died at [facility] with malignancy.  They were married about 60 years.  He was  Economist of [employer].  They moved to [place] not too long before he passed away.  She has a daughter who lives in [place] and now she resides with her daughter, son-in-law and their family.  She has 1 son who lives in [place].  She did have some  counseling with Mathis Fare, social worker in 2020 for anxiety and grief.  She tested positive for COVID-19 on August 12, 2019.  Test was done at [facility].  She did not have serious illness.  She has had 2 COVID-19  immunizations in February and March of this year.  Has had 2 pneumococcal vaccines.  Gets annual flu vaccine.  I think she could benefit from chronic pain management.  I think it would be difficult for her to undergo back surgery at her age.  She is agreeable to seeking consultation with physicians at [facility].   Review of Systems main complaint is back pain     Objective:   Physical Exam Blood pressure 110/80 pulse 84 regular pulse oximetry 96% weight 136 pounds height 4 feet 10 inches BMI 28.42  Skin warm and dry.  No cervical adenopathy.  TMs are clear.  Neck is supple without JVD thyromegaly or carotid bruits.  Chest is clear to auscultation without rales or wheezing.  Breast without masses.  Cardiac exam regular rate and rhythm normal S1 and S2 without murmurs or gallops.  Abdomen soft nondistended without hepatosplenomegaly masses or tenderness.  Bimanual exam is normal.  Pap deferred due to age.  No gross focal deficits on brief neurological exam.  Affect thought and judgment are within normal limits.  She is slightly anxious.       Assessment & Plan:  Multilevel spinal stenosis-could benefit from chronic pain management.  Did not get much relief from epidural steroid injections.  Anxiety disorder-treated with Xanax  History of benign right breast biopsy  History of COVID-19 infection in January of this year which was mild.  Has subsequently had 2 COVID-19 immunizations.  History of bladder  dysfunction  History of mixed hyperlipidemia treated with statin  History of depression and grief treated with Zoloft.  Currently taking Remeron 15 mg 1/2 to 1 tablet at bedtime per nurse practitioner  Plan: She has Xanax to take twice daily for anxiety.  We will see her again in 6 months.  Referral has been made to [facility] pain management.  Subjective:   Patient presents for Medicare Annual/Subsequent preventive examination.  Review Past Medical/Family/Social: See  above   Risk Factors  Current exercise habits: Mostly sedentary Dietary issues discussed: Low-fat low carbohydrate  Cardiac risk factors: Hyperlipidemia  Depression Screen  (Note: if answer to either of the following is "Yes", a more complete depression screening is indicated)   Over the past two weeks, have you felt down, depressed or hopeless?  Yes due to back pain Over the past two weeks, have you felt little interest or pleasure in doing things?  Yes due to back pain Have you lost interest or pleasure in daily life?  Sometimes due to back pain Do you often feel hopeless?  Sometimes due to back pain Do you cry easily over simple problems? No   Activities of Daily Living  In your present state of health, do you have any difficulty performing the following activities?:   Driving? No  Managing money? No  Feeding yourself? No  Getting from bed to chair? No  Climbing a flight of stairs?  Yes due to back pain Preparing food and eating?: No  Bathing or showering? No  Getting dressed: No  Getting to the toilet? No  Using the toilet:No  Moving around from place to place: No  In the past year have you fallen or had a near fall?:No  Are you sexually active? No  Do you have more than one partner? No   Hearing Difficulties: No  Do you often ask people to speak up or repeat themselves? No  Do you experience ringing or noises in your ears? No  Do you have difficulty understanding soft or whispered voices? No  Do you feel that you have a problem with memory?  Yes occasionally Do you often misplace items?  Yes   Home Safety:  Do you have a smoke alarm at your residence? Yes Do you have grab bars in the bathroom?  No Do you have throw rugs in your house?  No   Cognitive Testing  Alert? Yes Normal Appearance?Yes  Oriented to person? Yes Place? Yes  Time? Yes  Recall of three objects? Yes  Can perform simple calculations? Yes  Displays appropriate judgment?Yes  Can read the  correct time from a watch face?Yes   List the Names of Other Physician/Practitioners you currently use:  See referral list for the physicians patient is currently seeing.  Has seen Dr. Ethelene Hal for back pain   Review of Systems: See above   Objective:     General appearance: Appears younger than stated age and slightly frail Head: Normocephalic, without obvious abnormality, atraumatic  Eyes: conj clear, EOMi PEERLA  Ears: normal TM's and external ear canals both ears  Nose: Nares normal. Septum midline. Mucosa normal. No drainage or sinus tenderness.  Throat: lips, mucosa, and tongue normal; teeth and gums normal  Neck: no adenopathy, no carotid bruit, no JVD, supple, symmetrical, trachea midline and thyroid not enlarged, symmetric, no tenderness/mass/nodules  No CVA tenderness.  Lungs: clear to auscultation bilaterally  Breasts: normal appearance, no masses or tenderness Heart: regular rate and rhythm, S1, S2 normal, no murmur,  click, rub or gallop  Abdomen: soft, non-tender; bowel sounds normal; no masses, no organomegaly  Musculoskeletal: ROM normal in all joints, no crepitus, no deformity, Normal muscle strengthen. Back  is symmetric, no curvature. Skin: Skin color, texture, turgor normal. No rashes or lesions  Lymph nodes: Cervical, supraclavicular, and axillary nodes normal.  Neurologic: CN 2 -12 Normal, Normal symmetric reflexes. Normal coordination and gait  Psych: Alert & Oriented x 3, Mood appear stable.    Assessment:    Annual wellness medicare exam   Plan:    During the course of the visit the patient was educated and counseled about appropriate screening and preventive services including:   Has had 2 COVID-19 vaccines  We will give flu vaccine in the Fall     Patient Instructions (the written plan) was given to the patient.  Medicare Attestation  I have personally reviewed:  The patient's medical and social history  Their use of alcohol, tobacco or  illicit drugs  Their current medications and supplements  The patient's functional ability including ADLs,fall risks, home safety risks, cognitive, and hearing and visual impairment  Diet and physical activities  Evidence for depression or mood disorders  The patient's weight, height, BMI, and visual acuity have been recorded in the chart. I have made referrals, counseling, and provided education to the patient based on review of the above and I have provided the patient with a written personalized care plan for preventive services.

## 2020-03-22 DIAGNOSIS — Z1231 Encounter for screening mammogram for malignant neoplasm of breast: Secondary | ICD-10-CM | POA: Diagnosis not present

## 2020-03-22 LAB — HM MAMMOGRAPHY

## 2020-03-25 ENCOUNTER — Encounter: Payer: Self-pay | Admitting: Internal Medicine

## 2020-03-28 DIAGNOSIS — Z0000 Encounter for general adult medical examination without abnormal findings: Secondary | ICD-10-CM | POA: Diagnosis not present

## 2020-03-30 NOTE — Patient Instructions (Signed)
It was a pleasure to see you today.  Continue current medications and follow-up in 6 months.  We will Reach Out to [facility] regarding your referral.

## 2020-04-01 DIAGNOSIS — G5601 Carpal tunnel syndrome, right upper limb: Secondary | ICD-10-CM | POA: Diagnosis not present

## 2020-04-01 DIAGNOSIS — M48062 Spinal stenosis, lumbar region with neurogenic claudication: Secondary | ICD-10-CM | POA: Diagnosis not present

## 2020-04-01 DIAGNOSIS — Z79891 Long term (current) use of opiate analgesic: Secondary | ICD-10-CM | POA: Diagnosis not present

## 2020-04-01 DIAGNOSIS — G894 Chronic pain syndrome: Secondary | ICD-10-CM | POA: Diagnosis not present

## 2020-04-01 DIAGNOSIS — S8412XS Injury of peroneal nerve at lower leg level, left leg, sequela: Secondary | ICD-10-CM | POA: Diagnosis not present

## 2020-04-01 DIAGNOSIS — M150 Primary generalized (osteo)arthritis: Secondary | ICD-10-CM | POA: Diagnosis not present

## 2020-05-03 ENCOUNTER — Other Ambulatory Visit: Payer: Self-pay | Admitting: Internal Medicine

## 2020-05-16 DIAGNOSIS — M150 Primary generalized (osteo)arthritis: Secondary | ICD-10-CM | POA: Diagnosis not present

## 2020-05-16 DIAGNOSIS — M48062 Spinal stenosis, lumbar region with neurogenic claudication: Secondary | ICD-10-CM | POA: Diagnosis not present

## 2020-05-16 DIAGNOSIS — S8412XS Injury of peroneal nerve at lower leg level, left leg, sequela: Secondary | ICD-10-CM | POA: Diagnosis not present

## 2020-05-16 DIAGNOSIS — G5601 Carpal tunnel syndrome, right upper limb: Secondary | ICD-10-CM | POA: Diagnosis not present

## 2020-05-17 DIAGNOSIS — Z23 Encounter for immunization: Secondary | ICD-10-CM | POA: Diagnosis not present

## 2020-06-17 DIAGNOSIS — S8412XS Injury of peroneal nerve at lower leg level, left leg, sequela: Secondary | ICD-10-CM | POA: Diagnosis not present

## 2020-06-17 DIAGNOSIS — M48062 Spinal stenosis, lumbar region with neurogenic claudication: Secondary | ICD-10-CM | POA: Diagnosis not present

## 2020-06-17 DIAGNOSIS — G894 Chronic pain syndrome: Secondary | ICD-10-CM | POA: Diagnosis not present

## 2020-06-17 DIAGNOSIS — M150 Primary generalized (osteo)arthritis: Secondary | ICD-10-CM | POA: Diagnosis not present

## 2020-08-19 DIAGNOSIS — S8412XS Injury of peroneal nerve at lower leg level, left leg, sequela: Secondary | ICD-10-CM | POA: Diagnosis not present

## 2020-08-19 DIAGNOSIS — G894 Chronic pain syndrome: Secondary | ICD-10-CM | POA: Diagnosis not present

## 2020-08-19 DIAGNOSIS — M150 Primary generalized (osteo)arthritis: Secondary | ICD-10-CM | POA: Diagnosis not present

## 2020-08-19 DIAGNOSIS — M48062 Spinal stenosis, lumbar region with neurogenic claudication: Secondary | ICD-10-CM | POA: Diagnosis not present

## 2020-09-03 ENCOUNTER — Other Ambulatory Visit: Payer: Medicare Other | Admitting: Internal Medicine

## 2020-09-03 ENCOUNTER — Other Ambulatory Visit: Payer: Self-pay

## 2020-09-03 DIAGNOSIS — E782 Mixed hyperlipidemia: Secondary | ICD-10-CM

## 2020-09-04 LAB — LIPID PANEL
Cholesterol: 171 mg/dL (ref <200)
HDL: 74 mg/dL (ref >=50)
LDL Cholesterol (Calc): 78 mg/dL (calc)
Non-HDL Cholesterol (Calc): 97 mg/dL (calc) (ref <130)
Total CHOL/HDL Ratio: 2.3 (calc) (ref <5.0)
Triglycerides: 102 mg/dL (ref <150)

## 2020-09-05 ENCOUNTER — Ambulatory Visit (INDEPENDENT_AMBULATORY_CARE_PROVIDER_SITE_OTHER): Payer: Medicare Other | Admitting: Internal Medicine

## 2020-09-05 ENCOUNTER — Encounter: Payer: Self-pay | Admitting: Internal Medicine

## 2020-09-05 ENCOUNTER — Other Ambulatory Visit: Payer: Self-pay

## 2020-09-05 VITALS — BP 130/80 | HR 84 | Ht <= 58 in | Wt 144.0 lb

## 2020-09-05 DIAGNOSIS — M5416 Radiculopathy, lumbar region: Secondary | ICD-10-CM

## 2020-09-05 DIAGNOSIS — I10 Essential (primary) hypertension: Secondary | ICD-10-CM

## 2020-09-05 DIAGNOSIS — F4321 Adjustment disorder with depressed mood: Secondary | ICD-10-CM

## 2020-09-05 DIAGNOSIS — F5102 Adjustment insomnia: Secondary | ICD-10-CM

## 2020-09-05 DIAGNOSIS — M549 Dorsalgia, unspecified: Secondary | ICD-10-CM

## 2020-09-05 DIAGNOSIS — G8929 Other chronic pain: Secondary | ICD-10-CM

## 2020-09-05 DIAGNOSIS — E782 Mixed hyperlipidemia: Secondary | ICD-10-CM | POA: Diagnosis not present

## 2020-09-05 DIAGNOSIS — F32A Depression, unspecified: Secondary | ICD-10-CM

## 2020-09-05 DIAGNOSIS — F411 Generalized anxiety disorder: Secondary | ICD-10-CM | POA: Diagnosis not present

## 2020-09-05 DIAGNOSIS — F419 Anxiety disorder, unspecified: Secondary | ICD-10-CM

## 2020-09-05 DIAGNOSIS — F5105 Insomnia due to other mental disorder: Secondary | ICD-10-CM

## 2020-09-05 DIAGNOSIS — M4807 Spinal stenosis, lumbosacral region: Secondary | ICD-10-CM | POA: Diagnosis not present

## 2020-09-05 MED ORDER — HYDROCODONE-ACETAMINOPHEN 5-325 MG PO TABS: ORAL_TABLET | ORAL | 0 refills | Status: DC

## 2020-09-05 MED ORDER — MIRTAZAPINE 15 MG PO TABS: ORAL_TABLET | ORAL | 0 refills | Status: DC

## 2020-09-05 NOTE — Progress Notes (Signed)
Subjective:    Patient ID: Melissa [name], female    DOB: 29-Jul-1931, 87 y.o.   MRN: [id number]  HPI 87 year old Female seen with her daughter for 73-monthfollow-up.  History of severe lumbar spinal stenosis.  She saw Dr. MNicholaus Bloomregarding pain management in October.  Was diagnosed with lumbar neurogenic claudication, osteoarthritis and right carpal tunnel syndrome.  We will request those records.  They are not in epic.  Not sure why she was not started on narcotic pain management medication.  Daughter does not seem to know the answer to this as well and this is a bit frustrating today.  She is not a candidate for surgical intervention at her age.  Has seen Dr. RHerma Meringfor epidural steroids which had minimal relief.  Was seen here in July for Medicare wellness visit and health maintenance exam.  Tried over-the-counter pain medications which only helped temporarily.  Back hurts when walking or standing.  Has history of anxiety.  Does not sleep well due to anxiety.  [place] device turned off because she said it caused some odd vibrations in her vagina did not help her problem.  It was really placed for bladder dysfunction rather than pain management.  It is still inflated.  Has hypertension, hyperlipidemia.  History of anxiety which is longstanding.  She is treated with statin medication.  For depression and grief she is treated with Zoloft.  [facility] DShanon Ace LCSW.  Also saw nurse practitioner there  Remeron was prescribed.  This will be continued.  She has not been back there in some time.  Social history: She is a widow and resides with her daughter.  Her lipid panel drawn on January 25 is entirely within normal limits.  CBC c-Met TSH were checked in July and were within normal limits.  Review of Systems continues to be  anxious and complains bitterly about back pain.  Today complaining of some chest discomfort.  EKG obtained showing no acute changes.     Objective:   Physical Exam Blood pressure 130/80 pulse 84 pulse oximetry 95% weight 144 pounds BMI 30.10  Skin warm and dry.  No cervical adenopathy.  No carotid bruits.  No thyromegaly.  Chest clear to auscultation.  Cardiac exam regular rate and rhythm.  No lower extremity pitting edema.  Her affect is anxious.       Assessment & Plan:  Anxiety state-longstanding  Essential hypertension-stable on current regimen  Chronic lumbar back pain due to multilevel spinal stenosis.  Spinal cord stimulator is turned off.  She needs to be on some type of chronic pain management.  We will request records from [name].  Do not see these in Epic.  PMP aware checked.  I am starting her on Norco 5/325 twice daily for chronic back pain.  She will continue with Remeron for anxiety  Currently taking Xanax twice daily for anxiety.  Continue valsartan HCTZ for hypertension and Zoloft for depression.  Follow-up in 4 weeks.  Will need drug screen at that time as well as follow-up on chronic pain issues.

## 2020-09-07 NOTE — Patient Instructions (Addendum)
EKG shows no acute changes.  We will make cardiology referral.  We have started her on Norco 5/325 twice daily for chronic back pain with follow-up in 4 weeks.  I need to obtain records from [facility] pain management.Marland Kitchen  Her blood pressure is stable on current regimen.  [facility]  Continue Xanax for anxiety.

## 2020-09-09 ENCOUNTER — Encounter: Payer: Self-pay | Admitting: Internal Medicine

## 2020-09-19 ENCOUNTER — Other Ambulatory Visit: Payer: Self-pay | Admitting: Internal Medicine

## 2020-09-23 ENCOUNTER — Encounter: Payer: Self-pay | Admitting: Internal Medicine

## 2020-09-23 ENCOUNTER — Ambulatory Visit (INDEPENDENT_AMBULATORY_CARE_PROVIDER_SITE_OTHER): Payer: Medicare Other | Admitting: Internal Medicine

## 2020-09-23 ENCOUNTER — Other Ambulatory Visit: Payer: Self-pay

## 2020-09-23 VITALS — BP 102/60 | HR 96 | Ht <= 58 in | Wt 144.6 lb

## 2020-09-23 DIAGNOSIS — R0609 Other forms of dyspnea: Secondary | ICD-10-CM

## 2020-09-23 DIAGNOSIS — R0600 Dyspnea, unspecified: Secondary | ICD-10-CM | POA: Diagnosis not present

## 2020-09-23 DIAGNOSIS — R079 Chest pain, unspecified: Secondary | ICD-10-CM

## 2020-09-23 DIAGNOSIS — I739 Peripheral vascular disease, unspecified: Secondary | ICD-10-CM | POA: Diagnosis not present

## 2020-09-23 DIAGNOSIS — E785 Hyperlipidemia, unspecified: Secondary | ICD-10-CM

## 2020-09-23 DIAGNOSIS — I10 Essential (primary) hypertension: Secondary | ICD-10-CM | POA: Diagnosis not present

## 2020-09-23 NOTE — Patient Instructions (Signed)
Medication Instructions:  Your physician recommends that you continue on your current medications as directed. Please refer to the Current Medication list given to you today.  *If you need a refill on your cardiac medications before your next appointment, please call your pharmacy*   Lab Work: NONE If you have labs (blood work) drawn today and your tests are completely normal, you will receive your results only by: Marland Kitchen MyChart Message (if you have MyChart) OR . A paper copy in the mail If you have any lab test that is abnormal or we need to change your treatment, we will call you to review the results.   Testing/Procedures: Your physician has requested that you have an echocardiogram. Echocardiography is a painless test that uses sound waves to create images of your heart. It provides your doctor with information about the size and shape of your heart and how well your heart's chambers and valves are working. This procedure takes approximately one hour. There are no restrictions for this procedure.  Your physician has requested that you have an ankle brachial index (ABI).  During this test an ultrasound and blood pressure cuff are used to evaluate the arteries that supply the arms and legs with blood. Allow thirty minutes for this exam. There are no restrictions or special instructions.  This test will be performed at [address] , [place], [place], [address].       Follow-Up: At [facility], you and your health needs are our priority.  As part of our continuing mission to provide you with exceptional heart care, we have created designated Provider Care Teams.  These Care Teams include your primary Cardiologist (physician) and Advanced Practice Providers (APPs -  Physician Assistants and Nurse Practitioners) who all work together to provide you with the care you need, when you need it.   Your next appointment:   4 month(s)  The format for your next appointment:   In  Person  Provider:   You may see Izora Ribas, MD or one of the following Advanced Practice Providers on your designated Care Team:    Ronie Spies, PA-C  Jacolyn Reedy, PA-C

## 2020-09-23 NOTE — Progress Notes (Signed)
Cardiology Office Note:    Date:  09/23/2020   ID:  Melissa [name], DOB [birth year], MRN [id number]  PCP:  Margaree Mackintosh, MD   [facility]  Cardiologist:  No primary care provider on file.  Advanced Practice Provider:  No care team member to display Electrophysiologist:  None       CC: Chest pain Consulted for the evaluation of chest pain at the behest of Baxley, Luanna Cole, MD  History of Present Illness:    Melissa [name] is a 87 y.o. female with a hx of HTN, HLD, Spinal stenosis who presents for evaluation 09/23/20.  Patient notes that he is feeling persistent chest pain.  Patient had chest pain with with palpitation.  In October got her COVID-19 Booster.  Ever since this has had sternal chest pain.  No radiation of pain.  Chest pain is a soreness with palpation.  Discomfort occurs with palpitations, worsens without activity, and improves with rest.  Patient exertion notable for goin gup and down stairs with and feels no symptoms.  No shortness of breath, DOE (but notes that most of this is related to her back pain).  No PND or orthopnea, but daughter notes she sleeps in a ciar.  No leg swelling  or abdominal swelling. But notes some ankle swellings.  Notes bilateral leg claudication that is worsen with activity, and improves with rest. No leg discoloration.  No rest pain.  No syncope or near syncope . Notes no palpitations or funny heart beats.     Patient reports NO prior cardiac testing including  echo,  stress test,  heart catheterizations,  cardioversion,  ablations.  No history of pre-eclampsia.    Ambulatory BP not done.   Past Medical History:  Diagnosis Date  . Allergy   . Anxiety   . Bladder dysfunction   . Bulging lumbar disc   . Depression   . Hyperlipidemia   . Hypertension   . Low back pain   . Lumbar radiculopathy   . Spinal stenosis     Past Surgical History:  Procedure Laterality Date  . bladder dysfunction    . BREAST CYST  EXCISION    . CATARACT EXTRACTION      Current Medications: Current Meds  Medication Sig  . ALPRAZolam (XANAX) 0.5 MG tablet TAKE 1/2 TO 1 TABLET BY MOUTH EVERY MORNING AND 1/2 TO 1 TABLET AT NIGHT  . atorvastatin (LIPITOR) 40 MG tablet TAKE 1 TABLET BY MOUTH EVERY DAY  . cholecalciferol (VITAMIN D) 1000 units tablet Take 1,000 Units by mouth daily.  Marland Kitchen HYDROcodone-acetaminophen (NORCO) 5-325 MG tablet One tab twice a day with food for chronic back pain.  . mirtazapine (REMERON) 15 MG tablet Take 15 mg by mouth at bedtime.  . sertraline (ZOLOFT) 100 MG tablet TAKE 1 TABLET(100 MG) BY MOUTH DAILY  . TURMERIC PO Take by mouth.  . valsartan-hydrochlorothiazide (DIOVAN-HCT) 80-12.5 MG tablet TAKE 1 TABLET BY MOUTH EVERY DAY     Allergies:   Aspirin, Aspirin, Codeine, and Tramadol   Social History   Socioeconomic History  . Marital status: Widowed    Spouse name: Not on file  . Number of children: Not on file  . Years of education: Not on file  . Highest education level: Not on file  Occupational History  . Not on file  Tobacco Use  . Smoking status: Never Smoker  . Smokeless tobacco: Never Used  Substance and Sexual Activity  . Alcohol use: Yes  Comment: Reports 1/2 glass of wine periodically  . Drug use: Never  . Sexual activity: Not on file  Other Topics Concern  . Not on file  Social History Narrative   ** Merged History Encounter **       Social Determinants of Health   Financial Resource Strain: Not on file  Food Insecurity: Not on file  Transportation Needs: Not on file  Physical Activity: Not on file  Stress: Not on file  Social Connections: Not on file     Family History: The patient's family history includes Alcohol abuse in her father; Anxiety disorder in her granddaughter, niece, and sister; Arthritis in her mother.  History of coronary artery disease notable for no members. History of heart failure notable for no members. History of arrhythmia notable  for no members.  ROS:   Please see the history of present illness.     All other systems reviewed and are negative.  EKGs/Labs/Other Studies Reviewed:    The following studies were reviewed today:  EKG:  09/23/20: SR rate 97, Nonspecific TWI 09/09/20: SR rate 78 with non-specific TWI  Recent Labs: 02/27/2020: ALT 15; BUN 16; Creat 0.82; Hemoglobin 12.9; Platelets 218; Potassium 4.0; Sodium 141; TSH 1.61  Recent Lipid Panel    Component Value Date/Time   CHOL 171 09/03/2020 0931   TRIG 102 09/03/2020 0931   HDL 74 09/03/2020 0931   CHOLHDL 2.3 09/03/2020 0931   LDLCALC 78 09/03/2020 0931     Risk Assessment/Calculations:     N/A  Physical Exam:    VS:  BP 102/60   Pulse 96   Ht 4\' 10"  (1.473 m)   Wt 144 lb 9.6 oz (65.6 kg)   SpO2 93%   BMI 30.22 kg/m     Wt Readings from Last 3 Encounters:  09/23/20 144 lb 9.6 oz (65.6 kg)  09/05/20 144 lb (65.3 kg)  02/29/20 136 lb (61.7 kg)    GEN: Elderly female well developed in no acute distress HEENT: Normal NECK: No JVD; No carotid bruits LYMPHATICS: No lymphadenopathy CARDIAC: RRR, II/VI systolic crescendo ,no rubs, gallops; +1 radial bilaterally RESPIRATORY:  Clear to auscultation without rales, wheezing or rhonchi  ABDOMEN: Soft, non-tender, non-distended MUSCULOSKELETAL:  No edema; No deformity  SKIN: Warm and dry NEUROLOGIC:  Alert and oriented x 3 PSYCHIATRIC:  Normal affect   ASSESSMENT:    1. DOE (dyspnea on exertion)   2. Claudication of both lower extremities (HCC)   3. Chest pain of uncertain etiology   4. Essential hypertension   5. Hyperlipidemia, unspecified hyperlipidemia type    PLAN:    In order of problems listed above:  Noncardiac Chest Pain DOE - Chest pain occurs only with palpation and Is noncardiac in nature - DOE with heart murmur appreciated; would do echocardiogram  Leg Claudication - Would get ABIs and Duplex to evaluation for PAD; may be he spinal stenosis but she does have  risk factors.  Essential Hypertension - ambulatory blood pressure not done, will start ambulatory BP monitoring; gave education on how to perform ambulatory blood pressure monitoring including the frequency and technique; goal ambulatory blood pressure < 135/85 on average - continue home medications - discussed diet (DASH/low sodium), and exercise/weight loss interventions   Hyperlipidemia  -LDL goal less than 100 -continue current statin - gave education on dietary changes  Three- four month follow up unless new symptoms or abnormal test results warranting change in plan  Would be reasonable for  Video Visit Follow  up Would be reasonable for  APP Follow up        Medication Adjustments/Labs and Tests Ordered: Current medicines are reviewed at length with the patient today.  Concerns regarding medicines are outlined above.  Orders Placed This Encounter  Procedures  . EKG 12-Lead  . ECHOCARDIOGRAM COMPLETE  . VAS [place] ABI WITH/WO TBI  . VAS [place] LOWER EXTREMITY ARTERIAL DUPLEX   No orders of the defined types were placed in this encounter.   Patient Instructions  Medication Instructions:  Your physician recommends that you continue on your current medications as directed. Please refer to the Current Medication list given to you today.  *If you need a refill on your cardiac medications before your next appointment, please call your pharmacy*   Lab Work: NONE If you have labs (blood work) drawn today and your tests are completely normal, you will receive your results only by: Marland Kitchen MyChart Message (if you have MyChart) OR . A paper copy in the mail If you have any lab test that is abnormal or we need to change your treatment, we will call you to review the results.   Testing/Procedures: Your physician has requested that you have an echocardiogram. Echocardiography is a painless test that uses sound waves to create images of your heart. It provides your doctor with information  about the size and shape of your heart and how well your heart's chambers and valves are working. This procedure takes approximately one hour. There are no restrictions for this procedure.  Your physician has requested that you have an ankle brachial index (ABI).  During this test an ultrasound and blood pressure cuff are used to evaluate the arteries that supply the arms and legs with blood. Allow thirty minutes for this exam. There are no restrictions or special instructions.  This test will be performed at [address] , [place], [place], [address].       Follow-Up: At [facility], you and your health needs are our priority.  As part of our continuing mission to provide you with exceptional heart care, we have created designated Provider Care Teams.  These Care Teams include your primary Cardiologist (physician) and Advanced Practice Providers (APPs -  Physician Assistants and Nurse Practitioners) who all work together to provide you with the care you need, when you need it.   Your next appointment:   4 month(s)  The format for your next appointment:   In Person  Provider:   You may see Izora Ribas, MD or one of the following Advanced Practice Providers on your designated Care Team:    Ronie Spies, PA-C  Jacolyn Reedy, PA-C         Signed, Christell Constant, MD  09/23/2020 3:54 PM     [facility]

## 2020-10-03 ENCOUNTER — Ambulatory Visit (INDEPENDENT_AMBULATORY_CARE_PROVIDER_SITE_OTHER): Payer: Medicare Other | Admitting: Internal Medicine

## 2020-10-03 ENCOUNTER — Other Ambulatory Visit: Payer: Self-pay

## 2020-10-03 ENCOUNTER — Encounter: Payer: Self-pay | Admitting: Internal Medicine

## 2020-10-03 VITALS — BP 110/70 | HR 95 | Ht <= 58 in | Wt 145.0 lb

## 2020-10-03 DIAGNOSIS — R413 Other amnesia: Secondary | ICD-10-CM

## 2020-10-03 DIAGNOSIS — G8929 Other chronic pain: Secondary | ICD-10-CM | POA: Diagnosis not present

## 2020-10-03 DIAGNOSIS — F411 Generalized anxiety disorder: Secondary | ICD-10-CM | POA: Diagnosis not present

## 2020-10-03 DIAGNOSIS — M4807 Spinal stenosis, lumbosacral region: Secondary | ICD-10-CM

## 2020-10-03 DIAGNOSIS — M549 Dorsalgia, unspecified: Secondary | ICD-10-CM | POA: Diagnosis not present

## 2020-10-03 DIAGNOSIS — I10 Essential (primary) hypertension: Secondary | ICD-10-CM | POA: Diagnosis not present

## 2020-10-03 DIAGNOSIS — E782 Mixed hyperlipidemia: Secondary | ICD-10-CM | POA: Diagnosis not present

## 2020-10-03 NOTE — Patient Instructions (Signed)
Urine drug screen obtained on hydrocodone APAP.  It does not seem that the hydrocodone APAP is making much difference with her chronic back pain but she seems in no acute distress today and is very talkative.  Daughter raises some issues about memory loss and we will refer her to neurology for evaluation.  I will see her again in 3 months for follow-up on back pain.  She is not a candidate for back surgery due to her age.

## 2020-10-03 NOTE — Progress Notes (Signed)
   Subjective:    Patient ID: Melissa [name], female    DOB: Feb 27, 1931, 87 y.o.   MRN: [id number]  HPI 87 year old Female seen for follow up on chronic back pain for lumbar stenosis. May have some memory issues. Discussed patient's issues with her daughter today and will refer for memory testing. Cardiologist has scheduled her for ABIs and echocardiogram. No complaint of chest pain today. Her bedroom is upstairs. Has some stairs to climb which at times is difficult due to back issues.  At last visit she was placed on Norco 5/325 twice daily for chronic back pain.  She says it is not helping.  However she is sitting in chair today in no acute distress and is actively participating in conversation today without distress.  She has a history of anxiety and depression which worsened after her husband died and is on Remeron and Zoloft.  History of hypertension treated valsartan HCTZ.  She has been seen recently by Cardiology at my request with complaint of dyspnea on exertion.  Also noted nonspecific T wave changes in leads III and aVF with complaint of chest pain when seen here in January.  Cardiologist plans to do echocardiogram.    Review of Systems see above-no new complaints.  She does have stairs to climb in home she shares with daughter husband and grandchildren.  Sometimes is difficult for her to get up the stairs due to back and knee issues.     Objective:   Physical Exam Briefly tested her memory today and it appears to be pretty good but daughter has noticed some decline recently.  Skin warm and dry.  No carotid bruits.  Chest clear.  Cardiac exam regular rate and rhythm.  Quite talkative today.  Appears not to be in any acute distress due to back pain.  Blood pressure 110/70 pulse 95 regular pulse oximetry 98% weight 145 pounds     Assessment & Plan:  Chronic back pain-currently on Norco 5/325 twice daily patient says is not helping.  However she does not appear to be in any  acute distress today and I think is ambulating better.  We did a urine drug screen today as she is in pain management.  Follow-up in 3 months.  Anxiety depression-has seen counselor in the past.  Currently on Zoloft and Remeron.  Her daughter is noticed some memory issues and would like for her to have her memory checked.  This will be arranged.  Hypertension treated with valsartan HCTZ.  Blood pressure is under excellent control.  Hyperlipidemia treated with Lipitor  Possible memory loss-daughter is a bit concerned.  Will refer to Neurology for evaluation.  Explained to daughter not sure there is much we can do for her memory loss at her age.  She could be tried on Aricept.  She is on Zoloft for anxiety and depression take Xanax up to twice daily for anxiety.  She is also on Remeron for anxiety and sleep.  Shortness of breath-saw Cardiologist February 14 for dyspnea on exertion.  Was felt to have noncardiac chest pain.  Has some dyspnea on exertion at the time and heart murmur was noted.  Rule out arterial sources for leg pain such as claudication.

## 2020-10-09 ENCOUNTER — Other Ambulatory Visit: Payer: Self-pay | Admitting: Internal Medicine

## 2020-10-09 DIAGNOSIS — F411 Generalized anxiety disorder: Secondary | ICD-10-CM

## 2020-10-09 DIAGNOSIS — F4321 Adjustment disorder with depressed mood: Secondary | ICD-10-CM

## 2020-10-10 ENCOUNTER — Other Ambulatory Visit: Payer: Self-pay

## 2020-10-10 MED ORDER — HYDROCODONE-ACETAMINOPHEN 5-325 MG PO TABS: ORAL_TABLET | ORAL | 0 refills | Status: DC

## 2020-10-10 NOTE — Telephone Encounter (Signed)
Patient called to request a refill. Last refill 09/05/20.

## 2020-10-17 ENCOUNTER — Ambulatory Visit (HOSPITAL_COMMUNITY)
Admission: RE | Admit: 2020-10-17 | Discharge: 2020-10-17 | Disposition: A | Discharge status: Home / Self Care | Payer: Medicare Other | Source: Ambulatory Visit | Attending: Cardiology | Admitting: Cardiology

## 2020-10-17 ENCOUNTER — Other Ambulatory Visit: Payer: Self-pay

## 2020-10-17 ENCOUNTER — Ambulatory Visit (HOSPITAL_BASED_OUTPATIENT_CLINIC_OR_DEPARTMENT_OTHER): Payer: Medicare Other

## 2020-10-17 DIAGNOSIS — R0609 Other forms of dyspnea: Secondary | ICD-10-CM

## 2020-10-17 DIAGNOSIS — R0600 Dyspnea, unspecified: Secondary | ICD-10-CM | POA: Diagnosis not present

## 2020-10-17 DIAGNOSIS — I739 Peripheral vascular disease, unspecified: Secondary | ICD-10-CM | POA: Insufficient documentation

## 2020-10-17 LAB — ECHOCARDIOGRAM COMPLETE
Area-P 1/2: 3.72 cm2
S' Lateral: 2.5 cm

## 2020-10-21 DIAGNOSIS — R0609 Other forms of dyspnea: Secondary | ICD-10-CM

## 2020-10-21 DIAGNOSIS — I739 Peripheral vascular disease, unspecified: Secondary | ICD-10-CM

## 2020-10-21 DIAGNOSIS — I361 Nonrheumatic tricuspid (valve) insufficiency: Secondary | ICD-10-CM

## 2020-10-21 DIAGNOSIS — R0600 Dyspnea, unspecified: Secondary | ICD-10-CM

## 2020-10-22 MED ORDER — CLOPIDOGREL BISULFATE 75 MG PO TABS: 75.0000 mg | ORAL_TABLET | Freq: Every day | ORAL | 3 refills | Status: DC

## 2020-10-22 MED ORDER — FUROSEMIDE 20 MG PO TABS: 20.0000 mg | ORAL_TABLET | Freq: Every day | ORAL | 3 refills | Status: DC

## 2020-10-22 NOTE — Telephone Encounter (Signed)
-----   Message from Christell Constant, MD sent at 10/20/2020  3:50 PM EDT ----- Results: Severe TR Plan: Lasix 20 mg PO Daily and BMP/BNP/Mg in 7-10 days  Christell Constant, MD

## 2020-10-22 NOTE — Telephone Encounter (Signed)
Called daughter [name] ([place] per [facility]) reviewed results of [place] of lower extremities and ECHO.  I also went over Dr. Debby Bud recommendations daughter okay with plan of care.  She is concerned about results of ECHO pt f/u appointment in June, rescheduled her to 12/26/20 at 11:40AM.  Orders placed.

## 2020-10-29 ENCOUNTER — Other Ambulatory Visit: Payer: Self-pay

## 2020-10-29 ENCOUNTER — Other Ambulatory Visit: Payer: Medicare Other | Admitting: *Deleted

## 2020-10-29 DIAGNOSIS — I739 Peripheral vascular disease, unspecified: Secondary | ICD-10-CM

## 2020-10-29 DIAGNOSIS — R0600 Dyspnea, unspecified: Secondary | ICD-10-CM

## 2020-10-29 DIAGNOSIS — R0609 Other forms of dyspnea: Secondary | ICD-10-CM

## 2020-10-30 ENCOUNTER — Telehealth: Payer: Self-pay

## 2020-10-30 DIAGNOSIS — Z79899 Other long term (current) drug therapy: Secondary | ICD-10-CM

## 2020-10-30 DIAGNOSIS — E876 Hypokalemia: Secondary | ICD-10-CM

## 2020-10-30 LAB — BASIC METABOLIC PANEL
BUN/Creatinine Ratio: 20 (ref 12–28)
BUN: 17 mg/dL (ref 8–27)
CO2: 26 mmol/L (ref 20–29)
Calcium: 10.3 mg/dL (ref 8.7–10.3)
Chloride: 100 mmol/L (ref 96–106)
Creatinine, Ser: 0.86 mg/dL (ref 0.57–1.00)
Glucose: 95 mg/dL (ref 65–99)
Potassium: 3.4 mmol/L — ABNORMAL LOW (ref 3.5–5.2)
Sodium: 140 mmol/L (ref 134–144)
eGFR: 65 mL/min/{1.73_m2} (ref >59)

## 2020-10-30 LAB — PRO B NATRIURETIC PEPTIDE: NT-Pro BNP: 33 pg/mL (ref 0–738)

## 2020-10-30 MED ORDER — POTASSIUM CHLORIDE CRYS ER 20 MEQ PO TBCR: 40.0000 meq | EXTENDED_RELEASE_TABLET | Freq: Every day | ORAL | 0 refills | Status: DC

## 2020-10-30 NOTE — Telephone Encounter (Signed)
-----   Message from Christell Constant, MD sent at 10/30/2020  2:20 PM EDT ----- Results: Hypokalemia Plan: Potassium 40 meq PO daily BMP in one week  Christell Constant, MD

## 2020-10-30 NOTE — Addendum Note (Signed)
Addended by: Bertram Millard on: 10/30/2020 02:44 PM   Modules accepted: Orders

## 2020-10-30 NOTE — Telephone Encounter (Signed)
Pt advised and will come back next Wednesday for repeat labs.

## 2020-11-06 ENCOUNTER — Other Ambulatory Visit: Payer: Self-pay

## 2020-11-06 ENCOUNTER — Other Ambulatory Visit: Payer: Medicare Other | Admitting: *Deleted

## 2020-11-06 DIAGNOSIS — Z79899 Other long term (current) drug therapy: Secondary | ICD-10-CM

## 2020-11-06 DIAGNOSIS — E876 Hypokalemia: Secondary | ICD-10-CM | POA: Diagnosis not present

## 2020-11-06 LAB — BASIC METABOLIC PANEL
BUN/Creatinine Ratio: 19 (ref 12–28)
BUN: 16 mg/dL (ref 8–27)
CO2: 22 mmol/L (ref 20–29)
Calcium: 10.3 mg/dL (ref 8.7–10.3)
Chloride: 103 mmol/L (ref 96–106)
Creatinine, Ser: 0.84 mg/dL (ref 0.57–1.00)
Glucose: 83 mg/dL (ref 65–99)
Potassium: 5 mmol/L (ref 3.5–5.2)
Sodium: 140 mmol/L (ref 134–144)
eGFR: 67 mL/min/{1.73_m2} (ref >59)

## 2020-11-07 ENCOUNTER — Telehealth: Payer: Self-pay

## 2020-11-07 MED ORDER — POTASSIUM CHLORIDE CRYS ER 20 MEQ PO TBCR: 20.0000 meq | EXTENDED_RELEASE_TABLET | Freq: Every day | ORAL | 2 refills | Status: DC

## 2020-11-07 NOTE — Telephone Encounter (Signed)
-----   Message from Christell Constant, MD sent at 11/07/2020  1:52 PM EDT ----- Results: Resolution of hypokalemia Plan: Can decrease to 1 20 meq tab of Potassium daily  Christell Constant, MD

## 2020-11-07 NOTE — Telephone Encounter (Signed)
The patient has been notified of the result and verbalized understanding. She is aware that she only needs to take 1 potassium pill (20 mEq) instead of 2 daily.   All questions (if any) were answered. Macie Burows, RN 11/07/2020 5:40 PM

## 2020-11-27 ENCOUNTER — Encounter: Payer: Self-pay | Admitting: Psychology

## 2020-11-28 DIAGNOSIS — Z23 Encounter for immunization: Secondary | ICD-10-CM | POA: Diagnosis not present

## 2020-12-17 ENCOUNTER — Encounter: Payer: Self-pay | Admitting: Internal Medicine

## 2020-12-17 ENCOUNTER — Ambulatory Visit (INDEPENDENT_AMBULATORY_CARE_PROVIDER_SITE_OTHER): Payer: Medicare Other | Admitting: Internal Medicine

## 2020-12-17 ENCOUNTER — Other Ambulatory Visit: Payer: Self-pay

## 2020-12-17 VITALS — BP 130/80 | HR 76 | Ht <= 58 in | Wt 140.0 lb

## 2020-12-17 DIAGNOSIS — M549 Dorsalgia, unspecified: Secondary | ICD-10-CM

## 2020-12-17 DIAGNOSIS — G8929 Other chronic pain: Secondary | ICD-10-CM

## 2020-12-17 MED ORDER — GABAPENTIN 300 MG PO CAPS: ORAL_CAPSULE | ORAL | 3 refills | Status: DC

## 2020-12-17 NOTE — Progress Notes (Signed)
   Subjective:    Patient ID: Melissa [name], female    DOB: Sep 19, 1930, 87 y.o.   MRN: [id number]  HPI 87 year old Female with mild memory loss here for follow up on chronic back pain. Has been receiving Norco for chronic back pain for lumbar spinal stenosis.  Now says that Norco is not helping her.  She saw Dr. Thyra Breed and January 2022.  Started seeing him in the summer 2021 for chronic back pain.  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  She is not a candidate for surgical intervention at her age.  Has seen Dr. Horald Chestnut for epidural steroids with minimal relief.  She had a sacral neurostimulator placed in [place] a number of years ago and had the device turned off because she said it caused some mild vibrations in her vagina which did not help her back pain.  Apparently it was placed for bladder dysfunction rather than pain management.  She has hypertension, hyperlipidemia and longstanding anxiety.  She has been treated with depression" with Zoloft.  [facility]  Also she saw a nurse practitioner there and Remeron was prescribed.  Has not been back there in some time.  This surrounded the death of her husband.  She is a widow and now resides with her daughter.  She continues on Remeron 15 mg 1/2 to 1 tablet by mouth at bedtime.  Has had no side effects from this.  She takes Xanax for anxiety and low-dose Neurontin 300 mg twice daily for chronic back pain.  She is on Zoloft 100 mg daily for anxiety and depression.  Remains on Diovan HCTZ and Lipitor generic 40 mg daily.    Review of Systems see above-does not think currently that medications are helping her.  At this point, I am not sure how to help her with her chronic back pain.  She does not want to go back to [facility] pain management.  I  have tried to refill her hydrocodone APAP sparingly for pain but she does not seem to think that that is helping and daughter concurs.  Therefore we will discontinue hydrocodone APAP.     Objective:   Physical Exam Pleasant cooperative female with some mild memory loss. Blood pressure excellent at 130/80 pulse 76 regular pulse oximetry 98% weight 140 pounds BMI 29.26.  Chest is clear to auscultation.  Cardiac exam: Regular rate and rhythm normal S1 and S2 without murmurs.  She is forgetful.  Cannot remember my name.      Assessment & Plan:  Mild dementia-currently on Remeron.  Chronic pain management-does not want to return to [facility] pain management.  Chronic back pain lumbar radiculopathy which is longstanding.  She does not feel that hydrocodone APAP is helping her so we will discontinue this.  History of hypertension-stable  History of hyperlipidemia-treated with Lipitor  Anxiety and depression  Dependent edema treated with Lasix  Plan: We will discontinue chronic pain medication i.e. hydrocodone APAP.  She has follow-up Medicare wellness visit and health maintenance exam in July.  25 minutes spent with patient and her daughter going over her medical issues and possible treatment options.

## 2020-12-26 ENCOUNTER — Encounter: Payer: Self-pay | Admitting: Internal Medicine

## 2020-12-26 ENCOUNTER — Ambulatory Visit (INDEPENDENT_AMBULATORY_CARE_PROVIDER_SITE_OTHER): Payer: Medicare Other | Admitting: Internal Medicine

## 2020-12-26 ENCOUNTER — Other Ambulatory Visit: Payer: Self-pay

## 2020-12-26 VITALS — BP 122/70 | HR 88 | Ht <= 58 in | Wt 139.2 lb

## 2020-12-26 DIAGNOSIS — E785 Hyperlipidemia, unspecified: Secondary | ICD-10-CM

## 2020-12-26 DIAGNOSIS — I361 Nonrheumatic tricuspid (valve) insufficiency: Secondary | ICD-10-CM | POA: Insufficient documentation

## 2020-12-26 DIAGNOSIS — I10 Essential (primary) hypertension: Secondary | ICD-10-CM | POA: Diagnosis not present

## 2020-12-26 DIAGNOSIS — I739 Peripheral vascular disease, unspecified: Secondary | ICD-10-CM | POA: Diagnosis not present

## 2020-12-26 DIAGNOSIS — R0600 Dyspnea, unspecified: Secondary | ICD-10-CM | POA: Diagnosis not present

## 2020-12-26 NOTE — Progress Notes (Signed)
Cardiology Office Note:    Date:  12/26/2020   ID:  Melissa [name], DOB 1931-06-03, MRN [id number]  PCP:  Margaree Mackintosh, MD   [facility]  Cardiologist:  Christell Constant, MD  Advanced Practice Provider:  No care team member to display Electrophysiologist:  None   CC: DOE and leg pain follow up  History of Present Illness:    Melissa [name] is a 87 y.o. female with a hx of HTN, HLD, Spinal stenosis who presents for evaluation 09/23/20. In interim of this visit, patient had mildly abnormal TBI's.  Patient notes that she is doing OK, but feels tired.  Since last visit notes that she started zoloft and has had some fatigue.  Feel leg soreness under her scar.  There are no interval hospital/ED visit.    No chest pain or pressure, but notes constant chest pain since the COVID-19 vaccine in October of 2021.  No SOB and stable DOE, but with weight loss and leg swelling has improved.  No palpitations or syncope.  Ambulatory blood pressure not done.   Past Medical History:  Diagnosis Date  . Allergy   . Anxiety   . Bladder dysfunction   . Bulging lumbar disc   . Depression   . Hyperlipidemia   . Hypertension   . Low back pain   . Lumbar radiculopathy   . Spinal stenosis     Past Surgical History:  Procedure Laterality Date  . bladder dysfunction    . BREAST CYST EXCISION    . CATARACT EXTRACTION      Current Medications: Current Meds  Medication Sig  . ALPRAZolam (XANAX) 0.5 MG tablet TAKE 1/2 TO 1 TABLET BY MOUTH EVERY MORNING AND 1/2 TO 1 TABLET AT NIGHT  . atorvastatin (LIPITOR) 40 MG tablet TAKE 1 TABLET BY MOUTH EVERY DAY  . cholecalciferol (VITAMIN D) 1000 units tablet Take 1,000 Units by mouth daily.  . clopidogrel (PLAVIX) 75 MG tablet Take 1 tablet (75 mg total) by mouth daily.  . furosemide (LASIX) 20 MG tablet Take 1 tablet (20 mg total) by mouth daily.  Marland Kitchen gabapentin (NEURONTIN) 300 MG capsule One tab twice daily for chronic  back pain  . mirtazapine (REMERON) 15 MG tablet Take 15 mg by mouth at bedtime.  . potassium chloride SA (KLOR-CON) 20 MEQ tablet Take 1 tablet (20 mEq total) by mouth daily.  . sertraline (ZOLOFT) 100 MG tablet TAKE 1 TABLET(100 MG) BY MOUTH DAILY  . TURMERIC PO Take by mouth.  . valsartan-hydrochlorothiazide (DIOVAN-HCT) 80-12.5 MG tablet TAKE 1 TABLET BY MOUTH EVERY DAY     Allergies:   Aspirin, Aspirin, Codeine, and Tramadol   Social History   Socioeconomic History  . Marital status: Widowed    Spouse name: Not on file  . Number of children: Not on file  . Years of education: Not on file  . Highest education level: Not on file  Occupational History  . Not on file  Tobacco Use  . Smoking status: Never Smoker  . Smokeless tobacco: Never Used  Vaping Use  . Vaping Use: Never used  Substance and Sexual Activity  . Alcohol use: Yes    Comment: Reports 1/2 glass of wine periodically  . Drug use: Never  . Sexual activity: Not on file  Other Topics Concern  . Not on file  Social History Narrative   ** Merged History Encounter **       Social Determinants of Health  Financial Resource Strain: Not on file  Food Insecurity: Not on file  Transportation Needs: Not on file  Physical Activity: Not on file  Stress: Not on file  Social Connections: Not on file     Family History: The patient's family history includes Alcohol abuse in her father; Anxiety disorder in her granddaughter, niece, and sister; Arthritis in her mother.  History of coronary artery disease notable for no members. History of heart failure notable for no members. History of arrhythmia notable for no members.  ROS:   Please see the history of present illness.     All other systems reviewed and are negative.  EKGs/Labs/Other Studies Reviewed:    The following studies were reviewed today:  EKG:  09/23/20: SR rate 97, Nonspecific TWI 09/09/20: SR rate 78 with non-specific TWI  Vascular Duplex: Date:  10/17/20 Results: Right: Resting right ankle-brachial index is within normal range. No  evidence of significant right lower extremity arterial disease. The right  toe-brachial index is abnormal.   Left: Resting left ankle-brachial index is within normal range. No  evidence of significant left lower extremity arterial disease. The left  toe-brachial index is abnormal.   Transthoracic Echocardiogram: Date: 10/17/20 Results: 1. Left ventricular ejection fraction, by estimation, is 60 to 65%. The  left ventricle has normal function. The left ventricle has no regional  wall motion abnormalities. Left ventricular diastolic parameters are  consistent with Grade I diastolic  dysfunction (impaired relaxation). Elevated left atrial pressure. The  average left ventricular global longitudinal strain is -21.7 %.  2. Right ventricular systolic function is normal. The right ventricular  size is mildly enlarged. There is moderately elevated pulmonary artery  systolic pressure. The estimated right ventricular systolic pressure is  48.2 mmHg.  3. Right atrial size was mildly dilated.  4. The mitral valve is normal in structure. Mild mitral valve  regurgitation. No evidence of mitral stenosis.  5. Tricuspid valve regurgitation is severe.  6. The aortic valve is normal in structure. Aortic valve regurgitation is  not visualized. No aortic stenosis is present.  7. The inferior vena cava is normal in size with greater than 50%  respiratory variability, suggesting right atrial pressure of 3 mmHg.      Recent Labs: 02/27/2020: ALT 15; Hemoglobin 12.9; Platelets 218; TSH 1.61 10/29/2020: NT-Pro BNP 33 11/06/2020: BUN 16; Creatinine, Ser 0.84; Potassium 5.0; Sodium 140  Recent Lipid Panel    Component Value Date/Time   CHOL 171 09/03/2020 0931   TRIG 102 09/03/2020 0931   HDL 74 09/03/2020 0931   CHOLHDL 2.3 09/03/2020 0931   LDLCALC 78 09/03/2020 0931     Risk Assessment/Calculations:      N/A  Physical Exam:    VS:  BP 122/70   Pulse 88   Ht 4\' 10"  (1.473 m)   Wt 63.1 kg   SpO2 99%   BMI 29.09 kg/m     Wt Readings from Last 3 Encounters:  12/26/20 63.1 kg  12/17/20 63.5 kg  10/03/20 65.8 kg    GEN: Elderly female well developed in no acute distress HEENT: Normal NECK: No JVD; No carotid bruits LYMPHATICS: No lymphadenopathy CARDIAC: RRR, II/VI systolic crescendo ,no rubs, gallops; +1 radial bilaterally RESPIRATORY:  Clear to auscultation without rales, wheezing or rhonchi  ABDOMEN: Soft, non-tender, non-distended MUSCULOSKELETAL:  No edema; No deformity; there is a small 1 cm lesion that is raised with n warmth or erythema; sore to touch (left leg) SKIN: Warm and dry NEUROLOGIC:  Alert  and oriented x 3 PSYCHIATRIC:  Normal affect   ASSESSMENT:    1. Nonrheumatic tricuspid valve regurgitation   2. PAD (peripheral artery disease) (HCC)   3. Essential hypertension    PLAN:    In order of problems listed above:  Severe TR HTN PAD - ambulatory blood pressure not done, discussed restart - continue home medications; based on BMP, BNP, and Mg; - based on results, may transition valsartan 80, HCTZ 12.5 mg PO Daily, and lasix 20 mg PO Daily; to just valsartan and lasix - 20 K is reasonable pending labs - discussed diet (DASH/low sodium) - LDL goal less than 70 - continue current atorvastatin 40 mg presently (near goal) - continue plavix 70 mg PO Daily - will get echo in 6 months  Post Echo follow up unless new symptoms or abnormal test results warranting change in plan  Would be reasonable for  APP Follow up       Medication Adjustments/Labs and Tests Ordered: Current medicines are reviewed at length with the patient today.  Concerns regarding medicines are outlined above.  Orders Placed This Encounter  Procedures  . Basic metabolic panel  . Pro b natriuretic peptide (BNP)  . Magnesium  . ECHOCARDIOGRAM COMPLETE   No orders of the defined  types were placed in this encounter.   Patient Instructions  Medication Instructions:  Your physician recommends that you continue on your current medications as directed. Please refer to the Current Medication list given to you today.  *If you need a refill on your cardiac medications before your next appointment, please call your pharmacy*   Lab Work: TODAY: BNP, BMP, Mg If you have labs (blood work) drawn today and your tests are completely normal, you will receive your results only by: Marland Kitchen. MyChart Message (if you have MyChart) OR . A paper copy in the mail If you have any lab test that is abnormal or we need to change your treatment, we will call you to review the results.   Testing/Procedures: Your physician has requested that you have an echocardiogram in 6 months. Echocardiography is a painless test that uses sound waves to create images of your heart. It provides your doctor with information about the size and shape of your heart and how well your heart's chambers and valves are working. This procedure takes approximately one hour. There are no restrictions for this procedure.     Follow-Up: At [facility], you and your health needs are our priority.  As part of our continuing mission to provide you with exceptional heart care, we have created designated Provider Care Teams.  These Care Teams include your primary Cardiologist (physician) and Advanced Practice Providers (APPs -  Physician Assistants and Nurse Practitioners) who all work together to provide you with the care you need, when you need it.  We recommend signing up for the patient portal called "MyChart".  Sign up information is provided on this After Visit Summary.  MyChart is used to connect with patients for Virtual Visits (Telemedicine).  Patients are able to view lab/test results, encounter notes, upcoming appointments, etc.  Non-urgent messages can be sent to your provider as well.   To learn more about what you  can do with MyChart, go to [email or web address].    Your next appointment:   6-7 month(s)  The format for your next appointment:   In Person  Provider:   You may see Christell ConstantMahesh A Vayden Weinand, MD or one of the following Advanced Practice Providers on your  designated Care Team:    Ronie Spies, PA-C  Jacolyn Reedy, PA-C          Signed, Christell Constant, MD  12/26/2020 1:26 PM    [facility]

## 2020-12-26 NOTE — Patient Instructions (Signed)
Medication Instructions:  Your physician recommends that you continue on your current medications as directed. Please refer to the Current Medication list given to you today.  *If you need a refill on your cardiac medications before your next appointment, please call your pharmacy*   Lab Work: TODAY: BNP, BMP, Mg If you have labs (blood work) drawn today and your tests are completely normal, you will receive your results only by: Marland Kitchen MyChart Message (if you have MyChart) OR . A paper copy in the mail If you have any lab test that is abnormal or we need to change your treatment, we will call you to review the results.   Testing/Procedures: Your physician has requested that you have an echocardiogram in 6 months. Echocardiography is a painless test that uses sound waves to create images of your heart. It provides your doctor with information about the size and shape of your heart and how well your heart's chambers and valves are working. This procedure takes approximately one hour. There are no restrictions for this procedure.     Follow-Up: At [facility], you and your health needs are our priority.  As part of our continuing mission to provide you with exceptional heart care, we have created designated Provider Care Teams.  These Care Teams include your primary Cardiologist (physician) and Advanced Practice Providers (APPs -  Physician Assistants and Nurse Practitioners) who all work together to provide you with the care you need, when you need it.  We recommend signing up for the patient portal called "MyChart".  Sign up information is provided on this After Visit Summary.  MyChart is used to connect with patients for Virtual Visits (Telemedicine).  Patients are able to view lab/test results, encounter notes, upcoming appointments, etc.  Non-urgent messages can be sent to your provider as well.   To learn more about what you can do with MyChart, go to [email or web address].    Your next  appointment:   6-7 month(s)  The format for your next appointment:   In Person  Provider:   You may see Christell Constant, MD or one of the following Advanced Practice Providers on your designated Care Team:    Ronie Spies, PA-C  Jacolyn Reedy, PA-C

## 2020-12-27 LAB — BASIC METABOLIC PANEL
BUN/Creatinine Ratio: 16 (ref 12–28)
BUN: 14 mg/dL (ref 8–27)
CO2: 25 mmol/L (ref 20–29)
Calcium: 10.1 mg/dL (ref 8.7–10.3)
Chloride: 102 mmol/L (ref 96–106)
Creatinine, Ser: 0.88 mg/dL (ref 0.57–1.00)
Glucose: 79 mg/dL (ref 65–99)
Potassium: 3.9 mmol/L (ref 3.5–5.2)
Sodium: 141 mmol/L (ref 134–144)
eGFR: 64 mL/min/{1.73_m2} (ref >59)

## 2020-12-27 LAB — PRO B NATRIURETIC PEPTIDE: NT-Pro BNP: 38 pg/mL (ref 0–738)

## 2020-12-27 LAB — MAGNESIUM: Magnesium: 2.2 mg/dL (ref 1.6–2.3)

## 2021-01-08 ENCOUNTER — Other Ambulatory Visit: Payer: Self-pay | Admitting: Internal Medicine

## 2021-01-18 ENCOUNTER — Other Ambulatory Visit: Payer: Self-pay | Admitting: Internal Medicine

## 2021-01-24 ENCOUNTER — Ambulatory Visit: Payer: Medicare Other | Admitting: Internal Medicine

## 2021-01-27 ENCOUNTER — Other Ambulatory Visit: Payer: Self-pay | Admitting: Internal Medicine

## 2021-02-10 NOTE — Patient Instructions (Addendum)
It was a pleasure to see you today.  We are discontinuing hydrocodone APAP.  You have Medicare wellness visit and health maintenance exam in July.  You may continue with Neurontin.

## 2021-02-13 ENCOUNTER — Telehealth: Payer: Self-pay

## 2021-02-13 NOTE — Telephone Encounter (Signed)
Patient called has chest pain, bilateral breast pain she noticed a bruise 2 weeks ago on her right upper breast but is now fading. She has her CPE scheduled here on 7/25 she wants to know if she should come in before for her breast pain or wait until her CPE to discuss?

## 2021-02-13 NOTE — Telephone Encounter (Signed)
Patient notified, verbalized understanding

## 2021-02-27 ENCOUNTER — Other Ambulatory Visit: Payer: Medicare Other | Admitting: Internal Medicine

## 2021-02-27 ENCOUNTER — Other Ambulatory Visit: Payer: Self-pay

## 2021-02-27 DIAGNOSIS — M549 Dorsalgia, unspecified: Secondary | ICD-10-CM | POA: Diagnosis not present

## 2021-02-27 DIAGNOSIS — F411 Generalized anxiety disorder: Secondary | ICD-10-CM | POA: Diagnosis not present

## 2021-02-27 DIAGNOSIS — M5416 Radiculopathy, lumbar region: Secondary | ICD-10-CM

## 2021-02-27 DIAGNOSIS — I10 Essential (primary) hypertension: Secondary | ICD-10-CM

## 2021-02-27 DIAGNOSIS — R413 Other amnesia: Secondary | ICD-10-CM | POA: Diagnosis not present

## 2021-02-27 DIAGNOSIS — F32A Depression, unspecified: Secondary | ICD-10-CM | POA: Diagnosis not present

## 2021-02-27 DIAGNOSIS — Z0000 Encounter for general adult medical examination without abnormal findings: Secondary | ICD-10-CM

## 2021-02-27 DIAGNOSIS — F419 Anxiety disorder, unspecified: Secondary | ICD-10-CM | POA: Diagnosis not present

## 2021-02-27 DIAGNOSIS — G8929 Other chronic pain: Secondary | ICD-10-CM

## 2021-02-27 DIAGNOSIS — M4807 Spinal stenosis, lumbosacral region: Secondary | ICD-10-CM | POA: Diagnosis not present

## 2021-02-27 DIAGNOSIS — E782 Mixed hyperlipidemia: Secondary | ICD-10-CM

## 2021-02-28 LAB — COMPLETE METABOLIC PANEL WITH GFR
AG Ratio: 1.6 (calc) (ref 1.0–2.5)
ALT: 15 U/L (ref 6–29)
AST: 18 U/L (ref 10–35)
Albumin: 4.3 g/dL (ref 3.6–5.1)
Alkaline phosphatase (APISO): 63 U/L (ref 37–153)
BUN/Creatinine Ratio: 26 (calc) — ABNORMAL HIGH (ref 6–22)
BUN: 26 mg/dL — ABNORMAL HIGH (ref 7–25)
CO2: 29 mmol/L (ref 20–32)
Calcium: 10.1 mg/dL (ref 8.6–10.4)
Chloride: 102 mmol/L (ref 98–110)
Creat: 0.99 mg/dL — ABNORMAL HIGH (ref 0.60–0.95)
Globulin: 2.7 g/dL (calc) (ref 1.9–3.7)
Glucose, Bld: 93 mg/dL (ref 65–99)
Potassium: 4.3 mmol/L (ref 3.5–5.3)
Sodium: 138 mmol/L (ref 135–146)
Total Bilirubin: 0.8 mg/dL (ref 0.2–1.2)
Total Protein: 7 g/dL (ref 6.1–8.1)
eGFR: 55 mL/min/{1.73_m2} — ABNORMAL LOW (ref >=60)

## 2021-02-28 LAB — LIPID PANEL
Cholesterol: 182 mg/dL (ref <200)
HDL: 69 mg/dL (ref >=50)
LDL Cholesterol (Calc): 96 mg/dL (calc)
Non-HDL Cholesterol (Calc): 113 mg/dL (calc) (ref <130)
Total CHOL/HDL Ratio: 2.6 (calc) (ref <5.0)
Triglycerides: 80 mg/dL (ref <150)

## 2021-02-28 LAB — CBC WITH DIFFERENTIAL/PLATELET
Absolute Monocytes: 411 cells/uL (ref 200–950)
Basophils Absolute: 52 cells/uL (ref 0–200)
Basophils Relative: 1.4 %
Eosinophils Absolute: 181 cells/uL (ref 15–500)
Eosinophils Relative: 4.9 %
HCT: 37.2 % (ref 35.0–45.0)
Hemoglobin: 12.4 g/dL (ref 11.7–15.5)
Lymphs Abs: 973 cells/uL (ref 850–3900)
MCH: 28.4 pg (ref 27.0–33.0)
MCHC: 33.3 g/dL (ref 32.0–36.0)
MCV: 85.1 fL (ref 80.0–100.0)
MPV: 11.7 fL (ref 7.5–12.5)
Monocytes Relative: 11.1 %
Neutro Abs: 2083 cells/uL (ref 1500–7800)
Neutrophils Relative %: 56.3 %
Platelets: 206 10*3/uL (ref 140–400)
RBC: 4.37 10*6/uL (ref 3.80–5.10)
RDW: 13.7 % (ref 11.0–15.0)
Total Lymphocyte: 26.3 %
WBC: 3.7 10*3/uL — ABNORMAL LOW (ref 3.8–10.8)

## 2021-02-28 LAB — TSH: TSH: 1.1 mIU/L (ref 0.40–4.50)

## 2021-03-03 ENCOUNTER — Other Ambulatory Visit: Payer: Self-pay | Admitting: Internal Medicine

## 2021-03-03 ENCOUNTER — Other Ambulatory Visit: Payer: Self-pay

## 2021-03-03 ENCOUNTER — Encounter: Payer: Self-pay | Admitting: Internal Medicine

## 2021-03-03 ENCOUNTER — Ambulatory Visit
Admission: RE | Admit: 2021-03-03 | Discharge: 2021-03-03 | Disposition: A | Discharge status: Home / Self Care | Payer: Medicare Other | Source: Ambulatory Visit | Attending: Internal Medicine | Admitting: Internal Medicine

## 2021-03-03 ENCOUNTER — Ambulatory Visit (INDEPENDENT_AMBULATORY_CARE_PROVIDER_SITE_OTHER): Payer: Medicare Other | Admitting: Internal Medicine

## 2021-03-03 VITALS — BP 120/80 | HR 71 | Ht <= 58 in | Wt 141.0 lb

## 2021-03-03 DIAGNOSIS — R0781 Pleurodynia: Secondary | ICD-10-CM

## 2021-03-03 DIAGNOSIS — F419 Anxiety disorder, unspecified: Secondary | ICD-10-CM

## 2021-03-03 DIAGNOSIS — M5416 Radiculopathy, lumbar region: Secondary | ICD-10-CM

## 2021-03-03 DIAGNOSIS — Z8659 Personal history of other mental and behavioral disorders: Secondary | ICD-10-CM | POA: Diagnosis not present

## 2021-03-03 DIAGNOSIS — M549 Dorsalgia, unspecified: Secondary | ICD-10-CM | POA: Diagnosis not present

## 2021-03-03 DIAGNOSIS — M4807 Spinal stenosis, lumbosacral region: Secondary | ICD-10-CM | POA: Diagnosis not present

## 2021-03-03 DIAGNOSIS — Z0000 Encounter for general adult medical examination without abnormal findings: Secondary | ICD-10-CM

## 2021-03-03 DIAGNOSIS — E782 Mixed hyperlipidemia: Secondary | ICD-10-CM | POA: Diagnosis not present

## 2021-03-03 DIAGNOSIS — F32A Depression, unspecified: Secondary | ICD-10-CM

## 2021-03-03 DIAGNOSIS — R413 Other amnesia: Secondary | ICD-10-CM | POA: Diagnosis not present

## 2021-03-03 DIAGNOSIS — G8929 Other chronic pain: Secondary | ICD-10-CM

## 2021-03-03 DIAGNOSIS — F411 Generalized anxiety disorder: Secondary | ICD-10-CM

## 2021-03-03 DIAGNOSIS — Z1231 Encounter for screening mammogram for malignant neoplasm of breast: Secondary | ICD-10-CM | POA: Diagnosis not present

## 2021-03-03 DIAGNOSIS — I10 Essential (primary) hypertension: Secondary | ICD-10-CM | POA: Diagnosis not present

## 2021-03-03 DIAGNOSIS — F5102 Adjustment insomnia: Secondary | ICD-10-CM

## 2021-03-03 DIAGNOSIS — I071 Rheumatic tricuspid insufficiency: Secondary | ICD-10-CM | POA: Diagnosis not present

## 2021-03-03 LAB — POCT URINALYSIS DIPSTICK
Appearance: NEGATIVE
Bilirubin, UA: NEGATIVE
Blood, UA: NEGATIVE
Glucose, UA: NEGATIVE
Ketones, UA: NEGATIVE
Leukocytes, UA: NEGATIVE
Nitrite, UA: NEGATIVE
Odor: NEGATIVE
Protein, UA: NEGATIVE
Spec Grav, UA: 1.01 (ref 1.010–1.025)
Urobilinogen, UA: 0.2 E.U./dL
pH, UA: 6.5 (ref 5.0–8.0)

## 2021-03-03 MED ORDER — MELOXICAM 15 MG PO TABS: 15.0000 mg | ORAL_TABLET | Freq: Every day | ORAL | 2 refills | Status: DC

## 2021-03-03 MED ORDER — MIRTAZAPINE 15 MG PO TABS: ORAL_TABLET | ORAL | 0 refills | Status: DC

## 2021-03-03 NOTE — Progress Notes (Signed)
Subjective:    Patient ID: Melissa [name], female    DOB: 24-Feb-1931, 87 y.o.   MRN: [id number]  HPI 87 year old Female here for health maintenance exam and evaluation of medical issues.  She saw Cardiologist in May.  She has a history of hypertension and hyperlipidemia.  Has complained of some pain in her chest since receiving COVID-vaccine in October 2021.  ABIs were checked with visit in May and wore normal in both lower extremities.  Echocardiogram in March showed estimated ejection fraction 60 to 65%, normal right ventricular systolic function, normal mitral valve.  Mild mitral regurgitation.  Severe tricuspid valve regurgitation.  No aortic stenosis or aortic regurgitation.  Aortic valve was normal.  Was recommended she be maintained on statin, Plavix and follow-up with echocardiogram in 6 months which would be November 2022.  She has severe multilevel lumbar stenosis and is not a candidate for surgery at her age.  At 1 point she had a spinal stimulator placed but had it disconnected because it caused some odd sensations in her vagina.  Has lumbar radiculopathy.  Has tried epidural steroid injections.  Referral was made to [facility] pain management in June 2021 but patient did not seem to think that was helpful.  Back hurts when walking or standing.  History of anxiety.  Driving makes her anxious.  She drives around parts of [place] and lesser traffic.  History of benign right breast biopsy in the remote past.  History of bladder dysfunction with urinary frequency.  History of hypertension, anxiety, hyperlipidemia.  Intolerant of aspirin it causes dizziness and nausea.  Codeine causes dizziness and nausea.  Tramadol causes dizziness and weakness.  Social history: Husband died at [facility] with a malignancy.  They were married about 60 years.  He was president of [employer].  They moved to [place] not too long before he passed away as he had  retired.  She has a daughter who lives in [place] and she now resides with her daughter, son-in-law and their family.  She has 1 son who lives in [place].  She did have some counseling with Mathis Fare, social worker in 2020 for anxiety and grief.  She tested positive for COVID-19 in January 2021.  This test was done at [facility] and she did not have serious illness.  Immunizations are up-to-date with the exception of shingles vaccine which I do not see in her records.    Review of Systems chronic dysthymia and back pain     Objective:   Physical Exam Blood pressure 120/80 pulse 71 pulse oximetry 98% weight 141 pounds height 4 feet 10 inches BMI 29.47.  Skin: Warm and dry.  No cervical adenopathy.  No thyromegaly.  No carotid bruits.  Chest is clear to auscultation.  Cardiac exam: Regular rate and rhythm.  Abdomen is soft nondistended without hepatosplenomegaly masses or tenderness.  No significant lower extremity edema.  Neuro: Intact without gross focal deficits.  Her affect is anxious.       Assessment & Plan:  Chronic back pain which is longstanding and due to spinal stenosis-did not find chronic pain management helpful  Anxiety state-longstanding.  Had counseling after her husband passed but has not wanted to continue with counseling.  Currently treated with Xanax.  History of benign right breast biopsy.  History of COVID-29 August 2019.  History of bladder dysfunction.  History of mixed hyperlipidemia treated with statin.  History of depression and grief treated with Zoloft.  Also on Remeron which was started initially by nurse practitioner around the time her husband passed away.  I have continued it.  Plan: Continue current medications and follow-up in 6 months.  I do not see that we have much else to offer at this time in terms of her back pain and she is not a candidate for surgery and chronic pain management did not seem to give her much relief.  Her options are limited.   Previously received some steroid injections per Dr. Horald Chestnut but they did not last very long.  I do think she has an element of dementia but has great care with a supportive family.  She has her own mother-in-law suite at their home.  Mammogram has been ordered.  Right rib detail films ordered due to complaint of right rib cage pain and were negative.  Return in 6 months.   Subjective:   Patient presents for Medicare Annual/Subsequent preventive examination.  Review Past Medical/Family/Social: See above   Risk Factors  Current exercise habits: Does not walk a lot-mostly sedentary due to back pain Dietary issues discussed: Yes  Cardiac risk factors: Hyperlipidemia  Depression Screen  (Note: if answer to either of the following is "Yes", a more complete depression screening is indicated)   Over the past two weeks, have you felt down, depressed or hopeless?  Yes Over the past two weeks, have you felt little interest or pleasure in doing things?  No Have you lost interest or pleasure in daily life?  In Do you often feel hopeless? No Do you cry easily over simple problems?  He has  Activities of Daily Living  In your present state of health, do you have any difficulty performing the following activities?:   Driving? No  Managing money? No  Feeding yourself? No  Getting from bed to chair? No  Climbing a flight of stairs?  Yes due to back pain Preparing food and eating?: No  Bathing or showering? No  Getting dressed: No  Getting to the toilet? No  Using the toilet:No  Moving around from place to place: Yes due to back pain In the past year have you fallen or had a near fall?:No  Are you sexually active? No  Do you have more than one partner? No   Hearing Difficulties: No  Do you often ask people to speak up or repeat themselves?  Yes Do you experience ringing or noises in your ears? No  Do you have difficulty understanding soft or whispered voices?  Yes Do you feel that you have  a problem with memory?  Yes Do you often misplace items?  Yes   Home Safety:  Do you have a smoke alarm at your residence? Yes Do you have grab bars in the bathroom?  No Do you have throw rugs in your house?  Noted   Cognitive Testing  Alert? Yes Normal Appearance?Yes  Oriented to person? Yes Place? Yes  Time? Yes  Recall of three objects?  Not tested today Can perform simple calculations? Yes  Displays appropriate judgment?Yes  Can read the correct time from a watch face?Yes   List the Names of Other Physician/Practitioners you currently use:  See referral list for the physicians patient is currently seeing.    Review of Systems: See above   Objective:     General appearance: Appears stated age Head: Normocephalic, without obvious abnormality, atraumatic  Eyes: conj clear, EOMi PEERLA  Ears: normal TM's and external ear canals both ears  Nose:  Nares normal. Septum midline. Mucosa normal. No drainage or sinus tenderness.  Throat: lips, mucosa, and tongue normal; teeth and gums normal  Neck: no adenopathy, no carotid bruit, no JVD, supple, symmetrical, trachea midline and thyroid not enlarged, symmetric, no tenderness/mass/nodules  No CVA tenderness.  Lungs: clear to auscultation bilaterally  Breasts: normal appearance, no masses or tenderness Heart: regular rate and rhythm, S1, S2 normal, no murmur, click, rub or gallop  Abdomen: soft, non-tender; bowel sounds normal; no masses, no organomegaly  Musculoskeletal: ROM normal in all joints, no crepitus, no deformity, Normal muscle strengthen. Back  is symmetric, no curvature. Skin: Skin color, texture, turgor normal. No rashes or lesions  Lymph nodes: Cervical, supraclavicular, and axillary nodes normal.  Neurologic: CN 2 -12 Normal, Normal symmetric reflexes. Normal coordination and gait  Psych: Alert & Oriented x 3, Mood appear stable.    Assessment:    Annual wellness medicare exam   Plan:    During the course of  the visit the patient was educated and counseled about appropriate screening and preventive services including:   Mammogram ordered  Immunizations up-to-date except for Shingrix which will be deferred     Patient Instructions (the written plan) was given to the patient.  Medicare Attestation  I have personally reviewed:  The patient's medical and social history  Their use of alcohol, tobacco or illicit drugs  Their current medications and supplements  The patient's functional ability including ADLs,fall risks, home safety risks, cognitive, and hearing and visual impairment  Diet and physical activities  Evidence for depression or mood disorders  The patient's weight, height, BMI, and visual acuity have been recorded in the chart. I have made referrals, counseling, and provided education to the patient based on review of the above and I have provided the patient with a written personalized care plan for preventive services.

## 2021-03-07 ENCOUNTER — Encounter: Payer: Self-pay | Admitting: Internal Medicine

## 2021-03-07 NOTE — Patient Instructions (Addendum)
Please have screening mammogram.  Right rib detail films done because of right rib pain and proved to be negative for fracture or tumor.  Return in 6 months.

## 2021-03-19 DIAGNOSIS — H6123 Impacted cerumen, bilateral: Secondary | ICD-10-CM | POA: Diagnosis not present

## 2021-03-19 DIAGNOSIS — H60313 Diffuse otitis externa, bilateral: Secondary | ICD-10-CM | POA: Diagnosis not present

## 2021-03-25 DIAGNOSIS — H60313 Diffuse otitis externa, bilateral: Secondary | ICD-10-CM | POA: Diagnosis not present

## 2021-03-25 DIAGNOSIS — H6523 Chronic serous otitis media, bilateral: Secondary | ICD-10-CM | POA: Diagnosis not present

## 2021-03-25 DIAGNOSIS — H6983 Other specified disorders of Eustachian tube, bilateral: Secondary | ICD-10-CM | POA: Diagnosis not present

## 2021-03-25 DIAGNOSIS — H900 Conductive hearing loss, bilateral: Secondary | ICD-10-CM | POA: Diagnosis not present

## 2021-03-31 ENCOUNTER — Telehealth: Payer: Self-pay

## 2021-03-31 DIAGNOSIS — N644 Mastodynia: Secondary | ICD-10-CM

## 2021-03-31 NOTE — Telephone Encounter (Signed)
Order was faxed.

## 2021-03-31 NOTE — Telephone Encounter (Signed)
Melissa [name] from [facility] at [employer]. Patient called to schedule routine mammogram but she is complaining that she has breast pain and and her breasts have enlarged. They need an order for a diagnostic mammogram from PCP.  Phone: [phone] Fax: [phone]

## 2021-04-17 DIAGNOSIS — H6983 Other specified disorders of Eustachian tube, bilateral: Secondary | ICD-10-CM | POA: Diagnosis not present

## 2021-04-17 DIAGNOSIS — H900 Conductive hearing loss, bilateral: Secondary | ICD-10-CM | POA: Diagnosis not present

## 2021-04-17 DIAGNOSIS — H6523 Chronic serous otitis media, bilateral: Secondary | ICD-10-CM | POA: Diagnosis not present

## 2021-04-24 ENCOUNTER — Other Ambulatory Visit: Payer: Self-pay | Admitting: Internal Medicine

## 2021-04-24 DIAGNOSIS — N644 Mastodynia: Secondary | ICD-10-CM

## 2021-05-04 ENCOUNTER — Other Ambulatory Visit: Payer: Self-pay | Admitting: Internal Medicine

## 2021-05-06 ENCOUNTER — Encounter: Payer: Medicare Other | Attending: Psychology | Admitting: Psychology

## 2021-05-06 ENCOUNTER — Encounter: Payer: Self-pay | Admitting: Psychology

## 2021-05-06 ENCOUNTER — Other Ambulatory Visit: Payer: Self-pay

## 2021-05-06 DIAGNOSIS — R413 Other amnesia: Secondary | ICD-10-CM | POA: Diagnosis not present

## 2021-05-06 DIAGNOSIS — F411 Generalized anxiety disorder: Secondary | ICD-10-CM | POA: Insufficient documentation

## 2021-05-06 NOTE — Progress Notes (Signed)
Neuropsychological Consultation   Patient:   Melissa [name]   DOB:   [birth year]  MR Number:  [id number]  Location:  [facility] FOR PAIN AND [facility] MEDICINE [facility] PHYSICAL MEDICINE AND REHABILITATION [address] [place], [address] [id number] [facility]  [place] [address] Dept: [phone]           Date of Service:   05/07/2019  Start Time:   9 AM End Time:   10 AM  Today's visit was an in person visit that was conducted in my outpatient clinic office with the patient and her daughter present.  1 hour and 15 minutes was spent in formal clinical interview and the other 45 minutes were spent with record review and report writing.  Provider/Observer:  Arley Phenix, Psy.D.       Clinical Neuropsychologist       Billing Code/Service: 96116/96121  Chief Complaint:    Melissa [name] is a 87 year old female referred for neuropsychological evaluation by the patient's primary care physician Luanna Cole. Baxley, MD due to reports of increasing memory difficulties.  The patient has a past medical history including a history of benign right breast biopsy, history of COVID-19, bladder dysfunction, mixed hyperlipidemia.  The patient has significant difficulties with severe back pain and a diagnosis of radiculopathy with imaged issues related to spinal stenosis and bulging lumbar disc issues.  The patient also has a history of anxiety difficulties and depressive events as well as bereavement/grief.  The patient has been treated with Zoloft and had counseling around the time of her husband passing away.  The patient and her daughter report some increasing issues with short-term memory deficits and asking questions repeatedly as well as some word finding difficulties.  Reason for Service:  Melissa [name] is a 87 year old female referred for neuropsychological evaluation by the patient's primary care physician Luanna Cole. Baxley, MD due to reports of increasing memory difficulties.  The  patient has a past medical history including a history of benign right breast biopsy, history of COVID-19, bladder dysfunction, mixed hyperlipidemia.  The patient has significant difficulties with severe back pain and a diagnosis of radiculopathy with imaged issues related to spinal stenosis and bulging lumbar disc issues.  The patient also has a history of anxiety difficulties and depressive events as well as bereavement/grief.  The patient has been treated with Zoloft and had counseling around the time of her husband passing away.  The patient and her daughter report some increasing issues with short-term memory deficits and asking questions repeatedly as well as some word finding difficulties.  During the clinical interview today the patient reports that she has noticed more difficulties with short-term memory over the past year but reports that if she waits a minute after having difficulty recalling something that she will often remember it later.  The patient's daughter reports that the patient can be sitting around and asked the same questions multiple times.  This is almost all around new learned information versus long-term memories.  The patient reports that sometimes she has difficulty finding the words that she wants to say and difficulties with spelling and reversing letters when she is writing things down.  The patient reports that often the correct word will come to her later.  The patient denies any paraphasic type errors.  The patient denies any geographic disorientation's, tremors, hallucinations or other neurological symptoms beyond significant pain associated with spinal stenosis and bulging disc.  The patient denies any history of significant concussive events or seizures.  The patient  reports that she has been dealing with significant severe back pain due to spinal stenosis.  She reports that in the past she had spinal cord stimulator implantation but it was eventually removed due to causing  strange sensations physically.  The patient has had spinal injections in the past but they have discontinued those even though they were helpful because of the buildup of scar tissue and arthritis making it difficult to properly provide these injections.  The patient reports that she has had a cyst removed in her breast, bladder issues, torn rotator cuff, cataract removal and has had a bladder stimulator.  The patient reports that her sleep is generally okay and that she will sleep at least 8 hours at night.  However, as her pain is becoming more exacerbated her sleep is become more problematic.  The patient reports that her appetite is good.  Behavioral Observation: Jil Lecy  presents as a 87 y.o.-year-old Left handed African American Female who appeared her stated age. her dress was Appropriate and she was Well Groomed and her manners were Appropriate to the situation.  her participation was indicative of Appropriate and Redirectable behaviors.  There were physical disabilities noted.  The patient had disturbance in gait with difficulty walking reporting it was due to her pain and used a cane.  Patient reports that she does have a rolling walker but does not often use it and should use it more.  The patient was hunched over significantly and was squirming around in her chair at times reporting due to significant pain.  she displayed an appropriate level of cooperation and motivation.     Interactions:    Active Appropriate  Attention:   within normal limits and attention span and concentration were age appropriate  Memory:   abnormal; remote memory intact, recent memory impaired  Visuo-spatial:  not examined  Speech (Volume):  normal  Speech:   normal; normal  Thought Process:  Coherent and Relevant  Though Content:  WNL; not suicidal and not homicidal  Orientation:   person, place, time/date, and  situation  Judgment:   Good  Planning:   Good  Affect:    Appropriate  Mood:    Dysphoric  Insight:   Good  Intelligence:   normal  Marital Status/Living: The patient was born and raised in [place] along with 2 siblings.  No major childhood illnesses were noted.  The patient currently lives with her daughter and has been living there for 2 years.  The patient moved from [place] with her husband approximately 5 years ago but the patient's husband developed terminal cancer and passed away 2 years ago.  The patient has had difficulty coping with the loss of her husband of 60 years.  The patient has 2 adult children age 70 and 26 and lives with her daughter.  Her son lives in [place].  Current Employment: The patient is retired.  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  Substance Use:  No concerns of substance abuse are reported.  Education:   The patient graduated from [employer] with a 2.8 [employer] in sociology.  Medical History:   Past Medical History:  Diagnosis Date   Allergy    Anxiety    Bladder dysfunction    Bulging lumbar disc    Depression    Hyperlipidemia    Hypertension    Low back pain    Lumbar  radiculopathy    Spinal stenosis          Patient Active Problem List   Diagnosis Date Noted   Nonrheumatic tricuspid valve regurgitation 12/26/2020   PAD (peripheral artery disease) (HCC) 09/23/2020   Anxiety state 02/23/2018   Hyperlipidemia 02/23/2018   Essential hypertension 02/23/2018        Psychiatric History:  No significant prior psychiatric history although the patient did develop bereavement and depression type symptoms around the death of her husband.  The patient has also had increasing anxiety symptoms over the past couple of years.  Family Med/Psych History:  Family History  Problem Relation Age of Onset   Arthritis Mother    Alcohol abuse Father     Anxiety disorder Sister    Anxiety disorder Niece    Anxiety disorder Granddaughter      Impression/DX:  Melissa [name] is a 87 year old female referred for neuropsychological evaluation by the patient's primary care physician Luanna Cole. Baxley, MD due to reports of increasing memory difficulties.  The patient has a past medical history including a history of benign right breast biopsy, history of COVID-19, bladder dysfunction, mixed hyperlipidemia.  The patient has significant difficulties with severe back pain and a diagnosis of radiculopathy with imaged issues related to spinal stenosis and bulging lumbar disc issues.  The patient also has a history of anxiety difficulties and depressive events as well as bereavement/grief.  The patient has been treated with Zoloft and had counseling around the time of her husband passing away.  The patient and her daughter report some increasing issues with short-term memory deficits and asking questions repeatedly as well as some word finding difficulties.  Disposition/Plan:  We have set the patient up for formal neuropsychological testing to assess the patient's memory and cognitive functioning to aid in both diagnostic considerations as well as ongoing treatment planning and care.  The patient will complete the repeatable battery for neuropsychological assessment as well as expressive language measures.  Once these 2 measures have been completed a determination will be made as to any potential need for further objective assessment.  Once this assessment is completed a formal report will be produced and made available to the patient as well as her referring physician.  I will also sit down with the patient and her daughter and go over the formal evaluation and review any treatment recommendations derived from that evaluation.  Diagnosis:    Memory loss  Anxiety state         Electronically Signed   _______________________ Arley Phenix,  Psy.D. Clinical Neuropsychologist

## 2021-05-27 ENCOUNTER — Encounter: Payer: Medicare Other | Attending: Psychology

## 2021-05-27 ENCOUNTER — Other Ambulatory Visit: Payer: Self-pay

## 2021-05-27 DIAGNOSIS — R413 Other amnesia: Secondary | ICD-10-CM | POA: Insufficient documentation

## 2021-05-27 NOTE — Progress Notes (Signed)
   Neuropsychology Note  Aqueelah Bryars completed 60 minutes of neuropsychological testing with technician, Marica Otter, BA, under the supervision of Arley Phenix, PsyD., Clinical Neuropsychologist. The patient did not appear overtly distressed by the testing session, per behavioral observation or via self-report to the technician. Rest breaks were offered.   Clinical Decision Making: In considering the patient's current level of functioning, level of presumed impairment, nature of symptoms, emotional and behavioral responses during clinical interview, level of literacy, and observed level of motivation/effort, a battery of tests was selected by Dr. Kieth Brightly during initial consultation on 05/06/2021. This was communicated to the technician. Communication between the neuropsychologist and technician was ongoing throughout the testing session and changes were made as deemed necessary based on patient performance on testing, technician observations and additional pertinent factors such as those listed above.  Tests Administered: Controlled Oral Word Association Test (COWAT; FAS & Animals)  Repeatable Battery for the Assessment of Neuropsychological Status (RBANS); Form A  Results:  COWAT FAS Total = 11 Z = -2.32 Animals Total = 11 Z = -1.23  Results from the RBANS will be included in final report.   Feedback to Patient: Milaya Sommers will return on 10/20/2020 for an interactive feedback session with Dr. Kieth Brightly at which time her test performances, clinical impressions and treatment recommendations will be reviewed in detail. The patient understands she can contact our office should she require our assistance before this time.  60 minutes spent face-to-face with patient administering standardized tests, 30 minutes spent scoring Radiographer, therapeutic). [CPT [id number], [id number]]  Full report to follow.

## 2021-05-30 ENCOUNTER — Other Ambulatory Visit: Payer: Self-pay | Admitting: Internal Medicine

## 2021-06-03 ENCOUNTER — Other Ambulatory Visit: Payer: Self-pay

## 2021-06-03 ENCOUNTER — Ambulatory Visit
Admission: RE | Admit: 2021-06-03 | Discharge: 2021-06-03 | Disposition: A | Discharge status: Home / Self Care | Payer: Medicare Other | Source: Ambulatory Visit | Attending: Internal Medicine | Admitting: Internal Medicine

## 2021-06-03 ENCOUNTER — Ambulatory Visit: Payer: Medicare Other

## 2021-06-03 DIAGNOSIS — N644 Mastodynia: Secondary | ICD-10-CM

## 2021-06-03 DIAGNOSIS — R922 Inconclusive mammogram: Secondary | ICD-10-CM | POA: Diagnosis not present

## 2021-06-13 DIAGNOSIS — G894 Chronic pain syndrome: Secondary | ICD-10-CM | POA: Diagnosis not present

## 2021-06-13 DIAGNOSIS — Z79891 Long term (current) use of opiate analgesic: Secondary | ICD-10-CM | POA: Diagnosis not present

## 2021-06-16 ENCOUNTER — Ambulatory Visit (HOSPITAL_COMMUNITY): Payer: Medicare Other | Attending: Cardiology

## 2021-06-16 ENCOUNTER — Other Ambulatory Visit: Payer: Self-pay

## 2021-06-16 DIAGNOSIS — I361 Nonrheumatic tricuspid (valve) insufficiency: Secondary | ICD-10-CM | POA: Diagnosis not present

## 2021-06-16 DIAGNOSIS — Z23 Encounter for immunization: Secondary | ICD-10-CM | POA: Diagnosis not present

## 2021-06-16 LAB — ECHOCARDIOGRAM COMPLETE
Area-P 1/2: 4.96 cm2
S' Lateral: 1.5 cm

## 2021-06-26 NOTE — Progress Notes (Signed)
Cardiology Office Note:    Date:  06/27/2021   ID:  Melissa [name], DOB [birth year], MRN [id number]  PCP:  Margaree Mackintosh, MD   [facility]  Cardiologist:  Christell Constant, MD  Advanced Practice Provider:  No care team member to display Electrophysiologist:  None   CC: DOE and leg pain follow up  History of Present Illness:    Melissa [name] is a 87 y.o. female with a hx of HTN, HLD, Spinal stenosis who presents for evaluation 09/23/20. In interim of this visit, patient had mildly abnormal TBI's. We diagnosed her with Severe TR.  On minimal diuretics her volume status improved but repeat echo showed persistent severe TR.  Seen just [birth year] her [birth year].  Patient notes that she is doing poorly- she notes that she has had should pain in the right after COVID-19 shot.  She has also had new bilateral breast swelling.  Here physical limitations are related to her spinal stenosis.    No chest pain or pressure.  No SOB/DOE and no PND/Orthopnea.  She notes that her leg swelling has returned.  No palpitations or syncope.  She notes that she drinks lots of water because her fluid pill works really well; clarifying with family this may be ~ 2 L.    Past Medical History:  Diagnosis Date   Allergy    Anxiety    Bladder dysfunction    Bulging lumbar disc    Depression    Hyperlipidemia    Hypertension    Low back pain    Lumbar radiculopathy    Spinal stenosis     Past Surgical History:  Procedure Laterality Date   bladder dysfunction     BREAST CYST EXCISION     CATARACT EXTRACTION      Current Medications: Current Meds  Medication Sig   ALPRAZolam (XANAX) 0.5 MG tablet TAKE 1/2 TO 1 TABLET BY MOUTH EVERY MORNING AND 1/2 TO 1 TABLET AT NIGHT   atorvastatin (LIPITOR) 40 MG tablet TAKE 1 TABLET BY MOUTH EVERY DAY   cholecalciferol (VITAMIN D) 1000 units tablet Take 1,000 Units by mouth daily.   clopidogrel (PLAVIX) 75 MG tablet TAKE 1  TABLET(75 MG) BY MOUTH DAILY   furosemide (LASIX) 40 MG tablet Take 1 tablet (40 mg total) by mouth daily.   meloxicam (MOBIC) 15 MG tablet Take 1 tablet (15 mg total) by mouth daily.   mirtazapine (REMERON) 15 MG tablet TAKE 1/2-1 TABLET BY MOUTH BEFORE BEDTIME, SCHEDULE APPOINTMENT   potassium chloride SA (KLOR-CON) 20 MEQ tablet Take 1 tablet (20 mEq total) by mouth daily.   TURMERIC PO Take by mouth daily.   valsartan-hydrochlorothiazide (DIOVAN-HCT) 80-12.5 MG tablet TAKE 1 TABLET BY MOUTH EVERY DAY   [DISCONTINUED] furosemide (LASIX) 20 MG tablet Take 1 tablet (20 mg total) by mouth daily.     Allergies:   Aspirin, Aspirin, Codeine, and Tramadol   Social History   Socioeconomic History   Marital status: Widowed    Spouse name: Not on file   Number of children: Not on file   Years of education: Not on file   Highest education level: Not on file  Occupational History   Not on file  Tobacco Use   Smoking status: Never   Smokeless tobacco: Never  Vaping Use   Vaping Use: Never used  Substance and Sexual Activity   Alcohol use: Yes    Comment: Reports 1/2 glass of wine periodically   Drug use:  Never   Sexual activity: Not on file  Other Topics Concern   Not on file  Social History Narrative   ** Merged History Encounter **       Social Determinants of Health   Financial Resource Strain: Not on file  Food Insecurity: Not on file  Transportation Needs: Not on file  Physical Activity: Not on file  Stress: Not on file  Social Connections: Not on file     Family History: The patient's family history includes Alcohol abuse in her father; Anxiety disorder in her granddaughter, niece, and sister; Arthritis in her mother.  History of coronary artery disease notable for no members. History of heart failure notable for no members. History of arrhythmia notable for no members.  ROS:   Please see the history of present illness.     All other systems reviewed and are  negative.  EKGs/Labs/Other Studies Reviewed:    The following studies were reviewed today:  EKG:  09/23/20: SR rate 97, Nonspecific TWI 09/09/20: SR rate 78 with non-specific TWI  Vascular Duplex: Date: 10/17/20 Results: Right: Resting right ankle-brachial index is within normal range. No  evidence of significant right lower extremity arterial disease. The right  toe-brachial index is abnormal.   Left: Resting left ankle-brachial index is within normal range. No  evidence of significant left lower extremity arterial disease. The left  toe-brachial index is abnormal.   Transthoracic Echocardiogram: Date: 06/16/21 Results:  1. Left ventricular ejection fraction, by estimation, is 60 to 65%. The  left ventricle has normal function. The left ventricle has no regional  wall motion abnormalities. Left ventricular diastolic parameters are  indeterminate.   2. Right ventricular systolic function is normal. The right ventricular  size is mildly enlarged. There is moderately elevated pulmonary artery  systolic pressure. The estimated right ventricular systolic pressure is  45.5 mmHg.   3. The mitral valve is normal in structure. Mild mitral valve  regurgitation. No evidence of mitral stenosis.   4. Tricuspid valve regurgitation is severe.   5. The aortic valve is tricuspid. There is mild calcification of the  aortic valve. Aortic valve regurgitation is not visualized. No aortic  stenosis is present.      Recent Labs: 12/26/2020: Magnesium 2.2; NT-Pro BNP 38 02/27/2021: ALT 15; BUN 26; Creat 0.99; Hemoglobin 12.4; Platelets 206; Potassium 4.3; Sodium 138; TSH 1.10  Recent Lipid Panel    Component Value Date/Time   CHOL 182 02/27/2021 0910   TRIG 80 02/27/2021 0910   HDL 69 02/27/2021 0910   CHOLHDL 2.6 02/27/2021 0910   LDLCALC 96 02/27/2021 0910    Physical Exam:    VS:  BP 118/68   Pulse 80   Ht 7' (2.134 m)   Wt 144 lb (65.3 kg)   SpO2 100%   BMI 14.35 kg/m     Wt  Readings from Last 3 Encounters:  06/27/21 144 lb (65.3 kg)  03/03/21 141 lb (64 kg)  12/26/20 139 lb 3.2 oz (63.1 kg)    Gen: no distress, elderly frail female   Neck: JVD to lower third of neck while sitting up straight Cardiac: No Rubs or Gallops, holosystolic murmur, regular rhythm +2 radial pulses Respiratory: Clear to auscultation bilaterally, normal effort, normal  respiratory rate GI: Soft, nontender, non-distended  MS: No +1 bilateral edema;  moves all extremities Integument: Skin feels warm Neuro:  At time of evaluation, alert and oriented to person/place/time/situation  Psych: Normal affect, patient feels terrible, related to her lack  of mobility   ASSESSMENT:    1. Severe tricuspid regurgitation   2. Essential hypertension   3. PAD (peripheral artery disease) (HCC)     PLAN:    Severe TR HTN PAD - continue current meds, we will increase her lasix to 40 mg (she takes HCTZ and lasix and this will also assist in her diuersis) - BMP/BNP, Mg after Thanksgiving - Echo in 5-6 months - we discussed fluid intake and the natural history of Severe TR, may eventually be TEER candidate - continue plavix and statin  See me or APP in 5-6 months after Echo      Medication Adjustments/Labs and Tests Ordered: Current medicines are reviewed at length with the patient today.  Concerns regarding medicines are outlined above.  Orders Placed This Encounter  Procedures   Basic metabolic panel   Magnesium   Pro b natriuretic peptide (BNP)   ECHOCARDIOGRAM COMPLETE    Meds ordered this encounter  Medications   furosemide (LASIX) 40 MG tablet    Sig: Take 1 tablet (40 mg total) by mouth daily.    Dispense:  90 tablet    Refill:  3     Patient Instructions  Medication Instructions:  Your physician has recommended you make the following change in your medication:  INCREASE: furosemide (Lasix) to 40 mg by mouth daily  *If you need a refill on your cardiac medications  before your next appointment, please call your pharmacy*   Lab Work: WEEK AFTER THANKSGIVING: BMP, BNP, Mg  If you have labs (blood work) drawn today and your tests are completely normal, you will receive your results only by: MyChart Message (if you have MyChart) OR A paper copy in the mail If you have any lab test that is abnormal or we need to change your treatment, we will call you to review the results.   Testing/Procedures: Your physician has requested that you have an echocardiogram in 5-6 months. Echocardiography is a painless test that uses sound waves to create images of your heart. It provides your doctor with information about the size and shape of your heart and how well your heart's chambers and valves are working. This procedure takes approximately one hour. There are no restrictions for this procedure.    Follow-Up: At [facility], you and your health needs are our priority.  As part of our continuing mission to provide you with exceptional heart care, we have created designated Provider Care Teams.  These Care Teams include your primary Cardiologist (physician) and Advanced Practice Providers (APPs -  Physician Assistants and Nurse Practitioners) who all work together to provide you with the care you need, when you need it.   Your next appointment:   5- 6 month(s) post Echo   The format for your next appointment:   In Person  Provider:   Christell Constant, MD  or Ronie Spies, PA-C or Jacolyn Reedy, PA-C       :1}      Signed, Christell Constant, MD  06/27/2021 10:54 AM    [facility]

## 2021-06-27 ENCOUNTER — Other Ambulatory Visit: Payer: Self-pay

## 2021-06-27 ENCOUNTER — Ambulatory Visit (INDEPENDENT_AMBULATORY_CARE_PROVIDER_SITE_OTHER): Payer: Medicare Other | Admitting: Internal Medicine

## 2021-06-27 ENCOUNTER — Encounter: Payer: Self-pay | Admitting: Internal Medicine

## 2021-06-27 VITALS — BP 118/68 | HR 80 | Ht >= 80 in | Wt 144.0 lb

## 2021-06-27 DIAGNOSIS — I739 Peripheral vascular disease, unspecified: Secondary | ICD-10-CM

## 2021-06-27 DIAGNOSIS — I10 Essential (primary) hypertension: Secondary | ICD-10-CM | POA: Diagnosis not present

## 2021-06-27 DIAGNOSIS — I071 Rheumatic tricuspid insufficiency: Secondary | ICD-10-CM | POA: Diagnosis not present

## 2021-06-27 MED ORDER — FUROSEMIDE 40 MG PO TABS: 40.0000 mg | ORAL_TABLET | Freq: Every day | ORAL | 3 refills | Status: DC

## 2021-06-27 NOTE — Patient Instructions (Signed)
Medication Instructions:  Your physician has recommended you make the following change in your medication:  INCREASE: furosemide (Lasix) to 40 mg by mouth daily  *If you need a refill on your cardiac medications before your next appointment, please call your pharmacy*   Lab Work: WEEK AFTER THANKSGIVING: BMP, BNP, Mg  If you have labs (blood work) drawn today and your tests are completely normal, you will receive your results only by: MyChart Message (if you have MyChart) OR A paper copy in the mail If you have any lab test that is abnormal or we need to change your treatment, we will call you to review the results.   Testing/Procedures: Your physician has requested that you have an echocardiogram in 5-6 months. Echocardiography is a painless test that uses sound waves to create images of your heart. It provides your doctor with information about the size and shape of your heart and how well your heart's chambers and valves are working. This procedure takes approximately one hour. There are no restrictions for this procedure.    Follow-Up: At [facility], you and your health needs are our priority.  As part of our continuing mission to provide you with exceptional heart care, we have created designated Provider Care Teams.  These Care Teams include your primary Cardiologist (physician) and Advanced Practice Providers (APPs -  Physician Assistants and Nurse Practitioners) who all work together to provide you with the care you need, when you need it.   Your next appointment:   5- 6 month(s) post Echo   The format for your next appointment:   In Person  Provider:   Christell Constant, MD  or Ronie Spies, PA-C or Jacolyn Reedy, PA-C       :1}

## 2021-07-10 ENCOUNTER — Other Ambulatory Visit: Payer: Medicare Other

## 2021-07-11 DIAGNOSIS — Z20822 Contact with and (suspected) exposure to covid-19: Secondary | ICD-10-CM | POA: Diagnosis not present

## 2021-07-16 ENCOUNTER — Encounter: Payer: Medicare Other | Attending: Psychology | Admitting: Psychology

## 2021-07-16 ENCOUNTER — Other Ambulatory Visit: Payer: Self-pay

## 2021-07-16 DIAGNOSIS — F02B4 Dementia in other diseases classified elsewhere, moderate, with anxiety: Secondary | ICD-10-CM

## 2021-07-16 DIAGNOSIS — G301 Alzheimer's disease with late onset: Secondary | ICD-10-CM | POA: Diagnosis not present

## 2021-07-16 DIAGNOSIS — F411 Generalized anxiety disorder: Secondary | ICD-10-CM | POA: Diagnosis not present

## 2021-07-16 DIAGNOSIS — R413 Other amnesia: Secondary | ICD-10-CM | POA: Diagnosis not present

## 2021-07-16 NOTE — Progress Notes (Signed)
Neuropsychological Evaluation  Patient:  Melissa [name]   DOB: [birth year]  MR Number: [id number]  Location: [facility] PHYSICAL MEDICINE AND REHABILITATION [address] [place], [address] [id number] [facility] [place] [address] Dept: [phone]  Start: 8 AM End: 9 AM  Provider/Observer:     Hershal Coria PsyD  Chief Complaint:      Chief Complaint  Patient presents with   Memory Loss   Anxiety   Back Pain   Depression    Reason For Service:     Melissa [name] is an 87 year old female referred for neuropsychological evaluation by the patient's primary care physician Melissa [name]. Baxley, MD due to reports of increasing memory difficulties.  The patient has a past medical history including a history of benign right breast biopsy, history of COVID-19, bladder dysfunction, mixed hyperlipidemia.  The patient has significant difficulties with severe back pain and a diagnosis of radiculopathy with imaged issues related to spinal stenosis and bulging lumbar disc issues.  The patient also has a history of anxiety difficulties and depressive events as well as bereavement/grief.  The patient has been treated with Zoloft and had counseling around the time of her husband passing away.  The patient and her daughter report some increasing issues with short-term memory deficits and asking questions repeatedly as well as some word finding difficulties.  During the clinical interview today the patient reports that she has noticed more difficulties with short-term memory over the past year but reports that if she waits a minute after having difficulty recalling something that she will often remember it later.  The patient's daughter reports that the patient can be sitting around and asked the same questions multiple times.  This is almost all around new learned information versus long-term memories.  The patient reports that sometimes she has  difficulty finding the words that she wants to say and difficulties with spelling and reversing letters when she is writing things down.  The patient reports that often the correct word will come to her later.  The patient denies any paraphasic type errors.  The patient denies any geographic disorientation's, tremors, hallucinations or other neurological symptoms beyond significant pain associated with spinal stenosis and bulging disc.  The patient denies any history of significant concussive events or seizures.  The patient reports that she has been dealing with significant severe back pain due to spinal stenosis.  She reports that in the past she had spinal cord stimulator implantation but it was eventually removed due to causing strange sensations physically.  The patient has had spinal injections in the past but they have discontinued those even though they were helpful because of the buildup of scar tissue and arthritis making it difficult to properly provide these injections.  The patient reports that she has had a cyst removed in her breast, bladder issues, torn rotator cuff, cataract removal and has had a bladder stimulator.  The patient reports that her sleep is generally okay and that she will sleep at least 8 hours at night.  However, as her pain is becoming more exacerbated, her sleep is become more problematic.  The patient reports that her appetite is good.  Tests Administered: Controlled Oral Word Association Test (COWAT; FAS & Animals)  Repeatable Battery for the Assessment of Neuropsychological Status (RBANS); Form A  Participation Level:   Active  Participation Quality:  Appropriate      Behavioral Observation:  Melissa [name] completed 60 minutes of neuropsychological testing with  technician, Marica Otter, BA, under the supervision of Arley Phenix, PsyD., Clinical Neuropsychologist. The patient did not appear overtly distressed by the testing session, per behavioral observation  or via self-report to the technician. Rest breaks were offered.  Well Groomed, Alert, and Appropriate.   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  We will utilize a conservative estimate of her historic/premorbid intellectual and cognitive functioning to be in the high average range relative to a normative population.  It is likely that some aspects of her premorbid intellectual and cognitive functions were in the superior range but we will utilize a high average range for comparison purposes.  COWAT FAS Total = 11 Z = -2.32 Animals Total = 11 Z = -1.23  The patient was administered the controlled oral Word Association test due to the patient reporting that sometimes she has difficulty finding the words that she wants to say and some changes in verbal fluency.  The patient showed mild deficits with regard to targeted naming fluency and significant deficits with regard to phonetic fluency on these measures.  This would be consistent with the patient's reports of difficulties and changes in word finding capacity.    RBANS Update Form A Total Scale 54  The patient was administered the repeatable battery for the assessment of neuropsychological status.  She produced a total index score of 54 which is in the extremely low range relative to a normative population and suggest multiple areas of cognitive difficulties are present.  This is significantly below predicted levels based on her education occupational history and is indicative of global changes in cognitive and neuropsychological status.        RBANS Update Form A Language 2 Semantic Fluency8 Picture Naming3-9  In a reversal somewhat from her performance on the controlled oral Word Association measure she had greater difficulties with picture naming and somatic naming capacity.  However, these are well below predicted levels that she produced  a language index score of 83, which falls in the low average range relative to a normative population and I suspect that she was likely in the high average range premorbidly.        RBANS Update Form A Visuospatial/ Constructional 50 Figure Copy1 Line Orientation<=2  The patient produced a visual spatial/constructional index score of 50 which is in the extremely low range relative to a normative population.  This is a significantly impaired score.  The patient had significant and severe deficits with regard to visual constructional capacity as well as visual spatial and visual analysis capacity.  The patient displayed difficulties with processing and using visual spatial information and this level of deficits would produce severe visual impairments and attentional difficulties and ADLs.         RBANS Update Form A Attention 72 Coding2 Digit Span9  The patient produced an attentional index score of 72 which falls in the borderline range of functioning and is significantly below predicted levels relative to a normative population.  The patient did much better on measures of pure auditory encoding capacity but displayed significant difficulties with visual scanning and information processing speed.  This level of deficits for information processing speeds would have a significant impact on ADLs.        RBANS Update Form A Immediate Memory 65 List Learning6 [facility]  The patient produced an immediate memory index score of 65 which falls in  the extremely low range relative to a normative population.  The patient showed particular difficulties on story learning and did better but still impaired list learning performance.  The patient had difficulty learning complex and simple verbal information.  This level of deficits would significantly impact her ability to self medicate, manage finances and remember appointments cooking and driving type activities.        RBANS Update Form A Delayed  Memory 44 List Recall3-9 Story Recall2 Figure Recall2 List Recognition<=2  The patient produced a delayed memory index score of 44 which is severely impaired and in the extremely low range relative to normative population.  The patient had great difficulty retaining information over a period of delay.  The patient displayed adequate pure auditory encoding capacity (digit span) with significant deficits for immediate/short-term memory capacity.  However, she continued to rapidly lose information over time with significant deficits with regard to list recall, story recall and visual recall.  The patient's performance did not improve under recognition/cued recall formats.  Not only did she have difficulty with retrieval of information that had recently been presented cueing showed no improvement in her memory or accuracy.   Summary of Results:   Overall, the results of the formal neuropsychological testing are consistent with significant cognitive deficits representing a significant loss of function over estimates of premorbid functioning.  The patient showed mild deficits with regard to verbal fluency both somatic fluency, phonetic fluency and targeted naming capacity.  The patient also showed severely impaired visual-spatial and visual constructional abilities and visual reasoning and problem-solving capacity.  The patient showed generally well-preserved auditory encoding capacity with significant deficits for information processing and visual scanning/visual searching abilities.  While her pure auditory encoding was generally well-preserved she had difficulty with storage and organization of newly experienced and learned information both auditorily and visually.  She showed moderate to significant deficits on measures of short-term memory for both visual and auditory information.  After a period of delay, the patient continued to show significant loss of information and was profoundly impaired on delayed  memory functions with no significant improvement in performance under cued/recognition formats.  Impression/Diagnosis:   The results of the current neuropsychological evaluation are consistent with the development of multiple cognitive deficits.  Memory impairments, changes in expressive language capacity, significant visual-spatial and visual constructional deficits, and significant changes in executive functioning including cognitive flexibility and reasoning and problem-solving deficits.  These weaknesses are having a significant impact on her day-to-day functioning.  Therefore she would meet the criterion for diagnosis of dementia with significant loss from premorbid functioning levels.  While the patient is having some significant pain difficulties that are impacting sleep there are not any significant medical issues such as previous strokes or severe enough sleep disturbance to explain these deficits.  The patient has also been dealing with bereavement and depressive/anxiety type symptoms at the level of observed cognitive deficits could not be explained simply by depression and anxiety/pain symptoms.  There does appear to be a progressive nature to her cognitive changes.  Given the available information right now including subjective symptoms reported by patient and family, objective findings and pattern analysis of degree and types of cognitive deficits, I do think that the most appropriate diagnosis would be a late onset dementia of the Alzheimer's type.  The pattern of strengths and weaknesses is most closely aligned with significant temporal and parietal lobe dysfunction as well and some frontal lobe dysfunction.  Given her age there may also be some  microvascular ischemic changes and it may be pertinent to have the patient do an MRI to rule out the degree of small vessel disease and how much it is playing a role in her symptoms.  Recommendations:   The patient may benefit from available medications  targeted towards Alzheimer's dementia.  We will need to monitor the patient's ADLs closely as the level of memory and attentional deficits along with visual-spatial deficits would increasingly create problems managing and coping in the home by herself.  This is potentially a progressive condition and I will sit down with the patient and family and talk about strategies going forward.   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 Clinical Neuropsychologist

## 2021-07-16 NOTE — Addendum Note (Signed)
Addended by: Jama Flavors on: 07/16/2021 11:34 AM   Modules accepted: Orders

## 2021-07-16 NOTE — Progress Notes (Signed)
Results have been relayed to the patients daughter. The patients daughter verbalized understanding. No questions at this time.  Referral has been placed and she is aware.

## 2021-07-17 ENCOUNTER — Other Ambulatory Visit: Payer: Self-pay

## 2021-07-17 ENCOUNTER — Other Ambulatory Visit: Payer: Medicare Other

## 2021-07-17 DIAGNOSIS — I071 Rheumatic tricuspid insufficiency: Secondary | ICD-10-CM | POA: Diagnosis not present

## 2021-07-17 DIAGNOSIS — R2243 Localized swelling, mass and lump, lower limb, bilateral: Secondary | ICD-10-CM | POA: Diagnosis not present

## 2021-07-17 DIAGNOSIS — I361 Nonrheumatic tricuspid (valve) insufficiency: Secondary | ICD-10-CM | POA: Diagnosis not present

## 2021-07-18 LAB — BASIC METABOLIC PANEL
BUN/Creatinine Ratio: 16 (ref 12–28)
BUN: 27 mg/dL (ref 8–27)
CO2: 28 mmol/L (ref 20–29)
Calcium: 10.2 mg/dL (ref 8.7–10.3)
Chloride: 95 mmol/L — ABNORMAL LOW (ref 96–106)
Creatinine, Ser: 1.64 mg/dL — ABNORMAL HIGH (ref 0.57–1.00)
Glucose: 100 mg/dL — ABNORMAL HIGH (ref 70–99)
Potassium: 4.5 mmol/L (ref 3.5–5.2)
Sodium: 137 mmol/L (ref 134–144)
eGFR: 30 mL/min/{1.73_m2} — ABNORMAL LOW (ref >59)

## 2021-07-18 LAB — PRO B NATRIURETIC PEPTIDE: NT-Pro BNP: 68 pg/mL (ref 0–738)

## 2021-07-18 LAB — MAGNESIUM: Magnesium: 2.2 mg/dL (ref 1.6–2.3)

## 2021-07-21 ENCOUNTER — Telehealth: Payer: Self-pay

## 2021-07-21 DIAGNOSIS — I10 Essential (primary) hypertension: Secondary | ICD-10-CM

## 2021-07-21 MED ORDER — VALSARTAN 80 MG PO TABS: 80.0000 mg | ORAL_TABLET | Freq: Every day | ORAL | 3 refills | Status: DC

## 2021-07-21 NOTE — Telephone Encounter (Signed)
-----   Message from Christell Constant, MD sent at 07/20/2021  2:51 PM EST ----- Results: Incrase in Cr related to diuretic Plan: Stop HCTZ (change valsartan from Diovan combo pill) BMP in ~ 2 weeks  Christell Constant, MD

## 2021-07-21 NOTE — Telephone Encounter (Signed)
The patient has been notified of the result and verbalized understanding.  All questions (if any) were answered. Macie Burows, RN 07/21/2021 5:27 PM    Pt will check with daughter to see when she can bring her in for 2 week follow up BMET.  Daughter will send a my chart message with date for lab.

## 2021-07-29 ENCOUNTER — Other Ambulatory Visit: Payer: Self-pay | Admitting: Internal Medicine

## 2021-08-06 ENCOUNTER — Other Ambulatory Visit: Payer: Self-pay

## 2021-08-06 ENCOUNTER — Other Ambulatory Visit: Payer: Medicare Other | Admitting: *Deleted

## 2021-08-06 DIAGNOSIS — I10 Essential (primary) hypertension: Secondary | ICD-10-CM

## 2021-08-07 LAB — BASIC METABOLIC PANEL
BUN/Creatinine Ratio: 19 (ref 12–28)
BUN: 15 mg/dL (ref 8–27)
CO2: 22 mmol/L (ref 20–29)
Calcium: 9.9 mg/dL (ref 8.7–10.3)
Chloride: 108 mmol/L — ABNORMAL HIGH (ref 96–106)
Creatinine, Ser: 0.78 mg/dL (ref 0.57–1.00)
Glucose: 80 mg/dL (ref 70–99)
Potassium: 4 mmol/L (ref 3.5–5.2)
Sodium: 142 mmol/L (ref 134–144)
eGFR: 73 mL/min/{1.73_m2} (ref >59)

## 2021-08-08 DIAGNOSIS — Z23 Encounter for immunization: Secondary | ICD-10-CM | POA: Diagnosis not present

## 2021-08-18 ENCOUNTER — Telehealth: Payer: Self-pay | Admitting: Internal Medicine

## 2021-08-18 NOTE — Telephone Encounter (Signed)
Patients daughter returned call for results °

## 2021-08-18 NOTE — Telephone Encounter (Signed)
RN spoke with pt daughter in regards to medication management.  RN informed daughter that pt first expressed that she was taking furosemide 40 mg PO QD as ordered. On f/u call pt reports not taking medication. RN asked daughter for clarification.  Pt thought she was told not to take diuretic and only take Valsartan.  RN informed daughter that pt was to stop Valsartan/HCTZ combination pill and take Valsartan 80 mg PO QD and furosemide 40 mg PO QD.  Daughter expresses that she will ensure pt is taking medications as ordered. RN told daughter to call back with any questions or concerns.

## 2021-08-29 ENCOUNTER — Other Ambulatory Visit: Payer: Self-pay | Admitting: Internal Medicine

## 2021-09-01 ENCOUNTER — Other Ambulatory Visit: Payer: Self-pay

## 2021-09-01 ENCOUNTER — Other Ambulatory Visit: Payer: Medicare Other | Admitting: Internal Medicine

## 2021-09-01 DIAGNOSIS — E782 Mixed hyperlipidemia: Secondary | ICD-10-CM | POA: Diagnosis not present

## 2021-09-01 LAB — LIPID PANEL
Cholesterol: 283 mg/dL — ABNORMAL HIGH (ref <200)
HDL: 67 mg/dL (ref >=50)
LDL Cholesterol (Calc): 194 mg/dL (calc) — ABNORMAL HIGH
Non-HDL Cholesterol (Calc): 216 mg/dL (calc) — ABNORMAL HIGH (ref <130)
Total CHOL/HDL Ratio: 4.2 (calc) (ref <5.0)
Triglycerides: 100 mg/dL (ref <150)

## 2021-09-01 LAB — HEPATIC FUNCTION PANEL
AG Ratio: 1.5 (calc) (ref 1.0–2.5)
ALT: 12 U/L (ref 6–29)
AST: 18 U/L (ref 10–35)
Albumin: 4.3 g/dL (ref 3.6–5.1)
Alkaline phosphatase (APISO): 84 U/L (ref 37–153)
Bilirubin, Direct: 0.1 mg/dL (ref 0.0–0.2)
Globulin: 2.9 g/dL (calc) (ref 1.9–3.7)
Indirect Bilirubin: 0.6 mg/dL (calc) (ref 0.2–1.2)
Total Bilirubin: 0.7 mg/dL (ref 0.2–1.2)
Total Protein: 7.2 g/dL (ref 6.1–8.1)

## 2021-09-02 ENCOUNTER — Ambulatory Visit (INDEPENDENT_AMBULATORY_CARE_PROVIDER_SITE_OTHER): Payer: Medicare Other | Admitting: Internal Medicine

## 2021-09-02 ENCOUNTER — Encounter: Payer: Self-pay | Admitting: Internal Medicine

## 2021-09-02 VITALS — BP 128/78 | HR 78 | Temp 98.2°F | Resp 18 | Ht 59.0 in

## 2021-09-02 DIAGNOSIS — E782 Mixed hyperlipidemia: Secondary | ICD-10-CM

## 2021-09-02 DIAGNOSIS — F411 Generalized anxiety disorder: Secondary | ICD-10-CM

## 2021-09-02 DIAGNOSIS — F419 Anxiety disorder, unspecified: Secondary | ICD-10-CM | POA: Diagnosis not present

## 2021-09-02 DIAGNOSIS — F32A Depression, unspecified: Secondary | ICD-10-CM | POA: Diagnosis not present

## 2021-09-02 DIAGNOSIS — R413 Other amnesia: Secondary | ICD-10-CM

## 2021-09-02 DIAGNOSIS — Z8659 Personal history of other mental and behavioral disorders: Secondary | ICD-10-CM

## 2021-09-02 DIAGNOSIS — M5416 Radiculopathy, lumbar region: Secondary | ICD-10-CM | POA: Diagnosis not present

## 2021-09-02 DIAGNOSIS — I10 Essential (primary) hypertension: Secondary | ICD-10-CM

## 2021-09-02 DIAGNOSIS — M4807 Spinal stenosis, lumbosacral region: Secondary | ICD-10-CM

## 2021-09-02 DIAGNOSIS — I071 Rheumatic tricuspid insufficiency: Secondary | ICD-10-CM | POA: Diagnosis not present

## 2021-09-02 NOTE — Patient Instructions (Addendum)
Prescription given for wheelchair.  Home health PT referral made.  Continue current medications and follow-up here in 6 months for Medicare wellness and health maintenance exam.  Records were copy to give to physician at [facility] pain management regarding treatment at [facility].  Daughter to check to see if statin medication needs to be refilled

## 2021-09-02 NOTE — Progress Notes (Signed)
Subjective:    Patient ID: Melissa [name], female    DOB: 01-Jun-1931, 88 y.o.   MRN: [id number]  HPI 88 year old Female seen for 6 month recheck.  With regard to hyperlipidemia, patient says that she is taking atorvastatin but I am concerned she may be out.  Prescription runs out at the end of the month.  She cannot find her bottle today with her.  Daughter will check into this.  Total cholesterol was 283, HDL 67, triglycerides 100 and LDL cholesterol 194.  Previously during multiple appointments her lipids have always been within normal limits.  She wants to have a home physical therapist come to her house to evaluate her and help her with her back.  Has chronic lumbar spinal stenosis.  Is seeing Dr. Horald Chestnut.  Has been to [name].  They wanted to check into her records from [facility] where she had seen a pain management physician there.  I have copied those records and given them to her daughter today.  She may benefit from lumbar spinal injections.  Dr. Modesta Messing and given her of those and that seemed to help.  I am not sure if she is a candidate for surgery given her multiple medical issues and her age.  She is a candidate for oral chronic pain management  She saw the cardiologist in November after I noticed a murmur.  She was diagnosed with severe TR.  However she denies shortness of breath, dyspnea on exertion or orthopnea.  She denies chest pain.  Vascular duplex study in March 2022 was normal.  Echocardiogram had ejection fraction of 60 to 65%.  Right ventricular systolic function was normal.  No aortic stenosis present.  Moderately elevated pulmonary systolic pressure.  No wall abnormalities of the left ventricle.  Estimated right ventricular systolic pressure is 45.5 mmHg.    Review of Systems lipids are elevated. Doubt she has been taking statin     Objective:   Physical Exam  BP 128/78, pulse 78 regular respiratory rate 18 pulse oximetry 94% BMI 29.08  Skin: Warm  and dry.  Nodes none.  Neck is supple.  Chest clear to auscultation.  Cardiac exam regular rate and rhythm without ectopy.  No lower extremity pitting edema.  She is on Lasix.       Assessment & Plan:  Patient has memory loss-tends to repeat herself.  She is asking for wheelchair.  Order was written.  Advised daughter to go by [facility] medical supply and speak with them about this.  Diagnosis given his lumbar spinal stenosis for need for wheelchair  Tricuspid regurgitation-followed by cardiologist.  Cardiologist has her on Plavix.  Hyperlipidemia-does not appear that she has been taking her statin medication recently as her lipids have always been normal and are elevated today.  Recheck in 6 months.  Chronic spinal stenosis-go back to [name].  Given records to take to them regarding her treatment and [facility].  She is a candidate for oral chronic pain management if cannot be helped with epidural steroid injections.  I think we feel like she is not a surgical candidate at this point.  She is asking if PT come to her home and evaluate her.  We can put in referral for her.  Perhaps they can help her with balance, muscle strength, gait strengthening.  Has also been prescribed meloxicam.  Anxiety state-has Xanax to take in the morning and evening.  This seems to help control her anxiety level.  Also has been prescribed Zoloft.  Dependent edema treated with Lasix.  Hypertension treated with Diovan 80 mg daily.  Plan: Continue these medications and return in 6 months for Medicare wellness and health maintenance exam.

## 2021-10-16 ENCOUNTER — Ambulatory Visit: Payer: Medicare Other | Admitting: Psychology

## 2021-10-20 ENCOUNTER — Other Ambulatory Visit: Payer: Self-pay

## 2021-10-20 ENCOUNTER — Encounter: Payer: Medicare Other | Attending: Psychology | Admitting: Psychology

## 2021-10-20 DIAGNOSIS — R413 Other amnesia: Secondary | ICD-10-CM | POA: Insufficient documentation

## 2021-10-20 DIAGNOSIS — G301 Alzheimer's disease with late onset: Secondary | ICD-10-CM | POA: Insufficient documentation

## 2021-10-20 DIAGNOSIS — F02B4 Dementia in other diseases classified elsewhere, moderate, with anxiety: Secondary | ICD-10-CM | POA: Diagnosis not present

## 2021-10-20 DIAGNOSIS — F411 Generalized anxiety disorder: Secondary | ICD-10-CM | POA: Insufficient documentation

## 2021-10-21 ENCOUNTER — Encounter: Payer: Self-pay | Admitting: Psychology

## 2021-10-21 NOTE — Progress Notes (Signed)
10/20/2021 4 PM-5 PM: ? ?Today's visit was an in person visit that was conducted in my outpatient clinic office.  The patient myself were present with no other family members.  Today I provided feedback regarding the results of the recent neuropsychological evaluation.  I did provide a copy of my evaluation to the patient so she could make sure that it was available to any family members that she wanted to have the information.  The patient has seen Dr. Lenord Fellers after my report was finished and Dr. Lenord Fellers had seen and reviewed my report.  Dr. Lenord Fellers has made a referral to neurology to address possible medication strategies that may be available.  The patient's history and neuropsychological evaluation is consistent with a diagnosis of late onset dementia of the Alzheimer's type.  It is very slow progressing in very late in onset which is typical for slow progressing types of conditions.  I have included the summary and results of my formal neuropsychological evaluation below for convenience and the entire neuropsychological evaluation can be found in the patient's EMR dated 07/16/2021. ? ? ? ?Summary of Results:                        Overall, the results of the formal neuropsychological testing are consistent with significant cognitive deficits representing a significant loss of function over estimates of premorbid functioning.  The patient showed mild deficits with regard to verbal fluency both somatic fluency, phonetic fluency and targeted naming capacity.  The patient also showed severely impaired visual-spatial and visual constructional abilities and visual reasoning and problem-solving capacity.  The patient showed generally well-preserved auditory encoding capacity with significant deficits for information processing and visual scanning/visual searching abilities.  While her pure auditory encoding was generally well-preserved she had difficulty with storage and organization of newly experienced and learned  information both auditorily and visually.  She showed moderate to significant deficits on measures of short-term memory for both visual and auditory information.  After a period of delay, the patient continued to show significant loss of information and was profoundly impaired on delayed memory functions with no significant improvement in performance under cued/recognition formats. ?  ?Impression/Diagnosis:                     The results of the current neuropsychological evaluation are consistent with the development of multiple cognitive deficits.  Memory impairments, changes in expressive language capacity, significant visual-spatial and visual constructional deficits, and significant changes in executive functioning including cognitive flexibility and reasoning and problem-solving deficits.  These weaknesses are having a significant impact on her day-to-day functioning.  Therefore she would meet the criterion for diagnosis of dementia with significant loss from premorbid functioning levels.  While the patient is having some significant pain difficulties that are impacting sleep there are not any significant medical issues such as previous strokes or severe enough sleep disturbance to explain these deficits.  The patient has also been dealing with bereavement and depressive/anxiety type symptoms at the level of observed cognitive deficits could not be explained simply by depression and anxiety/pain symptoms.  There does appear to be a progressive nature to her cognitive changes. ? ?Given the available information right now including subjective symptoms reported by patient and family, objective findings and pattern analysis of degree and types of cognitive deficits, I do think that the most appropriate diagnosis would be a late onset dementia of the Alzheimer's type.  The pattern of strengths and weaknesses  is most closely aligned with significant temporal and parietal lobe dysfunction as well and some frontal  lobe dysfunction.  Given her age there may also be some microvascular ischemic changes and it may be pertinent to have the patient do an MRI to rule out the degree of small vessel disease and how much it is playing a role in her symptoms. ?  ?Recommendations:                          The patient may benefit from available medications targeted towards Alzheimer's dementia.  We will need to monitor the patient's ADLs closely as the level of memory and attentional deficits along with visual-spatial deficits would increasingly create problems managing and coping in the home by herself.  This is potentially a progressive condition and I will sit down with the patient and family and talk about strategies going forward. ?  ?  ?Diagnosis:                              ?  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  ?Memory loss ?  ?Anxiety state ?  ?  ?Arley Phenix, Psy.D. ?Clinical Neuropsychologist ?

## 2021-11-14 ENCOUNTER — Other Ambulatory Visit: Payer: Self-pay | Admitting: Internal Medicine

## 2021-11-20 ENCOUNTER — Other Ambulatory Visit: Payer: Self-pay | Admitting: Internal Medicine

## 2021-11-21 ENCOUNTER — Telehealth: Payer: Self-pay | Admitting: Internal Medicine

## 2021-11-21 NOTE — Telephone Encounter (Signed)
Called pt back in regards to leg swelling. Pt reports left leg swelling greater than right.  Has eaten processed meats over the last 2 days.  Does not elevate legs or monitor fluid intake.  Pt taking furosemide 20 mg PO QD.  Advised pt that medication list furosemide 40 mg PO QD ordered.  Pt reviewed medications and has 40 mg tablets but only takes 20 mg.  Advised to take an extra dose of furosemide 20 mg today.  Then start taking furosemide 40 mg PO QD.  Check daily weights and call in on Tuesday 11/25/21 to update me on leg swelling and weights. Pt is agreeable to plan. ?

## 2021-11-21 NOTE — Telephone Encounter (Signed)
?  Pt c/o swelling: STAT is pt has developed SOB within 24 hours ? ?If swelling, where is the swelling located? Foot and ankle, one leg more than the other ? ?How much weight have you gained and in what time span? 8 lbs, couple of weeks ? ?Have you gained 3 pounds in a day or 5 pounds in a week? Not sure ? ?Do you have a log of your daily weights (if so, list)? no ? ?Are you currently taking a fluid pill? yes ? ?Are you currently SOB? Not right now but it comes and goes, started this past week ? ?Have you traveled recently? no  ?

## 2021-11-27 NOTE — Telephone Encounter (Signed)
Called pt to f/u leg swelling.  Pt reports leg swelling has improved since started taking correct dose of furosemide.  Advised pt to call back if needed.   ?

## 2021-12-11 ENCOUNTER — Ambulatory Visit (HOSPITAL_COMMUNITY): Payer: Medicare Other | Attending: Cardiology

## 2021-12-11 DIAGNOSIS — I071 Rheumatic tricuspid insufficiency: Secondary | ICD-10-CM | POA: Insufficient documentation

## 2021-12-11 LAB — ECHOCARDIOGRAM COMPLETE
Area-P 1/2: 5.97 cm2
S' Lateral: 2 cm

## 2021-12-20 NOTE — Progress Notes (Signed)
?Cardiology Office Note:   ? ?Date:  12/22/2021  ? ?ID:  Melissa [name], DOB 10/01/1930, MRN [id number] ? ?PCP:  Margaree Mackintosh, MD ?  ? [facility]  ?Cardiologist:  Christell Constant, MD  ?Advanced Practice Provider:  No care team member to display ?Electrophysiologist:  None  ? ?CC: LE follow up ? ?History of Present Illness:   ? ?Melissa [name] is a 88 y.o. female with a hx of HTN, HLD, Spinal stenosis who presents for evaluation 09/23/20.  ?2022: Patient had mildly abnormal TBI's, Severe TR.  On minimal diuretics her volume status improved but repeat echo showed persistent severe TR.   ?2023: Called in; she stopped her lasix inadverently and had leg swelling.  Started diuretic and this improved.  Echo shows improved TR ? ?Patient notes that she is doing poorly: notes she is in a lot of pain. ?Notes that she has a bigger bottom than before. ?Notes hand numbness. ?Notes that years ago had a bladder stimulatory placed ago.  She feels likes she is having pain from this.  This was placed by a urologist in [place]. ?Notes foot numbness ?Notes that she cannot walk so much because of pain and spinal stenosis.   ?   ?There are no interval hospital/ED visit.   ? ?No chest pain or pressure .  Notes sternal sornesss after mammogram; she thinks this was caused by COVID-19 vaccination. Notes DOE.  No weight gain, leg swelling is worse.  No palpitations or syncope.   ? ?Past Medical History:  ?Diagnosis Date  ? Allergy   ? Anxiety   ? Bladder dysfunction   ? Bulging lumbar disc   ? Depression   ? Hyperlipidemia   ? Hypertension   ? Low back pain   ? Lumbar radiculopathy   ? Spinal stenosis   ? ? ?Past Surgical History:  ?Procedure Laterality Date  ? bladder dysfunction    ? BREAST CYST EXCISION    ? CATARACT EXTRACTION    ? ? ?Current Medications: ?Current Meds  ?Medication Sig  ? ALPRAZolam (XANAX) 0.5 MG tablet TAKE 1/2 TO 1 TABLET BY MOUTH EVERY MORNING AT NIGHT (Patient taking differently:  TAKE 1/2 TO 1 TABLET BY MOUTH EVERY NIGHT)  ? atorvastatin (LIPITOR) 40 MG tablet TAKE 1 TABLET BY MOUTH EVERY DAY  ? cholecalciferol (VITAMIN D) 1000 units tablet Take 1,000 Units by mouth daily.  ? cilostazol (PLETAL) 100 MG tablet Take 1 tablet (100 mg total) by mouth 2 (two) times daily.  ? clopidogrel (PLAVIX) 75 MG tablet TAKE 1 TABLET(75 MG) BY MOUTH DAILY  ? furosemide (LASIX) 80 MG tablet Take 1 tablet (80 mg total) by mouth daily.  ? mirtazapine (REMERON) 15 MG tablet TAKE 1/2-1 TABLET BY MOUTH BEFORE BEDTIME, SCHEDULE APPOINTMENT  ? potassium chloride SA (KLOR-CON M) 20 MEQ tablet Take 2 tablets (40 mEq total) by mouth daily.  ? sertraline (ZOLOFT) 100 MG tablet TAKE 1 TABLET(100 MG) BY MOUTH DAILY  ? TURMERIC PO Take by mouth daily.  ? valsartan (DIOVAN) 80 MG tablet Take 1 tablet (80 mg total) by mouth daily.  ? [DISCONTINUED] furosemide (LASIX) 40 MG tablet Take 1 tablet (40 mg total) by mouth daily.  ? [DISCONTINUED] meloxicam (MOBIC) 15 MG tablet Take 1 tablet (15 mg total) by mouth daily.  ? [DISCONTINUED] potassium chloride SA (KLOR-CON M) 20 MEQ tablet TAKE 1 TABLET(20 MEQ) BY MOUTH DAILY  ?  ? ?Allergies:   Aspirin, Aspirin, Codeine, and Tramadol  ? ?  Social History  ? ?Socioeconomic History  ? Marital status: Widowed  ?  Spouse name: Not on file  ? Number of children: Not on file  ? Years of education: Not on file  ? Highest education level: Not on file  ?Occupational History  ? Not on file  ?Tobacco Use  ? Smoking status: Never  ? Smokeless tobacco: Never  ?Vaping Use  ? Vaping Use: Never used  ?Substance and Sexual Activity  ? Alcohol use: Yes  ?  Comment: Reports 1/2 glass of wine periodically  ? Drug use: Never  ? Sexual activity: Not on file  ?Other Topics Concern  ? Not on file  ?Social History Narrative  ? ** Merged History Encounter **  ?    ? ?Social Determinants of Health  ? ?Financial Resource Strain: Not on file  ?Food Insecurity: Not on file  ?Transportation Needs: Not on file   ?Physical Activity: Not on file  ?Stress: Not on file  ?Social Connections: Not on file  ?  ? ?Family History: ?The patient's family history includes Alcohol abuse in her father; Anxiety disorder in her granddaughter, niece, and sister; Arthritis in her mother.  ?History of coronary artery disease notable for no members. ?History of heart failure notable for no members. ?History of arrhythmia notable for no members. ? ?ROS:   ?Please see the history of present illness.    ? All other systems reviewed and are negative. ? ?EKGs/Labs/Other Studies Reviewed:   ? ?The following studies were reviewed today: ? ?EKG:  ?12/22/21: SR inferior 79 TWI ?09/23/20: SR rate 97, Nonspecific TWI ?09/09/20: SR rate 78 with non-specific TWI ? ?Vascular Duplex: ?Date: 10/17/20 ?Results: ?Right: Resting right ankle-brachial index is within normal range. No  ?evidence of significant right lower extremity arterial disease. The right  ?toe-brachial index is abnormal.  ? ?Left: Resting left ankle-brachial index is within normal range. No  ?evidence of significant left lower extremity arterial disease. The left  ?toe-brachial index is abnormal.  ? ?Transthoracic Echocardiogram: ?Date: 12/11/21 ?Results: ? 1. Left ventricular ejection fraction, by estimation, is 65 to 70%. The  ?left ventricle has normal function. The left ventricle has no regional  ?wall motion abnormalities. Left ventricular diastolic parameters are  ?consistent with Grade I diastolic  ?dysfunction (impaired relaxation).  ? 2. Right ventricular systolic function is normal. The right ventricular  ?size is mildly enlarged. There is normal pulmonary artery systolic  ?pressure.  ? 3. The mitral valve is normal in structure. No evidence of mitral valve  ?regurgitation. No evidence of mitral stenosis.  ? 4. Tricuspid valve regurgitation is mild to moderate.  ? 5. The aortic valve is tricuspid. Aortic valve regurgitation is not  ?visualized. Aortic valve sclerosis/calcification is  present, without any  ?evidence of aortic stenosis.  ? 6. The inferior vena cava is normal in size with greater than 50%  ?respiratory variability, suggesting right atrial pressure of 3 mmHg.  ? ? ?Recent Labs: ?02/27/2021: Hemoglobin 12.4; Platelets 206; TSH 1.10 ?07/17/2021: Magnesium 2.2; NT-Pro BNP 68 ?08/06/2021: BUN 15; Creatinine, Ser 0.78; Potassium 4.0; Sodium 142 ?09/01/2021: ALT 12  ?Recent Lipid Panel ?   ?Component Value Date/Time  ? CHOL 283 (H) 09/01/2021 6834  ? TRIG 100 09/01/2021 0924  ? HDL 67 09/01/2021 0924  ? CHOLHDL 4.2 09/01/2021 0924  ? LDLCALC 194 (H) 09/01/2021 1962  ? ? ?Physical Exam:   ? ?VS:  BP 102/68   Pulse 79   Ht 4\' 11"  (1.499 m)  Wt 143 lb (64.9 kg)   SpO2 98%   BMI 28.88 kg/m?    ? ?Wt Readings from Last 3 Encounters:  ?12/22/21 143 lb (64.9 kg)  ?06/27/21 144 lb (65.3 kg)  ?03/03/21 141 lb (64 kg)  ? ?Gen: no distress ?Neck: JVD but with low V wave nadir ?Cardiac: No Rubs or Gallops, holosystolc 3/6 Murmur, RRR , +2 radial pulses ?Respiratory: Clear to auscultation bilaterally, normal effort, normal  respiratory rate ?GI: Soft, nontender, non-distended  ?MS: +1 bilateral legs (L > R, R ankle) edema;  moves all extremities ?Integument: Skin feels warm ?Neuro:  At time of evaluation, alert and oriented to person/place/time/situation  ?Psych: Normal affect, patient feels poorly ? ? ?ASSESSMENT:   ? ?1. Severe tricuspid regurgitation   ?2. PAD (peripheral artery disease) (HCC)   ?3. Hyperlipidemia, unspecified hyperlipidemia type   ? ? ? ?PLAN:   ? ?Moderate TR with LE swelling ?DOE ?HTN ?- some of her DOE is related to deconditioning- ?- given her limited mobility, will increase lasix to 80 mg PO daily, increase K to 40 meq ?- and will get BMP in one week ?-will get Echo in 5-6 months ?- we have reviewed T-TEER procedure, if she is symptomatic with severe TR, I will plan for TEE for TEER assessment ?- discussed AMB BP checks, if low BP will decrease valsartan  ? ? ?PAD   ?HLD ?Spinal stenosis ?- plavix 75 mg PO daily ?- atorvastatin 40 was restarted at last check (LDL 180s) ?- will recheck at next lab risit in one week ?- I do not think he sx are related to PAD, but given her hx of treatment

## 2021-12-22 ENCOUNTER — Encounter: Payer: Self-pay | Admitting: Internal Medicine

## 2021-12-22 ENCOUNTER — Ambulatory Visit (INDEPENDENT_AMBULATORY_CARE_PROVIDER_SITE_OTHER): Payer: Medicare Other | Admitting: Internal Medicine

## 2021-12-22 VITALS — BP 102/68 | HR 79 | Ht 59.0 in | Wt 143.0 lb

## 2021-12-22 DIAGNOSIS — E785 Hyperlipidemia, unspecified: Secondary | ICD-10-CM

## 2021-12-22 DIAGNOSIS — I361 Nonrheumatic tricuspid (valve) insufficiency: Secondary | ICD-10-CM

## 2021-12-22 DIAGNOSIS — I739 Peripheral vascular disease, unspecified: Secondary | ICD-10-CM

## 2021-12-22 DIAGNOSIS — I10 Essential (primary) hypertension: Secondary | ICD-10-CM

## 2021-12-22 DIAGNOSIS — I071 Rheumatic tricuspid insufficiency: Secondary | ICD-10-CM

## 2021-12-22 MED ORDER — CILOSTAZOL 100 MG PO TABS: 100.0000 mg | ORAL_TABLET | Freq: Two times a day (BID) | ORAL | 3 refills | Status: DC

## 2021-12-22 MED ORDER — POTASSIUM CHLORIDE CRYS ER 20 MEQ PO TBCR: 40.0000 meq | EXTENDED_RELEASE_TABLET | Freq: Every day | ORAL | 3 refills | Status: DC

## 2021-12-22 MED ORDER — FUROSEMIDE 80 MG PO TABS: 80.0000 mg | ORAL_TABLET | Freq: Every day | ORAL | 3 refills | Status: DC

## 2021-12-22 NOTE — Patient Instructions (Signed)
Medication Instructions:  ?Your physician has recommended you make the following change in your medication:  ?INCREASE: furosemide (Lasix) to 80 mg by mouth once daily ?INCREASE: Potassium to 40 mEq by mouth once daily ? ?START: cilostazol 100 mg by mouth twice daily ? ?*If you need a refill on your cardiac medications before your next appointment, please call your pharmacy* ? ? ?Lab Work: ?IN 1 WEEK: BMP, FLP ?If you have labs (blood work) drawn today and your tests are completely normal, you will receive your results only by: ?MyChart Message (if you have MyChart) OR ?A paper copy in the mail ?If you have any lab test that is abnormal or we need to change your treatment, we will call you to review the results. ? ? ?Testing/Procedures: ?Nov 2023- Your physician has requested that you have an echocardiogram. Echocardiography is a painless test that uses sound waves to create images of your heart. It provides your doctor with information about the size and shape of your heart and how well your heart?s chambers and valves are working. This procedure takes approximately one hour. There are no restrictions for this procedure. ? ? ? ?Follow-Up: ?At [facility], you and your health needs are our priority.  As part of our continuing mission to provide you with exceptional heart care, we have created designated Provider Care Teams.  These Care Teams include your primary Cardiologist (physician) and Advanced Practice Providers (APPs -  Physician Assistants and Nurse Practitioners) who all work together to provide you with the care you need, when you need it. ? ?Your next appointment:   ?6 month(s) ? ?The format for your next appointment:   ?In Person ? ?Provider:   ?Christell Constant, MD  or Ronie Spies, PA-C, Jacolyn Reedy, PA-C, or Eligha Bridegroom, NP      ?  ? ?Important Information About Sugar ? ? ? ? ?  ?

## 2021-12-29 ENCOUNTER — Other Ambulatory Visit: Payer: Medicare Other | Admitting: *Deleted

## 2021-12-29 DIAGNOSIS — E785 Hyperlipidemia, unspecified: Secondary | ICD-10-CM | POA: Diagnosis not present

## 2021-12-29 DIAGNOSIS — I071 Rheumatic tricuspid insufficiency: Secondary | ICD-10-CM

## 2021-12-29 DIAGNOSIS — I739 Peripheral vascular disease, unspecified: Secondary | ICD-10-CM | POA: Diagnosis not present

## 2021-12-29 LAB — LIPID PANEL
Chol/HDL Ratio: 2.6 ratio (ref 0.0–4.4)
Cholesterol, Total: 161 mg/dL (ref 100–199)
HDL: 61 mg/dL (ref >39)
LDL Chol Calc (NIH): 85 mg/dL (ref 0–99)
Triglycerides: 77 mg/dL (ref 0–149)
VLDL Cholesterol Cal: 15 mg/dL (ref 5–40)

## 2021-12-29 LAB — BASIC METABOLIC PANEL
BUN/Creatinine Ratio: 24 (ref 12–28)
BUN: 41 mg/dL — ABNORMAL HIGH (ref 8–27)
CO2: 25 mmol/L (ref 20–29)
Calcium: 10.1 mg/dL (ref 8.7–10.3)
Chloride: 99 mmol/L (ref 96–106)
Creatinine, Ser: 1.7 mg/dL — ABNORMAL HIGH (ref 0.57–1.00)
Glucose: 97 mg/dL (ref 70–99)
Potassium: 4.2 mmol/L (ref 3.5–5.2)
Sodium: 136 mmol/L (ref 134–144)
eGFR: 29 mL/min/{1.73_m2} — ABNORMAL LOW (ref >59)

## 2021-12-31 ENCOUNTER — Telehealth: Payer: Self-pay

## 2021-12-31 DIAGNOSIS — I10 Essential (primary) hypertension: Secondary | ICD-10-CM

## 2021-12-31 DIAGNOSIS — I739 Peripheral vascular disease, unspecified: Secondary | ICD-10-CM

## 2021-12-31 DIAGNOSIS — E785 Hyperlipidemia, unspecified: Secondary | ICD-10-CM

## 2021-12-31 MED ORDER — ATORVASTATIN CALCIUM 80 MG PO TABS: 80.0000 mg | ORAL_TABLET | Freq: Every day | ORAL | 3 refills | Status: DC

## 2021-12-31 NOTE — Telephone Encounter (Signed)
The patient has been notified of the result and verbalized understanding.  All questions (if any) were answered. Macie Burows, RN 12/31/2021 10:40 AM   Scheduled f/u labs for 03/16/22 mailed an appointment reminder per pt request.   Advised pt if develops muscle aches to decrease atorvastatin back to 40 mg PO QD and to call the office to inform us.   Mailed pt an updated medication list along with appointment reminder.

## 2021-12-31 NOTE — Telephone Encounter (Signed)
-----   Message from Christell Constant, MD sent at 12/30/2021  8:37 AM EDT ----- Results: LDL above goal Creatinine is elevated again Plan: Creatinine elevation occurs when on diuretic, but if we stop this therapy patient's symptoms will return Continue current medications except increase atorvastatin to 80 mg PO daily If myalgias will return to old dose CMP and lipids in 2 months  Christell Constant, MD

## 2022-01-06 ENCOUNTER — Ambulatory Visit (INDEPENDENT_AMBULATORY_CARE_PROVIDER_SITE_OTHER): Payer: Medicare Other | Admitting: Diagnostic Neuroimaging

## 2022-01-06 ENCOUNTER — Other Ambulatory Visit: Payer: Self-pay | Admitting: Internal Medicine

## 2022-01-06 ENCOUNTER — Encounter: Payer: Self-pay | Admitting: Diagnostic Neuroimaging

## 2022-01-06 VITALS — BP 113/73 | HR 85 | Ht 59.0 in | Wt 141.0 lb

## 2022-01-06 DIAGNOSIS — R413 Other amnesia: Secondary | ICD-10-CM | POA: Diagnosis not present

## 2022-01-06 MED ORDER — MIRTAZAPINE 15 MG PO TABS: ORAL_TABLET | ORAL | 1 refills | Status: DC

## 2022-01-06 NOTE — Patient Instructions (Signed)
  MEMORY LOSS (mild dementia; comorbid depression, anxiety, chronic pain) - check CT head, TSH, B12 - consider memantine 10mg  at bedtime; increase to twice a day after 1-2 weeks - continue sertraline, mirtazapine, alprazolam (per PCP) - safety / supervision issues reviewed - daily physical activity / exercise (at least 15-30 minutes) - eat more plants / vegetables - increase social activities, brain stimulation, games, puzzles, hobbies, crafts, arts, music - aim for at least 7-8 hours sleep per night (or more) - avoid smoking and alcohol - caregiver resources provided - caution with medications, finances; no driving

## 2022-01-06 NOTE — Progress Notes (Signed)
[facility]  PATIENT: Melissa [name] DOB: 08-06-1931  REFERRING CLINICIAN: Margaree MackintoshBaxley, [name], MD HISTORY FROM: patient  REASON FOR VISIT: new consult   HISTORICAL  CHIEF COMPLAINT:  Chief Complaint  Patient presents with   Alzheimers Dementia    Rm 6 New Pt   dgtrAlvino Chapel- [place]  MMSE 21    HISTORY OF PRESENT ILLNESS:   88 year old female here for evaluation of memory loss.  Patient has had gradual onset progressive short-term memory loss and cognitive decline over the last 3 to 4 years.  Patient's husband passed away 3 years ago.  He had been handling most of the home chores and finances at the time.  After he passed away patient tried to do these but was unable to learn how to do it.  Now patient living with daughter.  She also is chronic pain related to lumbar spinal stenosis.  Also has anxiety and depression, mainly related to current situation.  Had neuropsychological testing which was consistent with mild neurodegenerative dementia.    REVIEW OF SYSTEMS: Full 14 system review of systems performed and negative with exception of: as per HPI.  ALLERGIES: Allergies  Allergen Reactions   Aspirin Other (See Comments)    Nausea, dizziness, malaise.   Aspirin Nausea Only   Codeine    Tramadol     HOME MEDICATIONS: Outpatient Medications Prior to Visit  Medication Sig Dispense Refill   ALPRAZolam (XANAX) 0.5 MG tablet TAKE 1/2 TO 1 TABLET BY MOUTH EVERY MORNING AT NIGHT (Patient taking differently: TAKE 1/2 TO 1 TABLET BY MOUTH EVERY NIGHT) 60 tablet 5   atorvastatin (LIPITOR) 80 MG tablet Take 1 tablet (80 mg total) by mouth daily. 90 tablet 3   cholecalciferol (VITAMIN D) 1000 units tablet Take 1,000 Units by mouth daily.     cilostazol (PLETAL) 100 MG tablet Take 1 tablet (100 mg total) by mouth 2 (two) times daily. 180 tablet 3   clopidogrel (PLAVIX) 75 MG tablet TAKE 1 TABLET(75 MG) BY MOUTH DAILY 90 tablet 2   furosemide (LASIX) 80 MG tablet Take 1 tablet  (80 mg total) by mouth daily. 90 tablet 3   mirtazapine (REMERON) 15 MG tablet TAKE 1/2-1 TABLET BY MOUTH BEFORE BEDTIME, SCHEDULE APPOINTMENT 90 tablet 0   potassium chloride SA (KLOR-CON M) 20 MEQ tablet Take 2 tablets (40 mEq total) by mouth daily. 180 tablet 3   sertraline (ZOLOFT) 100 MG tablet TAKE 1 TABLET(100 MG) BY MOUTH DAILY 90 tablet 0   TURMERIC PO Take by mouth daily.     valsartan (DIOVAN) 80 MG tablet Take 1 tablet (80 mg total) by mouth daily. 90 tablet 3   No facility-administered medications prior to visit.    PAST MEDICAL HISTORY: Past Medical History:  Diagnosis Date   Allergy    Alzheimer disease (HCC)    Anxiety    Bladder dysfunction    Bulging lumbar disc    Depression    Hyperlipidemia    Hypertension    Low back pain    Lumbar radiculopathy    Spinal stenosis     PAST SURGICAL HISTORY: Past Surgical History:  Procedure Laterality Date   bladder dysfunction     bladder stimulator     BREAST CYST EXCISION     CATARACT EXTRACTION      FAMILY HISTORY: Family History  Problem Relation Age of Onset   Arthritis Mother    Alcohol abuse Father    Anxiety disorder Sister  Anxiety disorder Granddaughter    Anxiety disorder Niece     SOCIAL HISTORY: Social History   Socioeconomic History   Marital status: Widowed    Spouse name: Not on file   Number of children: 2   Years of education: Not on file   Highest education level: Bachelor's degree (e.g., BA, AB, BS)  Occupational History   Not on file  Tobacco Use   Smoking status: Never   Smokeless tobacco: Never  Vaping Use   Vaping Use: Never used  Substance and Sexual Activity   Alcohol use: Not Currently    Comment: Reports 1/2 glass of wine periodically   Drug use: Never   Sexual activity: Not on file  Other Topics Concern   Not on file  Social History Narrative   01/06/22 lives with [name]   Social Determinants of Health   Financial Resource Strain: Not on file  Food  Insecurity: Not on file  Transportation Needs: Not on file  Physical Activity: Not on file  Stress: Not on file  Social Connections: Not on file  Intimate Partner Violence: Not on file     PHYSICAL EXAM  GENERAL EXAM/CONSTITUTIONAL: Vitals:  Vitals:   01/06/22 1122  BP: 113/73  Pulse: 85  Weight: 141 lb (64 kg)  Height: 4\' 11"  (1.499 m)   Body mass index is 28.48 kg/m. Wt Readings from Last 3 Encounters:  01/06/22 141 lb (64 kg)  12/22/21 143 lb (64.9 kg)  06/27/21 144 lb (65.3 kg)   Patient is in no distress; well developed, nourished and groomed; neck is supple  CARDIOVASCULAR: Examination of carotid arteries is normal; no carotid bruits Regular rate and rhythm, no murmurs Examination of peripheral vascular system by observation and palpation is normal  EYES: Ophthalmoscopic exam of optic discs and posterior segments is normal; no papilledema or hemorrhages No results found.  MUSCULOSKELETAL: Gait, strength, tone, movements noted in Neurologic exam below  NEUROLOGIC: MENTAL STATUS:     01/06/2022   11:30 AM  MMSE - Mini Mental State Exam  Orientation to time 5  Orientation to Place 4  Registration 3  Attention/ Calculation 0  Recall 2  Language- name 2 objects 2  Language- repeat 0  Language- follow 3 step command 3  Language- read & follow direction 1  Write a sentence 1  Copy design 0  Total score 21   awake, alert, oriented to person, place and time recent and remote memory intact normal attention and concentration language fluent, comprehension intact, naming intact fund of knowledge appropriate  CRANIAL NERVE:  2nd - no papilledema on fundoscopic exam 2nd, 3rd, 4th, 6th - pupils equal and reactive to light, visual fields full to confrontation, extraocular muscles intact, no nystagmus 5th - facial sensation symmetric 7th - facial strength symmetric 8th - hearing intact 9th - palate elevates symmetrically, uvula midline 11th - shoulder  shrug symmetric 12th - tongue protrusion midline  MOTOR:  normal bulk and tone, full strength in the BUE, BLE  SENSORY:  normal and symmetric to light touch, temperature, vibration  COORDINATION:  finger-nose-finger, fine finger movements normal  REFLEXES:  deep tendon reflexes TRACE and symmetric  GAIT/STATION:  IN WHEELCHAIR     DIAGNOSTIC DATA (LABS, IMAGING, TESTING) - I reviewed patient records, labs, notes, testing and imaging myself where available.  Lab Results  Component Value Date   WBC 3.7 (L) 02/27/2021   HGB 12.4 02/27/2021   HCT 37.2 02/27/2021   MCV 85.1 02/27/2021  PLT 206 02/27/2021      Component Value Date/Time   NA 136 12/29/2021 1035   K 4.2 12/29/2021 1035   CL 99 12/29/2021 1035   CO2 25 12/29/2021 1035   GLUCOSE 97 12/29/2021 1035   GLUCOSE 93 02/27/2021 0910   BUN 41 (H) 12/29/2021 1035   CREATININE 1.70 (H) 12/29/2021 1035   CREATININE 0.99 (H) 02/27/2021 0910   CALCIUM 10.1 12/29/2021 1035   PROT 7.2 09/01/2021 0924   AST 18 09/01/2021 0924   ALT 12 09/01/2021 0924   BILITOT 0.7 09/01/2021 0924   GFRNONAA 65 02/27/2020 0918   GFRAA 75 02/27/2020 0918   Lab Results  Component Value Date   CHOL 161 12/29/2021   HDL 61 12/29/2021   LDLCALC 85 12/29/2021   TRIG 77 12/29/2021   CHOLHDL 2.6 12/29/2021   No results found for: HGBA1C No results found for: VITAMINB12 Lab Results  Component Value Date   TSH 1.10 02/27/2021       ASSESSMENT AND PLAN  88 y.o. year old female here with onset of short-term memory loss, cognitive decline, since 2020.   Dx:  1. Memory loss       PLAN:  MEMORY LOSS (mild dementia; decline in ADLs; comorbid depression, anxiety, chronic pain) - check CT head, TSH, B12 - consider memantine 10mg  at bedtime; increase to twice a day after 1-2 weeks (limited benefit considering age and mild symptoms and co-morbid factors) - continue sertraline, mirtazapine, alprazolam (per PCP) - safety /  supervision issues reviewed - daily physical activity / exercise (at least 15-30 minutes) - eat more plants / vegetables - increase social activities, brain stimulation, games, puzzles, hobbies, crafts, arts, music - aim for at least 7-8 hours sleep per night (or more) - avoid smoking and alcohol - caregiver resources provided - caution with medications, finances; no driving  Orders Placed This Encounter  Procedures   CT HEAD WO CONTRAST ( )   Vitamin B12   TSH   Return for return to PCP.    , MD 01/06/2022, 11:58 AM Certified in Neurology, Neurophysiology and Neuroimaging  [facility] [address], [address] [place], [place] [phone]

## 2022-01-07 ENCOUNTER — Telehealth: Payer: Self-pay | Admitting: Diagnostic Neuroimaging

## 2022-01-07 NOTE — Telephone Encounter (Signed)
medicare/BCBS NPR sent to [facility] they will call the patient to schedule

## 2022-01-08 ENCOUNTER — Other Ambulatory Visit (INDEPENDENT_AMBULATORY_CARE_PROVIDER_SITE_OTHER): Payer: Self-pay

## 2022-01-08 DIAGNOSIS — R413 Other amnesia: Secondary | ICD-10-CM | POA: Diagnosis not present

## 2022-01-08 DIAGNOSIS — Z0289 Encounter for other administrative examinations: Secondary | ICD-10-CM

## 2022-01-09 LAB — TSH: TSH: 2.14 u[IU]/mL (ref 0.450–4.500)

## 2022-01-09 LAB — VITAMIN B12: Vitamin B-12: 1566 pg/mL — ABNORMAL HIGH (ref 232–1245)

## 2022-02-03 ENCOUNTER — Ambulatory Visit
Admission: RE | Admit: 2022-02-03 | Discharge: 2022-02-03 | Disposition: A | Discharge status: Home / Self Care | Payer: Medicare Other | Source: Ambulatory Visit | Attending: Diagnostic Neuroimaging | Admitting: Diagnostic Neuroimaging

## 2022-02-03 DIAGNOSIS — R413 Other amnesia: Secondary | ICD-10-CM

## 2022-02-13 ENCOUNTER — Other Ambulatory Visit: Payer: Self-pay | Admitting: Internal Medicine

## 2022-03-02 ENCOUNTER — Other Ambulatory Visit: Payer: Medicare Other

## 2022-03-02 DIAGNOSIS — R5383 Other fatigue: Secondary | ICD-10-CM

## 2022-03-02 DIAGNOSIS — I10 Essential (primary) hypertension: Secondary | ICD-10-CM

## 2022-03-02 DIAGNOSIS — E782 Mixed hyperlipidemia: Secondary | ICD-10-CM | POA: Diagnosis not present

## 2022-03-03 LAB — CBC WITH DIFFERENTIAL/PLATELET
Absolute Monocytes: 484 cells/uL (ref 200–950)
Basophils Absolute: 39 cells/uL (ref 0–200)
Basophils Relative: 1 %
Eosinophils Absolute: 312 cells/uL (ref 15–500)
Eosinophils Relative: 8 %
HCT: 34.8 % — ABNORMAL LOW (ref 35.0–45.0)
Hemoglobin: 11.5 g/dL — ABNORMAL LOW (ref 11.7–15.5)
Lymphs Abs: 1100 cells/uL (ref 850–3900)
MCH: 27.3 pg (ref 27.0–33.0)
MCHC: 33 g/dL (ref 32.0–36.0)
MCV: 82.7 fL (ref 80.0–100.0)
MPV: 11.6 fL (ref 7.5–12.5)
Monocytes Relative: 12.4 %
Neutro Abs: 1966 cells/uL (ref 1500–7800)
Neutrophils Relative %: 50.4 %
Platelets: 213 10*3/uL (ref 140–400)
RBC: 4.21 10*6/uL (ref 3.80–5.10)
RDW: 13.6 % (ref 11.0–15.0)
Total Lymphocyte: 28.2 %
WBC: 3.9 10*3/uL (ref 3.8–10.8)

## 2022-03-03 LAB — COMPLETE METABOLIC PANEL WITH GFR
AG Ratio: 1.6 (calc) (ref 1.0–2.5)
ALT: 43 U/L — ABNORMAL HIGH (ref 6–29)
AST: 35 U/L (ref 10–35)
Albumin: 4.2 g/dL (ref 3.6–5.1)
Alkaline phosphatase (APISO): 90 U/L (ref 37–153)
BUN/Creatinine Ratio: 15 (calc) (ref 6–22)
BUN: 19 mg/dL (ref 7–25)
CO2: 27 mmol/L (ref 20–32)
Calcium: 9.6 mg/dL (ref 8.6–10.4)
Chloride: 99 mmol/L (ref 98–110)
Creat: 1.27 mg/dL — ABNORMAL HIGH (ref 0.60–0.95)
Globulin: 2.7 g/dL (calc) (ref 1.9–3.7)
Glucose, Bld: 89 mg/dL (ref 65–99)
Potassium: 3.7 mmol/L (ref 3.5–5.3)
Sodium: 138 mmol/L (ref 135–146)
Total Bilirubin: 0.9 mg/dL (ref 0.2–1.2)
Total Protein: 6.9 g/dL (ref 6.1–8.1)
eGFR: 41 mL/min/{1.73_m2} — ABNORMAL LOW (ref >=60)

## 2022-03-03 LAB — LIPID PANEL
Cholesterol: 148 mg/dL (ref <200)
HDL: 60 mg/dL (ref >=50)
LDL Cholesterol (Calc): 73 mg/dL (calc)
Non-HDL Cholesterol (Calc): 88 mg/dL (calc) (ref <130)
Total CHOL/HDL Ratio: 2.5 (calc) (ref <5.0)
Triglycerides: 70 mg/dL (ref <150)

## 2022-03-03 LAB — TSH: TSH: 2.47 mIU/L (ref 0.40–4.50)

## 2022-03-05 ENCOUNTER — Ambulatory Visit (INDEPENDENT_AMBULATORY_CARE_PROVIDER_SITE_OTHER): Payer: Medicare Other | Admitting: Internal Medicine

## 2022-03-05 ENCOUNTER — Encounter: Payer: Self-pay | Admitting: Internal Medicine

## 2022-03-05 VITALS — BP 100/68 | HR 68 | Temp 97.8°F | Ht 59.0 in | Wt 130.8 lb

## 2022-03-05 DIAGNOSIS — M549 Dorsalgia, unspecified: Secondary | ICD-10-CM | POA: Diagnosis not present

## 2022-03-05 DIAGNOSIS — F411 Generalized anxiety disorder: Secondary | ICD-10-CM

## 2022-03-05 DIAGNOSIS — R413 Other amnesia: Secondary | ICD-10-CM | POA: Diagnosis not present

## 2022-03-05 DIAGNOSIS — G8929 Other chronic pain: Secondary | ICD-10-CM

## 2022-03-05 DIAGNOSIS — F419 Anxiety disorder, unspecified: Secondary | ICD-10-CM | POA: Diagnosis not present

## 2022-03-05 DIAGNOSIS — F32A Depression, unspecified: Secondary | ICD-10-CM

## 2022-03-05 DIAGNOSIS — Z8659 Personal history of other mental and behavioral disorders: Secondary | ICD-10-CM

## 2022-03-05 DIAGNOSIS — Z0000 Encounter for general adult medical examination without abnormal findings: Secondary | ICD-10-CM

## 2022-03-05 DIAGNOSIS — M8580 Other specified disorders of bone density and structure, unspecified site: Secondary | ICD-10-CM | POA: Diagnosis not present

## 2022-03-05 DIAGNOSIS — I10 Essential (primary) hypertension: Secondary | ICD-10-CM | POA: Diagnosis not present

## 2022-03-05 DIAGNOSIS — F5102 Adjustment insomnia: Secondary | ICD-10-CM | POA: Diagnosis not present

## 2022-03-05 DIAGNOSIS — M4807 Spinal stenosis, lumbosacral region: Secondary | ICD-10-CM | POA: Diagnosis not present

## 2022-03-05 DIAGNOSIS — M5416 Radiculopathy, lumbar region: Secondary | ICD-10-CM

## 2022-03-05 DIAGNOSIS — I071 Rheumatic tricuspid insufficiency: Secondary | ICD-10-CM

## 2022-03-05 LAB — POCT URINALYSIS DIPSTICK
Bilirubin, UA: NEGATIVE
Blood, UA: NEGATIVE
Glucose, UA: NEGATIVE
Ketones, UA: NEGATIVE
Leukocytes, UA: NEGATIVE
Nitrite, UA: NEGATIVE
Protein, UA: NEGATIVE
Spec Grav, UA: <=1.005 — AB (ref 1.010–1.025)
Urobilinogen, UA: 0.2 E.U./dL
pH, UA: 6 (ref 5.0–8.0)

## 2022-03-05 NOTE — Progress Notes (Signed)
Annual Wellness Visit     Patient: Melissa [name], Female    DOB: [birth year], 88 y.o.   MRN: [id number] Visit Date: 03/05/2022  Chief Complaint  Patient presents with   Medicare Wellness   Subjective    Melissa [name] is a 88 y.o. Female who presents today for her Annual Wellness Visit. She also presents for evaluation of medical issues.  Reportedly, a sacral nerve stimulator was placed in patient in [place] a number of years ago reportedly due to bladder issues. We do not have those records.  Apparently there was a history of bladder dysfunction with urinary frequency.  She  says she had the device turned off because it was causing some odd vibrations in her vagina did not really help her bladder problem.  Patient was first seen here in July 2019.  She was complaining of right leg pain after a fall some 4 months previously.  She had been recently widowed at that time.  Her husband was a former Economist of [employer].  She and her husband had moved from [place] to [place] to be near their daughter, [name].  MRI of the LS spine in 2016 showed multilevel spinal stenosis in the lumbar region.  She did see Dr. Vear Clock for spinal stenosis and chronic pain management in 2021 and 2022  She saw Cardiologist with [facility] in May 2022.  Had normal ABIs.  Echocardiogram showed grade 1 diastolic dysfunction.  Has been diagnosed with severe tricuspid regurgitation (nonrheumatic), hypertension and peripheral artery disease.  Maintained on Plavix.    Reported at that time she had bladder dysfunction with urinary frequency and a sacral nerve stimulator for that issue.  History of hypertension, hyperlipidemia, benign right breast biopsy, anxiety and depression as well as memory loss.  For a while she went to grief counseling at [facility].  She was noted to have anxiety particularly when driving.  Seldom drives anymore.  Resides with her daughter and  son-in-law.  She has her own room upstairs  Patient has a history of chronic bilateral low back pain with bilateral sciatica./ lumbar spinal stenosis  Has been seen at [facility] in [facility] and also by Dr. Ethelene Hal at [facility].  She has had numerous epidural steroid treatments for chronic lumbar radiculopathy.  At first it was not clear to me why she had this device.  I assumed it was for chronic back pain but she contends it is for bladder issues.  She wants it removed now.I do not know if that is a good idea at her age.      Social history: Widowed.  Resides with daughter and son-in-law.  2 adult children.  Family history-see previous dictation  She had neuropsychological testing by Dr. Abbey Chatters in December 2022 who felt she had moderate late onset Alzheimer's dementia with anxiety.  Subsequently saw Dr. Levada Dy who suggested Namenda and advised continuing with sertraline, mirtazapine and alprazolam.  CT of the brain in late June showed mild to moderate chronic small vessel ischemic disease with no hemorrhage or tumor.  She has Xanax to take in the morning and at bedtime for anxiety.      Patient Care Team: Margaree Mackintosh, MD as PCP - General (Internal Medicine) Christell Constant, MD as PCP - Cardiology (Cardiology) Shelbey Spindler, Luanna Cole, MD (Internal Medicine) Christell Constant, MD as Consulting Physician (Cardiology)  Review of Systems see above   Objective    Vitals: BP 100/68  Pulse 68   Temp 97.8 F (36.6 C) (Tympanic)   Ht 4\' 11"  (1.499 m)   Wt 130 lb 12 oz (59.3 kg)   SpO2 97%   BMI 26.41 kg/m   Physical Exam Skin: Warm and dry.  No cervical adenopathy.  No thyromegaly.  Pharynx is clear.  TMs clear.  Neck supple.  Chest clear.  Cardiac exam: Regular rate and rhythm without ectopy today.  Abdomen soft nondistended without hepatosplenomegaly masses or tenderness.  No lower extremity pitting edema.  Patient appears to be anxious  and is asking a lot of questions.  Most recent functional status assessment:    03/05/2022   11:11 AM  In your present state of health, do you have any difficulty performing the following activities:  Hearing? 0  Vision? 0  Difficulty concentrating or making decisions? 1  Walking or climbing stairs? 1  Dressing or bathing? 0  Doing errands, shopping? 1  Preparing Food and eating ? N  Using the Toilet? N  In the past six months, have you accidently leaked urine? N  Do you have problems with loss of bowel control? N  Managing your Finances? N  Housekeeping or managing your Housekeeping? Y   Most recent fall risk assessment:    03/05/2022   11:10 AM  Fall Risk   Falls in the past year? 0  Number falls in past yr: 0  Injury with Fall? 0  Risk for fall due to : Impaired balance/gait;Impaired mobility  Follow up Falls evaluation completed    Most recent depression screenings:    03/05/2022   11:10 AM 03/03/2021   11:08 AM  PHQ 2/9 Scores  PHQ - 2 Score 6 2  PHQ- 9 Score 6 3   Most recent cognitive screening:    03/05/2022   11:12 AM  6CIT Screen  What Year? 0 points  What month? 0 points  What time? 0 points  Count back from 20 0 points  Months in reverse 2 points  Repeat phrase 2 points  Total Score 4 points       Assessment & Plan     Annual wellness visit done today including the all of the following: Reviewed patient's Family Medical History Reviewed and updated list of patient's medical providers Assessment of cognitive impairment was done Assessed patient's functional ability Established a written schedule for health screening services Health Risk Assessent Completed and Reviewed  Discussed health benefits of physical activity, and encouraged her to engage in regular exercise appropriate for her age and condition.    She has a spinal stimulator that was placed apparently for bladder dysfunction and is adamant about having this removed.  I do not think  this is a good idea at her age and her current medical issues but we will ask that patient and her daughter see urology for evaluation.  She has chronic lumbar back pain  Hyperlipidemia  Chronic anxiety treated with Xanax  Memory loss treated with Remeron  Tricuspid regurgitation seen by cardiologist and she is currently on Plavix  History of hypertension treated with valsartan  Plan: See recommendations above.  Return in 1 year or as needed.     {I, 03/07/2022, MD, have reviewed all documentation for this visit. The documentation on 03/08/22 for the exam, diagnosis, procedures, and orders are all accurate and complete.   03/10/22, CMA

## 2022-03-08 NOTE — Patient Instructions (Addendum)
She wants to have spinal stimulator removed.  Says it was placed for a bladder issue.  We can ask urology if they are willing to see her regarding this.  She is pretty adamant about having it removed.  I do not think it is going to affect her back pain which I think is due to spinal stenosis.  She may return here in 1 year or as needed and continue with current medications.  We have offered her  options for chronic pain management and she has had epidural steroid injections previously.  Fortunately has limited her driving.  Still has considerable anxiety.  Resides with daughter.

## 2022-03-16 ENCOUNTER — Other Ambulatory Visit: Payer: Medicare Other

## 2022-03-16 DIAGNOSIS — I10 Essential (primary) hypertension: Secondary | ICD-10-CM

## 2022-03-16 DIAGNOSIS — E785 Hyperlipidemia, unspecified: Secondary | ICD-10-CM

## 2022-03-16 DIAGNOSIS — I739 Peripheral vascular disease, unspecified: Secondary | ICD-10-CM | POA: Diagnosis not present

## 2022-03-16 LAB — COMPREHENSIVE METABOLIC PANEL
ALT: 18 IU/L (ref 0–32)
AST: 19 IU/L (ref 0–40)
Albumin/Globulin Ratio: 1.6 (ref 1.2–2.2)
Albumin: 4 g/dL (ref 3.7–4.7)
Alkaline Phosphatase: 88 IU/L (ref 44–121)
BUN/Creatinine Ratio: 17 (ref 12–28)
BUN: 15 mg/dL (ref 8–27)
Bilirubin Total: 0.5 mg/dL (ref 0.0–1.2)
CO2: 24 mmol/L (ref 20–29)
Calcium: 9.6 mg/dL (ref 8.7–10.3)
Chloride: 102 mmol/L (ref 96–106)
Creatinine, Ser: 0.87 mg/dL (ref 0.57–1.00)
Globulin, Total: 2.5 g/dL (ref 1.5–4.5)
Glucose: 93 mg/dL (ref 70–99)
Potassium: 3.8 mmol/L (ref 3.5–5.2)
Sodium: 138 mmol/L (ref 134–144)
Total Protein: 6.5 g/dL (ref 6.0–8.5)
eGFR: 64 mL/min/{1.73_m2} (ref >59)

## 2022-03-16 LAB — LIPID PANEL
Chol/HDL Ratio: 2.5 ratio (ref 0.0–4.4)
Cholesterol, Total: 153 mg/dL (ref 100–199)
HDL: 62 mg/dL (ref >39)
LDL Chol Calc (NIH): 77 mg/dL (ref 0–99)
Triglycerides: 73 mg/dL (ref 0–149)
VLDL Cholesterol Cal: 14 mg/dL (ref 5–40)

## 2022-05-12 DIAGNOSIS — Z23 Encounter for immunization: Secondary | ICD-10-CM | POA: Diagnosis not present

## 2022-06-04 ENCOUNTER — Ambulatory Visit: Payer: Medicare Other

## 2022-06-11 ENCOUNTER — Ambulatory Visit
Admission: RE | Admit: 2022-06-11 | Discharge: 2022-06-11 | Disposition: A | Discharge status: Home / Self Care | Payer: Medicare Other | Source: Ambulatory Visit | Attending: Internal Medicine | Admitting: Internal Medicine

## 2022-06-11 DIAGNOSIS — Z1231 Encounter for screening mammogram for malignant neoplasm of breast: Secondary | ICD-10-CM | POA: Diagnosis not present

## 2022-06-17 ENCOUNTER — Ambulatory Visit (HOSPITAL_COMMUNITY): Payer: Medicare Other | Attending: Internal Medicine

## 2022-06-17 DIAGNOSIS — I071 Rheumatic tricuspid insufficiency: Secondary | ICD-10-CM | POA: Diagnosis not present

## 2022-06-17 LAB — ECHOCARDIOGRAM COMPLETE
Area-P 1/2: 3.91 cm2
S' Lateral: 2 cm

## 2022-06-28 ENCOUNTER — Other Ambulatory Visit: Payer: Self-pay | Admitting: Internal Medicine

## 2022-07-01 ENCOUNTER — Ambulatory Visit: Payer: Medicare Other | Attending: Internal Medicine | Admitting: Internal Medicine

## 2022-07-01 ENCOUNTER — Encounter: Payer: Self-pay | Admitting: Internal Medicine

## 2022-07-01 VITALS — BP 110/70 | HR 80 | Ht 59.0 in | Wt 137.8 lb

## 2022-07-01 DIAGNOSIS — I10 Essential (primary) hypertension: Secondary | ICD-10-CM | POA: Insufficient documentation

## 2022-07-01 DIAGNOSIS — E785 Hyperlipidemia, unspecified: Secondary | ICD-10-CM | POA: Diagnosis not present

## 2022-07-01 DIAGNOSIS — R0609 Other forms of dyspnea: Secondary | ICD-10-CM | POA: Diagnosis not present

## 2022-07-01 DIAGNOSIS — I739 Peripheral vascular disease, unspecified: Secondary | ICD-10-CM | POA: Insufficient documentation

## 2022-07-01 DIAGNOSIS — I071 Rheumatic tricuspid insufficiency: Secondary | ICD-10-CM | POA: Diagnosis not present

## 2022-07-01 DIAGNOSIS — I361 Nonrheumatic tricuspid (valve) insufficiency: Secondary | ICD-10-CM | POA: Insufficient documentation

## 2022-07-01 NOTE — Progress Notes (Signed)
Cardiology Office Note:    Date:  07/01/2022   ID:  Melissa [name], DOB Sep 19, 1930, MRN [id number]  PCP:  Margaree Mackintosh, MD   [facility]  Cardiologist:  Christell Constant, MD  Advanced Practice Provider:  No care team member to display Electrophysiologist:  None   CC: LE follow up  History of Present Illness:    Melissa [name] is a 88 y.o. female with a hx of HTN, HLD, Spinal stenosis who presents for evaluation 09/23/20.  2022: Patient had mildly abnormal TBI's, Severe TR.  On minimal diuretics her volume status improved but repeat echo showed persistent severe TR.   2023: Called in; she stopped her lasix inadverently and had leg swelling.  Started diuretic and this improved.  Heart creatinine was worse but we did not stop her diuretics due to her leg swelling and problems stopping in the past.  Has worsening myalgias.  Patient notes that she is doing having chest tenderness.   They upgraded her mammogram. That was normal.  It first occurred with her booster shot for COVID-19 It went away but came back when she has gotten vaccinations- it occurs when she gets shots in the right arm  Her pain is about the same.  When it occurs she can feel it in her heart.  She has been able to get up and about this past three days. No SOB Sometimes feels her heart in her ears  No SOB/DOE and no PND/Orthopnea.  No weight gain. She notes she has had some rare leg swelling   Past Medical History:  Diagnosis Date   Allergy    Alzheimer disease (HCC)    Anxiety    Bladder dysfunction    Bulging lumbar disc    Depression    Hyperlipidemia    Hypertension    Low back pain    Lumbar radiculopathy    Spinal stenosis     Past Surgical History:  Procedure Laterality Date   bladder dysfunction     bladder stimulator     BREAST CYST EXCISION     CATARACT EXTRACTION      Current Medications: Current Meds  Medication Sig   ALPRAZolam (XANAX) 0.5 MG  tablet Take one tab in the morning and at bedtime   atorvastatin (LIPITOR) 80 MG tablet Take 1 tablet (80 mg total) by mouth daily.   cholecalciferol (VITAMIN D) 1000 units tablet Take 1,000 Units by mouth daily.   clopidogrel (PLAVIX) 75 MG tablet TAKE 1 TABLET(75 MG) BY MOUTH DAILY   furosemide (LASIX) 80 MG tablet Take 1 tablet (80 mg total) by mouth daily.   mirtazapine (REMERON) 15 MG tablet One half to one tablet at bedtime   potassium chloride SA (KLOR-CON M) 20 MEQ tablet Take 2 tablets (40 mEq total) by mouth daily.   sertraline (ZOLOFT) 100 MG tablet TAKE 1 TABLET(100 MG) BY MOUTH DAILY   TURMERIC PO Take by mouth daily.   valsartan (DIOVAN) 80 MG tablet Take 1 tablet (80 mg total) by mouth daily.     Allergies:   Aspirin, Aspirin, Codeine, and Tramadol   Social History   Socioeconomic History   Marital status: Widowed    Spouse name: Not on file   Number of children: 2   Years of education: Not on file   Highest education level: Bachelor's degree (e.g., BA, AB, BS)  Occupational History   Not on file  Tobacco Use   Smoking status: Never   Smokeless  tobacco: Never  Vaping Use   Vaping Use: Never used  Substance and Sexual Activity   Alcohol use: Not Currently    Comment: Reports 1/2 glass of wine periodically   Drug use: Never   Sexual activity: Not on file  Other Topics Concern   Not on file  Social History Narrative   01/06/22 lives with dgtr Alvino ChapelEllen   Social Determinants of Health   Financial Resource Strain: Not on file  Food Insecurity: Not on file  Transportation Needs: Not on file  Physical Activity: Not on file  Stress: Not on file  Social Connections: Not on file     Family History: The patient's family history includes Alcohol abuse in her father; Anxiety disorder in her granddaughter, niece, and sister; Arthritis in her mother.  History of coronary artery disease notable for no members. History of heart failure notable for no members. History of  arrhythmia notable for no members.  ROS:   Please see the history of present illness.     All other systems reviewed and are negative.  EKGs/Labs/Other Studies Reviewed:    The following studies were reviewed today:  EKG:  07/01/22: SR inferior TWI low voltage rate 80 12/22/21: SR inferior 79 TWI 09/23/20: SR rate 97, Nonspecific TWI 09/09/20: SR rate 78 with non-specific TWI  Cardiac Studies & Procedures       ECHOCARDIOGRAM  ECHOCARDIOGRAM COMPLETE 06/17/2022  Narrative ECHOCARDIOGRAM REPORT    Patient Name:   Thomas Bick Date of Exam: 06/17/2022 Medical Rec #:  [id number]        Height:       59.0 in Accession #:    [id number]       Weight:       130.7 lb Date of Birth:  [birth year]       BSA:          1.540 m Patient Age:    89 years         BP:           100/68 mmHg Patient Gender: F                HR:           74 bpm. Exam Location:  [address]  Procedure: 2D Echo, 3D Echo, Cardiac Doppler and Color Doppler  Indications:    I07.1 Severe Tricuspid Insufficiency  History:        Patient has prior history of Echocardiogram examinations, most recent 12/11/2021. Risk Factors:Hypertension and Dyslipidemia.  Sonographer:    Farrel ConnersBethany Mcmahill RDCS Referring Phys: Margaree MackintoshMARY J BAXLEY  IMPRESSIONS   1. Left ventricular ejection fraction, by estimation, is 55 to 60%. The left ventricle has normal function. The left ventricle has no regional wall motion abnormalities. Left ventricular diastolic parameters are consistent with Grade I diastolic dysfunction (impaired relaxation). 2. Right ventricular systolic function is normal. The right ventricular size is moderately enlarged. There is mildly elevated pulmonary artery systolic pressure. 3. Right atrial size was moderately dilated. 4. The mitral valve is normal in structure. Trivial mitral valve regurgitation. No evidence of mitral stenosis. 5. Tricuspid valve regurgitation is moderate. 6. The aortic valve is tricuspid.  Aortic valve regurgitation is not visualized. No aortic stenosis is present. 7. The inferior vena cava is normal in size with greater than 50% respiratory variability, suggesting right atrial pressure of 3 mmHg.  FINDINGS Left Ventricle: Left ventricular ejection fraction, by estimation, is 55 to 60%. The left ventricle has normal function. The left  ventricle has no regional wall motion abnormalities. The left ventricular internal cavity size was normal in size. There is no left ventricular hypertrophy. Left ventricular diastolic parameters are consistent with Grade I diastolic dysfunction (impaired relaxation).  Right Ventricle: The right ventricular size is moderately enlarged. Right ventricular systolic function is normal. There is mildly elevated pulmonary artery systolic pressure. The tricuspid regurgitant velocity is 2.71 m/s, and with an assumed right atrial pressure of 8 mmHg, the estimated right ventricular systolic pressure is 37.4 mmHg.  Left Atrium: Left atrial size was normal in size.  Right Atrium: Right atrial size was moderately dilated.  Pericardium: Trivial pericardial effusion is present.  Mitral Valve: The mitral valve is normal in structure. Trivial mitral valve regurgitation. No evidence of mitral valve stenosis.  Tricuspid Valve: The tricuspid valve is normal in structure. Tricuspid valve regurgitation is moderate . No evidence of tricuspid stenosis.  Aortic Valve: The aortic valve is tricuspid. Aortic valve regurgitation is not visualized. No aortic stenosis is present.  Pulmonic Valve: The pulmonic valve was normal in structure. Pulmonic valve regurgitation is trivial. No evidence of pulmonic stenosis.  Aorta: The aortic root is normal in size and structure.  Venous: The inferior vena cava is normal in size with greater than 50% respiratory variability, suggesting right atrial pressure of 3 mmHg.  IAS/Shunts: No atrial level shunt detected by color flow  Doppler.   LEFT VENTRICLE PLAX 2D LVIDd:         3.00 cm   Diastology LVIDs:         2.00 cm   LV e' medial:    6.84 cm/s LV PW:         0.60 cm   LV E/e' medial:  6.6 LV IVS:        1.00 cm   LV e' lateral:   6.68 cm/s LVOT diam:     1.90 cm   LV E/e' lateral: 6.8 LV SV:         36 LV SV Index:   23 LVOT Area:     2.84 cm  3D Volume EF: 3D EF:        69 % LV EDV:       91 ml LV ESV:       28 ml LV SV:        63 ml  RIGHT VENTRICLE RV Basal diam:  3.95 cm RV Mid diam:    4.20 cm  LEFT ATRIUM           Index        RIGHT ATRIUM           Index LA diam:      2.90 cm 1.88 cm/m   RA Area:     25.00 cm LA Vol (A2C): 55.7 ml 36.18 ml/m  RA Volume:   91.00 ml  59.11 ml/m AORTIC VALVE LVOT Vmax:   60.97 cm/s LVOT Vmean:  41.933 cm/s LVOT VTI:    0.126 m  AORTA Ao Root diam: 2.50 cm Ao Asc diam:  2.80 cm  MITRAL VALVE               TRICUSPID VALVE MV Area (PHT): cm         TR Peak grad:   29.4 mmHg MV Decel Time: 194 msec    TR Vmax:        271.00 cm/s MV E velocity: 45.10 cm/s MV A velocity: 81.00 cm/s  SHUNTS MV E/A ratio:  0.56  Systemic VTI:  0.13 m Systemic Diam: 1.90 cm  Olga Millers MD Electronically signed by Olga Millers MD Signature Date/Time: 06/17/2022/1:07:53 PM    Final               Recent Labs: 07/17/2021: Magnesium 2.2; NT-Pro BNP 68 03/02/2022: Hemoglobin 11.5; Platelets 213; TSH 2.47 03/16/2022: ALT 18; BUN 15; Creatinine, Ser 0.87; Potassium 3.8; Sodium 138  Recent Lipid Panel    Component Value Date/Time   CHOL 153 03/16/2022 0935   TRIG 73 03/16/2022 0935   HDL 62 03/16/2022 0935   CHOLHDL 2.5 03/16/2022 0935   CHOLHDL 2.5 03/02/2022 0923   LDLCALC 77 03/16/2022 0935   LDLCALC 73 03/02/2022 0923    Physical Exam:    VS:  BP 110/70   Pulse 80   Ht  (1.499 m)   Wt 137 lb 12.8 oz (62.5 kg)   SpO2 97%   BMI 27.83 kg/m     Wt Readings from Last 3 Encounters:  07/01/22 137 lb 12.8 oz (62.5 kg)  03/05/22 130 lb  12 oz (59.3 kg)  01/06/22 141 lb (64 kg)   Gen: no distress Neck: JVD has improved Cardiac: No Rubs or Gallops, holosystolc 3/6 murmur, RRR , +2 radial pulses Respiratory: Clear to auscultation bilaterally, normal effort, normal  respiratory rate GI: Soft, nontender, non-distended  MS: No LE edema;  moves all extremities Integument: Skin feels warm Neuro:  At time of evaluation, alert and oriented to person/place/time/situation  Psych: Normal affect, patient feels pl   ASSESSMENT:    1. Severe tricuspid regurgitation   2. Hyperlipidemia, unspecified hyperlipidemia type   3. Essential hypertension   4. Nonrheumatic tricuspid valve regurgitation   5. PAD (peripheral artery disease) (HCC)     PLAN:    Moderate TR with LE swelling and DOE With suspected underlying CKD NOS HTN - lasix 80 mg PO Daily and K 40 meq - BNP and Bmp today - may add [place]; we discussed risk and benefits - will space out echo to one year - we have reviewed T-TEER; at 39 and with her comorbidity we plan for medical management of her disease  PAD  HLD Spinal stenosis - plavix 75 mg PO daily - cilastatozol was stopped  Non cardiac CP - unless change in character  Rare Palpitations - EKG stable; reviewed with family; low threshold for ziopatch  6 months APP One year me  Time Spent Directly with Patient:   I have spent a total of 40 minutes with the patient reviewing notes, imaging, EKGs, labs and examining the patient as well as establishing an assessment and plan that was discussed personally with the patient.  > 50% of time was spent in direct patient care  family.   Medication Adjustments/Labs and Tests Ordered: Current medicines are reviewed at length with the patient today.  Concerns regarding medicines are outlined above.  Orders Placed This Encounter  Procedures   Lipid panel   Basic metabolic panel   Pro b natriuretic peptide (BNP)   ALT   EKG 12-Lead   ECHOCARDIOGRAM COMPLETE     No orders of the defined types were placed in this encounter.    Patient Instructions  Medication Instructions:  Your physician recommends that you continue on your current medications as directed. Please refer to the Current Medication list given to you today.  *If you need a refill on your cardiac medications before your next appointment, please call your pharmacy*  Lab Work: TODAY: BMP,  BNP, Lipids, ALT If you have labs (blood work) drawn today and your tests are completely normal, you will receive your results only by: MyChart Message (if you have MyChart) OR A paper copy in the mail If you have any lab test that is abnormal or we need to change your treatment, we will call you to review the results.  Testing/Procedures: Your physician has requested that you have an echocardiogram in November 2024. Echocardiography is a painless test that uses sound waves to create images of your heart. It provides your doctor with information about the size and shape of your heart and how well your heart's chambers and valves are working. This procedure takes approximately one hour. There are no restrictions for this procedure. Please do NOT wear cologne, perfume, aftershave, or lotions (deodorant is allowed). Please arrive 15 minutes prior to your appointment time.   Follow-Up: At [facility], you and your health needs are our priority.  As part of our continuing mission to provide you with exceptional heart care, we have created designated Provider Care Teams.  These Care Teams include your primary Cardiologist (physician) and Advanced Practice Providers (APPs -  Physician Assistants and Nurse Practitioners) who all work together to provide you with the care you need, when you need it.  Your next appointment:   6 month(s) with APP 1 year with Riley Lam, MD  The format for your next appointment:   In Person  Provider:   Christell Constant, MD  or Jari Favre,  PA-C, Robin Searing, NP, Eligha Bridegroom, NP, or Tereso Newcomer, PA-C   Important Information About Sugar         Signed, Christell Constant, MD  07/01/2022 10:09 AM    [facility]

## 2022-07-01 NOTE — Patient Instructions (Signed)
Medication Instructions:  Your physician recommends that you continue on your current medications as directed. Please refer to the Current Medication list given to you today.  *If you need a refill on your cardiac medications before your next appointment, please call your pharmacy*  Lab Work: TODAY: BMP, BNP, Lipids, ALT If you have labs (blood work) drawn today and your tests are completely normal, you will receive your results only by: MyChart Message (if you have MyChart) OR A paper copy in the mail If you have any lab test that is abnormal or we need to change your treatment, we will call you to review the results.  Testing/Procedures: Your physician has requested that you have an echocardiogram in November 2024. Echocardiography is a painless test that uses sound waves to create images of your heart. It provides your doctor with information about the size and shape of your heart and how well your heart's chambers and valves are working. This procedure takes approximately one hour. There are no restrictions for this procedure. Please do NOT wear cologne, perfume, aftershave, or lotions (deodorant is allowed). Please arrive 15 minutes prior to your appointment time.   Follow-Up: At [facility], you and your health needs are our priority.  As part of our continuing mission to provide you with exceptional heart care, we have created designated Provider Care Teams.  These Care Teams include your primary Cardiologist (physician) and Advanced Practice Providers (APPs -  Physician Assistants and Nurse Practitioners) who all work together to provide you with the care you need, when you need it.  Your next appointment:   6 month(s) with APP 1 year with Riley Lam, MD  The format for your next appointment:   In Person  Provider:   Christell Constant, MD  or Jari Favre, PA-C, Robin Searing, NP, Eligha Bridegroom, NP, or Tereso Newcomer, PA-C   Important Information About  Sugar

## 2022-07-02 LAB — BASIC METABOLIC PANEL
BUN/Creatinine Ratio: 19 (ref 12–28)
BUN: 19 mg/dL (ref 8–27)
CO2: 24 mmol/L (ref 20–29)
Calcium: 9.8 mg/dL (ref 8.7–10.3)
Chloride: 104 mmol/L (ref 96–106)
Creatinine, Ser: 1.01 mg/dL — ABNORMAL HIGH (ref 0.57–1.00)
Glucose: 87 mg/dL (ref 70–99)
Potassium: 4.4 mmol/L (ref 3.5–5.2)
Sodium: 141 mmol/L (ref 134–144)
eGFR: 53 mL/min/{1.73_m2} — ABNORMAL LOW (ref >59)

## 2022-07-02 LAB — PRO B NATRIURETIC PEPTIDE: NT-Pro BNP: 46 pg/mL (ref 0–738)

## 2022-07-02 LAB — LIPID PANEL
Chol/HDL Ratio: 2.5 ratio (ref 0.0–4.4)
Cholesterol, Total: 168 mg/dL (ref 100–199)
HDL: 67 mg/dL (ref >39)
LDL Chol Calc (NIH): 85 mg/dL (ref 0–99)
Triglycerides: 90 mg/dL (ref 0–149)
VLDL Cholesterol Cal: 16 mg/dL (ref 5–40)

## 2022-07-02 LAB — ALT: ALT: 17 IU/L (ref 0–32)

## 2022-08-15 ENCOUNTER — Other Ambulatory Visit: Payer: Self-pay | Admitting: Internal Medicine

## 2022-08-26 ENCOUNTER — Other Ambulatory Visit: Payer: Self-pay | Admitting: Internal Medicine

## 2022-08-26 DIAGNOSIS — Z1231 Encounter for screening mammogram for malignant neoplasm of breast: Secondary | ICD-10-CM

## 2022-08-27 ENCOUNTER — Ambulatory Visit (INDEPENDENT_AMBULATORY_CARE_PROVIDER_SITE_OTHER): Payer: Medicare Other | Admitting: Internal Medicine

## 2022-08-27 ENCOUNTER — Telehealth: Payer: Self-pay | Admitting: Internal Medicine

## 2022-08-27 VITALS — BP 112/70 | HR 85 | Temp 98.9°F | Ht 59.0 in | Wt 144.0 lb

## 2022-08-27 DIAGNOSIS — I10 Essential (primary) hypertension: Secondary | ICD-10-CM | POA: Diagnosis not present

## 2022-08-27 DIAGNOSIS — H00011 Hordeolum externum right upper eyelid: Secondary | ICD-10-CM | POA: Diagnosis not present

## 2022-08-27 DIAGNOSIS — R413 Other amnesia: Secondary | ICD-10-CM | POA: Diagnosis not present

## 2022-08-27 DIAGNOSIS — Z8659 Personal history of other mental and behavioral disorders: Secondary | ICD-10-CM | POA: Diagnosis not present

## 2022-08-27 DIAGNOSIS — F411 Generalized anxiety disorder: Secondary | ICD-10-CM | POA: Diagnosis not present

## 2022-08-27 MED ORDER — BACITRACIN-POLYMYXIN B 500-10000 UNIT/GM OP OINT: 1.0000 | TOPICAL_OINTMENT | Freq: Three times a day (TID) | OPHTHALMIC | 0 refills | Status: DC

## 2022-08-27 NOTE — Progress Notes (Signed)
   Subjective:    Patient ID: Melissa [name], female    DOB: [birth year], 89 y.o.   MRN: [id number]  HPI Mrs. Icenhower is accompanied by her daughter, Nelson Chimes, today.  Dorian Pod called to say that patient might have a right eye infection.  Patient resides with daughter and son-in-law.  Patient notes no injury to her eye that she is aware of.  Has noticed a swelling right upper eyelid and perhaps some drainage in corner of the eye.  No issues with her vision at this time.  No fever or chills.  No complaint of blurry vision.  Patient has a history of chronic back pain, hypertension, hyperlipidemia, anxiety and depression as well as mild memory loss.  History of tricuspid regurgitation seen by cardiology and she is on Plavix.   Review of Systems no nausea, vomiting, fever or chills     Objective:   Physical Exam  Blood pressure 112/70 pulse 85 temperature 98.9 degrees pulse oximetry 98% on room air weight 144 pounds BMI 29.08  Extraocular movements are full in both eyes.  PERRLA.  She has a slight bit of discolored drainage in the corner of her right eye.  However she has swelling of upper eyelid consistent with hordeolum      Assessment & Plan:  Hordeolum right eye  Memory loss-evaluated by neuropsychologist  Essential hypertension-stable on current regimen  Anxiety and depression  May be developing mild episode of conjunctivitis  Plan: Warm compresses to right eyelid for 20 minutes 2-3 times daily.  Prescribed Polysporin ophthalmic ointment to be applied to right eye 3 times daily for about 5 days.  Hordeolum should resolve within a few days.

## 2022-08-27 NOTE — Telephone Encounter (Signed)
Nelson Chimes [phone]  Dorian Pod called to say that Sherron may possibly have a right eye infection so I scheduled her to come in at 4:00 today.

## 2022-09-06 NOTE — Patient Instructions (Signed)
Apply warm compresses to right eyelid for 20 minutes 2-3 times daily for approximately 5 days.  Apply Polysporin ophthalmic ointment to right eye 3 times daily for about 5 days.  Call back if not resolving or worsening.

## 2022-09-08 ENCOUNTER — Ambulatory Visit
Admission: RE | Admit: 2022-09-08 | Discharge: 2022-09-08 | Disposition: A | Discharge status: Home / Self Care | Payer: Medicare Other | Source: Ambulatory Visit | Attending: Internal Medicine | Admitting: Internal Medicine

## 2022-09-08 ENCOUNTER — Telehealth: Payer: Self-pay | Admitting: Internal Medicine

## 2022-09-08 DIAGNOSIS — M810 Age-related osteoporosis without current pathological fracture: Secondary | ICD-10-CM | POA: Diagnosis not present

## 2022-09-08 DIAGNOSIS — Z780 Asymptomatic menopausal state: Secondary | ICD-10-CM | POA: Diagnosis not present

## 2022-09-08 NOTE — Telephone Encounter (Signed)
-----  Message from Pearlean Brownie, [place] sent at 09/08/2022  2:48 PM EST ----- Results have been relayed to the patient and daughter . The patient verbalized understanding. No questions at this time.  Patient would like to get started on the process of getting Prolia approved

## 2022-09-08 NOTE — Telephone Encounter (Signed)
This patient is interested in starting Prolia Injection, Would you please work on Prior  Auth please.

## 2022-09-11 ENCOUNTER — Other Ambulatory Visit (HOSPITAL_COMMUNITY): Payer: Self-pay

## 2022-09-11 NOTE — Telephone Encounter (Signed)
Prolia VOB initiated via [email or web address] 

## 2022-09-14 NOTE — Telephone Encounter (Signed)
I was looking at [place] and this patient verification has been put on hold because it looks like her next injection is not due until August and she has not been approved for her first injection yet

## 2022-09-15 NOTE — Telephone Encounter (Signed)
Place a call to [employer] support to follow up on the benefits verification. Spoke with Anderson Malta and she stated they are working on it at the moment and verification should be available soon. I let her know it was the start and she would be receiving treatment soon.

## 2022-09-24 NOTE — Telephone Encounter (Signed)
  Subject:          Fw: Hp Scans Result:           The transmission was successful. Explanation:      All Pages Ok Pages Sent:       5 Connect Time:     5 minutes, 52 seconds Transmit Time:    09/24/2022 08:31 Transfer Rate:    9600 Status Code:      0000 Retry Count:      0 Job Id:           [id number] Unique Id:        [id number] Fax Line:         20 Fax Server:       [employer]

## 2022-09-24 NOTE — Telephone Encounter (Signed)
I faxed the [place] back to [place] for assistance with Prolia [phone], phone [phone]

## 2022-09-25 NOTE — Telephone Encounter (Signed)
Called and spoke with Dorian Pod and we schedule Prolia injection since it was approved

## 2022-10-01 IMAGING — MG DIGITAL DIAGNOSTIC BILAT W/ TOMO W/ CAD
6 of 12 series · 6 of 36 positions shown · non-contrast
Comparison: Previous exam(s).

CLINICAL DATA: The patient bilateral shoulder pain which now
radiates into both breasts. The pain is nonfocal.

EXAM:
DIGITAL DIAGNOSTIC BILATERAL MAMMOGRAM WITH TOMOSYNTHESIS AND CAD
TECHNIQUE: Bilateral digital diagnostic mammography and breast tomosynthesis
was performed. The images were evaluated with computer-aided
detection.

[L CC synth-2D (1 of 2)]
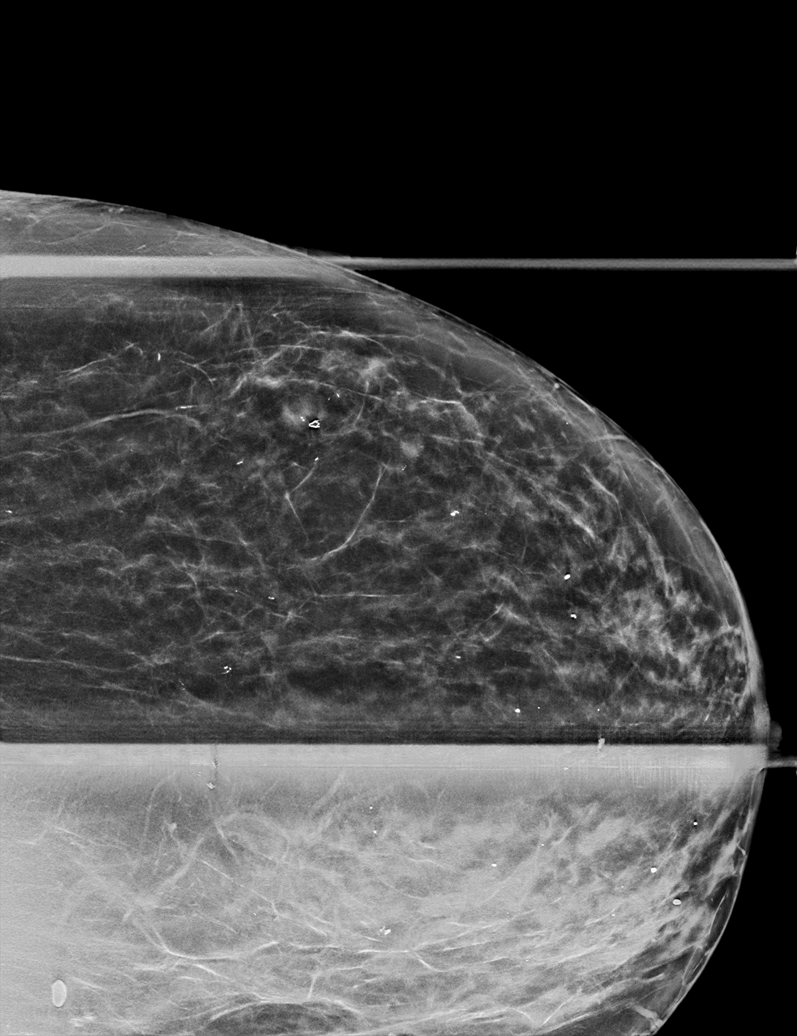

[R CC synth-2D]
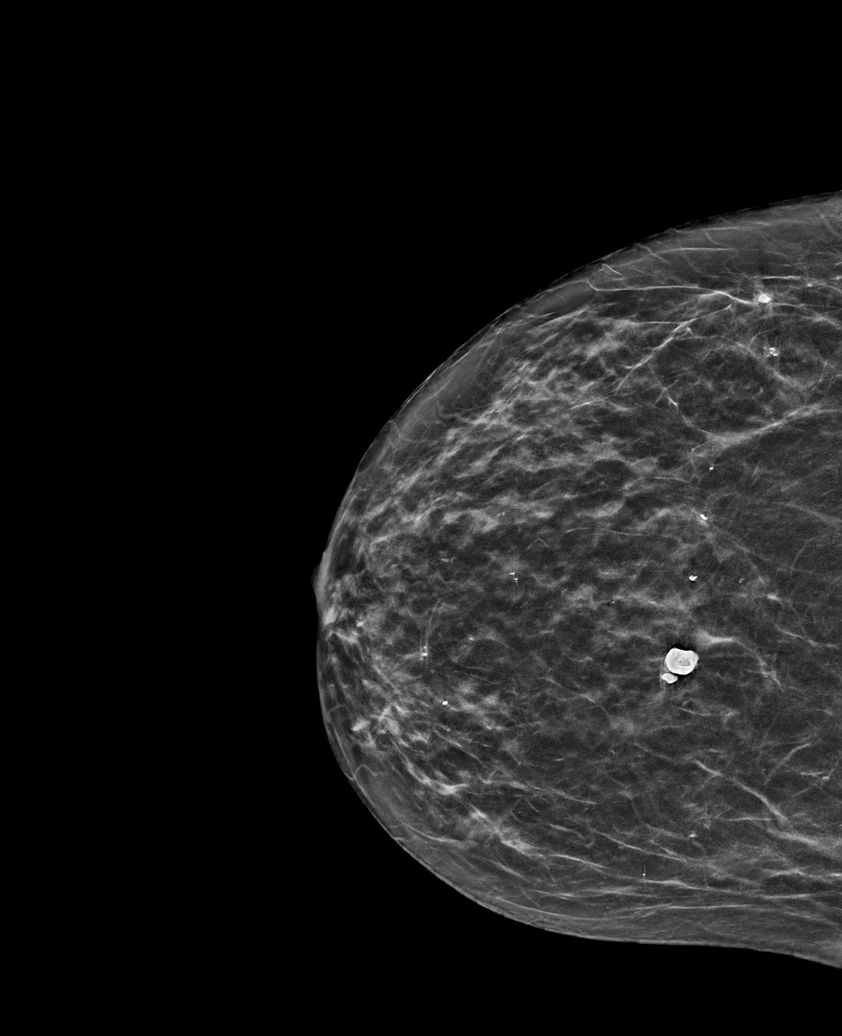

[L CC synth-2D (2 of 2)]
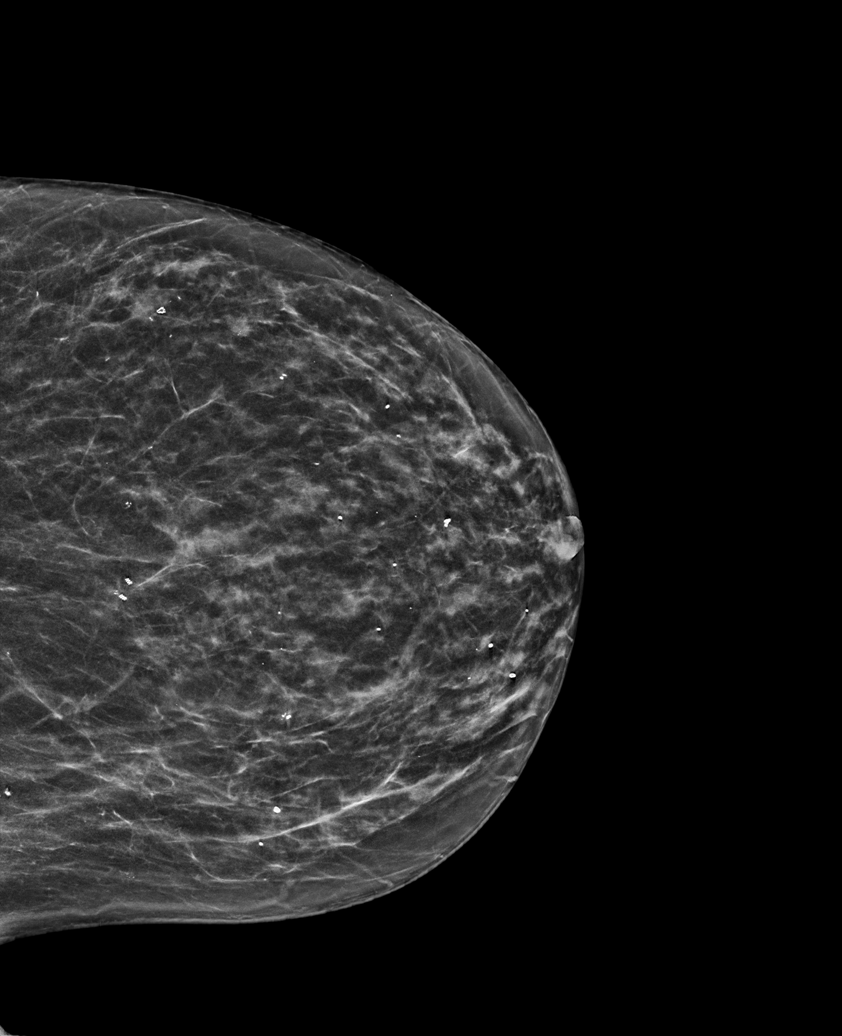

[L MLO synth-2D (1 of 2)]
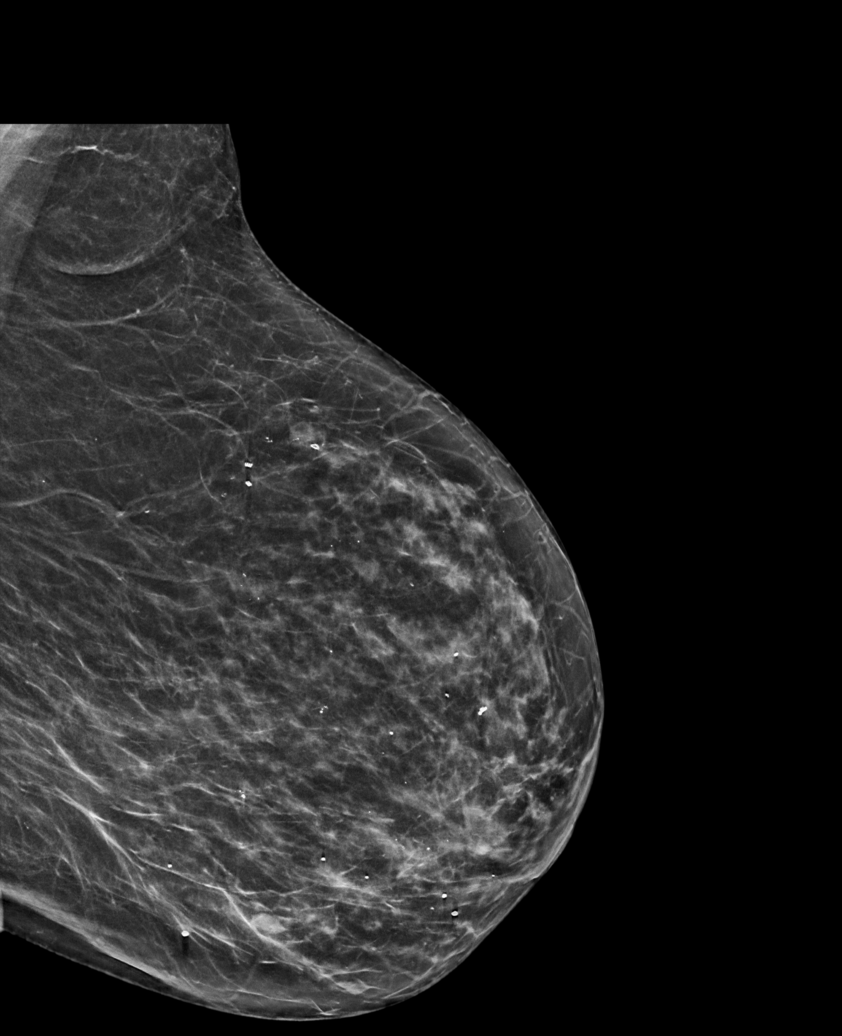

[R MLO synth-2D]
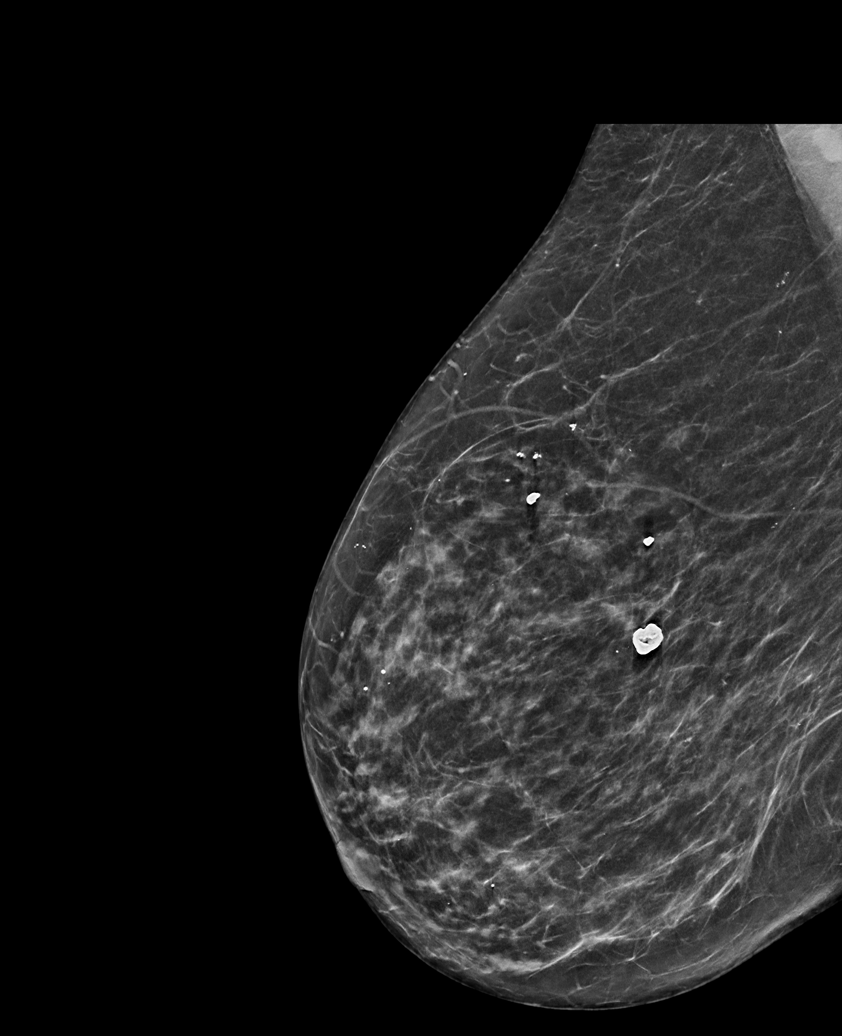

[L MLO synth-2D (2 of 2)]
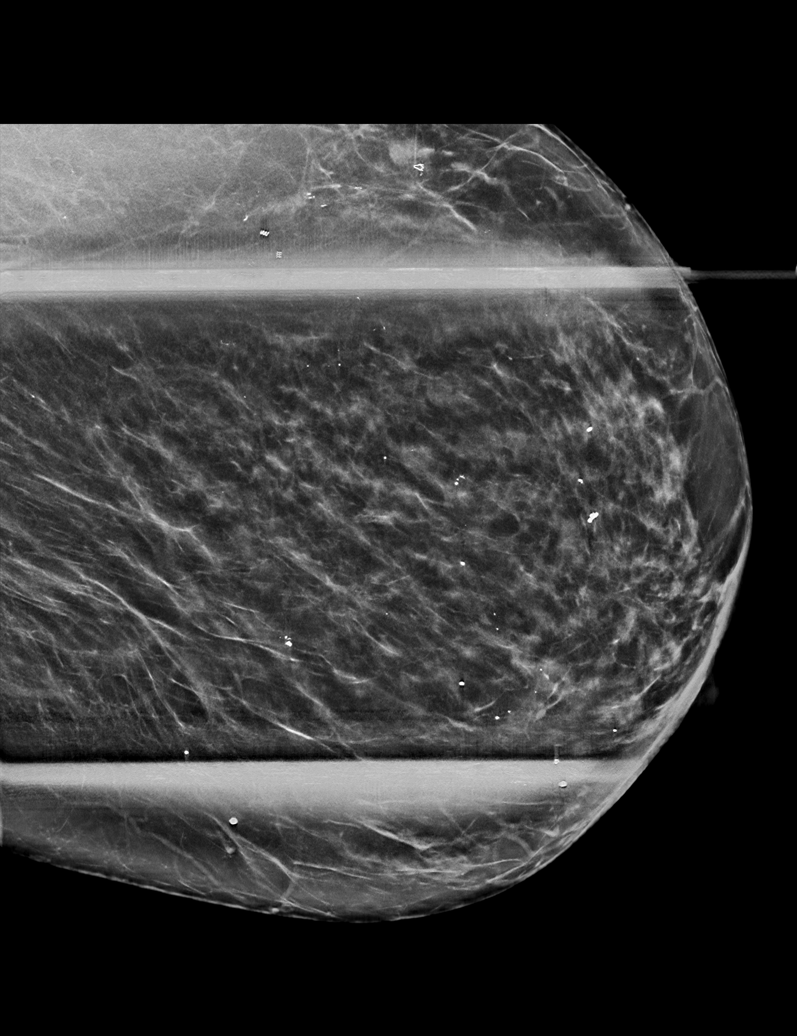

[6 of 36 positions shown; findings below may reference images not displayed]

ACR Breast Density Category b: There are scattered areas of
fibroglandular density.
FINDINGS: No suspicious masses, calcifications, or distortion are identified
in either breast. No interval changes.
IMPRESSION: No mammographic evidence of malignancy.

RECOMMENDATION:
Treatment of the patient's symptoms should be based on clinical and
physical exam given lack of imaging findings. Recommend annual
screening mammography.

I have discussed the findings and recommendations with the patient.
If applicable, a reminder letter will be sent to the patient
regarding the next appointment.

BI-RADS CATEGORY  2: Benign.

## 2022-10-02 ENCOUNTER — Ambulatory Visit (INDEPENDENT_AMBULATORY_CARE_PROVIDER_SITE_OTHER): Payer: Medicare Other

## 2022-10-02 VITALS — BP 110/80 | HR 64 | Temp 98.6°F | Ht 59.0 in | Wt 144.0 lb

## 2022-10-02 DIAGNOSIS — M810 Age-related osteoporosis without current pathological fracture: Secondary | ICD-10-CM

## 2022-10-02 MED ORDER — DENOSUMAB 60 MG/ML ~~LOC~~ SOSY
60.0000 mg | PREFILLED_SYRINGE | Freq: Once | SUBCUTANEOUS | Status: AC
  Administered 2022-10-02: 60 mg via SUBCUTANEOUS

## 2022-10-02 NOTE — Progress Notes (Signed)
Per order of Dr. Renold Genta patient received Prolia 60 mg/ml in left arm. Patient tolerated well. Vital signs are in within normal limits.

## 2022-10-02 NOTE — Patient Instructions (Addendum)
Prolia given without problems. RTC in 6 months for next injection.

## 2022-10-04 ENCOUNTER — Other Ambulatory Visit: Payer: Self-pay | Admitting: Internal Medicine

## 2022-10-05 NOTE — Telephone Encounter (Signed)
Rx refill sent to pharmacy. 

## 2022-10-23 DIAGNOSIS — M545 Low back pain, unspecified: Secondary | ICD-10-CM | POA: Diagnosis not present

## 2022-11-02 ENCOUNTER — Other Ambulatory Visit: Payer: Self-pay | Admitting: Urology

## 2022-11-02 ENCOUNTER — Telehealth: Payer: Self-pay | Admitting: *Deleted

## 2022-11-02 NOTE — Telephone Encounter (Signed)
Tried to call the pt again today, but her phone is still busy signal.

## 2022-11-02 NOTE — Telephone Encounter (Signed)
   Pre-operative Risk Assessment    Patient Name: Melissa [name]  DOB: 03-Apr-1931 MRN: [id number]      Request for Surgical Clearance    Procedure:   REMOVAL OF INTERSTIM DEVICE  Date of Surgery:  Clearance 11/17/22                                 Surgeon:  DR. MARYELLEN PACE Surgeon's Group or Practice Name:  [facility] Phone number:  [phone] Fax number:  [phone]   Type of Clearance Requested:   - Medical  - Pharmacy:  Hold Clopidogrel (Plavix)     Type of Anesthesia:  MAC   Additional requests/questions:    [name]   11/02/2022, 12:38 PM

## 2022-11-02 NOTE — Telephone Encounter (Signed)
I have attempted to contact this patient by phone, but line is busy. I will continue to try later.

## 2022-11-02 NOTE — Telephone Encounter (Signed)
Primary Jefferson, MD   Preoperative team, please contact this patient and set up a phone call appointment for further preoperative risk assessment. Please obtain consent and complete medication review. Thank you for your help.   Per note review, she takes Plavix for mild PAD.  Pending no concerning symptoms at time of virtual visit, she may hold Plavix for 5 days prior to procedure and should resume as soon as hemodynamically stable.  Emmaline Life, NP-C  11/02/2022, 1:10 PM [address], [address] Office [phone] Fax [phone]

## 2022-11-03 ENCOUNTER — Telehealth: Payer: Self-pay

## 2022-11-03 NOTE — Telephone Encounter (Signed)
Patient scheduled for telehealth visit on 4/1. Med rec and consent done

## 2022-11-03 NOTE — Telephone Encounter (Signed)
  Patient Consent for Virtual Visit         Melissa [name] has provided verbal consent on 11/03/2022 for a virtual visit (video or telephone).   CONSENT FOR VIRTUAL VISIT FOR:  Melissa [name]  By participating in this virtual visit I agree to the following:  I hereby voluntarily request, consent and authorize [facility] and its employed or contracted physicians, physician assistants, nurse practitioners or other licensed health care professionals (the Practitioner), to provide me with telemedicine health care services (the "Services") as deemed necessary by the treating Practitioner. I acknowledge and consent to receive the Services by the Practitioner via telemedicine. I understand that the telemedicine visit will involve communicating with the Practitioner through live audiovisual communication technology and the disclosure of certain medical information by electronic transmission. I acknowledge that I have been given the opportunity to request an in-person assessment or other available alternative prior to the telemedicine visit and am voluntarily participating in the telemedicine visit.  I understand that I have the right to withhold or withdraw my consent to the use of telemedicine in the course of my care at any time, without affecting my right to future care or treatment, and that the Practitioner or I may terminate the telemedicine visit at any time. I understand that I have the right to inspect all information obtained and/or recorded in the course of the telemedicine visit and may receive copies of available information for a reasonable fee.  I understand that some of the potential risks of receiving the Services via telemedicine include:  Delay or interruption in medical evaluation due to technological equipment failure or disruption; Information transmitted may not be sufficient (e.g. poor resolution of images) to allow for appropriate medical decision making by the  Practitioner; and/or  In rare instances, security protocols could fail, causing a breach of personal health information.  Furthermore, I acknowledge that it is my responsibility to provide information about my medical history, conditions and care that is complete and accurate to the best of my ability. I acknowledge that Practitioner's advice, recommendations, and/or decision may be based on factors not within their control, such as incomplete or inaccurate data provided by me or distortions of diagnostic images or specimens that may result from electronic transmissions. I understand that the practice of medicine is not an exact science and that Practitioner makes no warranties or guarantees regarding treatment outcomes. I acknowledge that a copy of this consent can be made available to me via my patient portal ([facility]), or I can request a printed copy by calling the office of [facility].    I understand that my insurance will be billed for this visit.   I have read or had this consent read to me. I understand the contents of this consent, which adequately explains the benefits and risks of the Services being provided via telemedicine.  I have been provided ample opportunity to ask questions regarding this consent and the Services and have had my questions answered to my satisfaction. I give my informed consent for the services to be provided through the use of telemedicine in my medical care

## 2022-11-05 NOTE — Patient Instructions (Addendum)
SURGICAL WAITING ROOM VISITATION  Patients having surgery or a procedure may have no more than 2 support people in the waiting area - these visitors may rotate.    Children under the age of 3 must have an adult with them who is not the patient.  Due to an increase in RSV and influenza rates and associated hospitalizations, children ages 33 and under may not visit patients in [place].  If the patient needs to stay at the hospital during part of their recovery, the visitor guidelines for inpatient rooms apply. Pre-op nurse will coordinate an appropriate time for 1 support person to accompany patient in pre-op.  This support person may not rotate.    Please refer to the [facility] website for the visitor guidelines for Inpatients (after your surgery is over and you are in a regular room).    Your procedure is scheduled on: 11/17/22   Report to [facility] Main Entrance    Report to admitting at 1:00 PM   Call this number if you have problems the morning of surgery [phone]   Do not eat food or drink liquids :After Midnight.          If you have questions, please contact your surgeon's office.   FOLLOW BOWEL PREP AND ANY ADDITIONAL PRE OP INSTRUCTIONS YOU RECEIVED FROM YOUR SURGEON'S OFFICE!!!     Oral Hygiene is also important to reduce your risk of infection.                                    Remember - BRUSH YOUR TEETH THE MORNING OF SURGERY WITH YOUR REGULAR TOOTHPASTE  DENTURES WILL BE REMOVED PRIOR TO SURGERY PLEASE DO NOT APPLY "Poly grip" OR ADHESIVES!!!   Take these medicines the morning of surgery with A SIP OF WATER: Alprazolam, Atorvastatin, Sertraline   These are anesthesia recommendations for holding your anticoagulants.  Please contact your prescribing physician to confirm IF it is safe to hold your anticoagulants for this length of time.   Eliquis Apixaban   72 hours   Xarelto Rivaroxaban   72 hours  Plavix Clopidogrel   120 hours   Pletal Cilostazol   120 hours                                You may not have any metal on your body including hair pins, jewelry, and body piercing             Do not wear make-up, lotions, powders, perfumes, or deodorant  Do not wear nail polish including gel and S&S, artificial/acrylic nails, or any other type of covering on natural nails including finger and toenails. If you have artificial nails, gel coating, etc. that needs to be removed by a nail salon please have this removed prior to surgery or surgery may need to be canceled/ delayed if the surgeon/ anesthesia feels like they are unable to be safely monitored.   Do not shave  48 hours prior to surgery.    Do not bring valuables to the hospital. [place].   Contacts, glasses, dentures or bridgework may not be worn into surgery.  DO NOT [place]. PHARMACY WILL DISPENSE MEDICATIONS LISTED ON  YOUR MEDICATION LIST TO YOU DURING YOUR ADMISSION [place]!    Patients discharged on the day of surgery will not be allowed to drive home.  Someone NEEDS to stay with you for the first 24 hours after anesthesia.   Special Instructions: Bring a copy of your healthcare power of attorney and living will documents the day of surgery if you haven't scanned them before.              Please read over the following fact sheets you were given: IF [place] [phone] Schneiders   If you received a COVID test during your pre-op visit  it is requested that you wear a mask when out in public, stay away from anyone that may not be feeling well and notify your surgeon if you develop symptoms. If you test positive for Covid or have been in contact with anyone that has tested positive in the last 10 days please notify you surgeon.    [facility] - Preparing for Surgery Before surgery, you can play an important role.   Because skin is not sterile, your skin needs to be as free of germs as possible.  You can reduce the number of germs on your skin by washing with CHG (chlorahexidine gluconate) soap before surgery.  CHG is an antiseptic cleaner which kills germs and bonds with the skin to continue killing germs even after washing. Please DO NOT use if you have an allergy to CHG or antibacterial soaps.  If your skin becomes reddened/irritated stop using the CHG and inform your nurse when you arrive at Short Stay. Do not shave (including legs and underarms) for at least 48 hours prior to the first CHG shower.  You may shave your face/neck.  Please follow these instructions carefully:  1.  Shower with CHG Soap the night before surgery and the  morning of surgery.  2.  If you choose to wash your hair, wash your hair first as usual with your normal  shampoo.  3.  After you shampoo, rinse your hair and body thoroughly to remove the shampoo.                             4.  Use CHG as you would any other liquid soap.  You can apply chg directly to the skin and wash.  Gently with a scrungie or clean washcloth.  5.  Apply the CHG Soap to your body ONLY FROM THE NECK DOWN.   Do   not use on face/ open                           Wound or open sores. Avoid contact with eyes, ears mouth and   genitals (private parts).                       Wash face,  Genitals (private parts) with your normal soap.             6.  Wash thoroughly, paying special attention to the area where your    surgery  will be performed.  7.  Thoroughly rinse your body with warm water from the neck down.  8.  DO NOT shower/wash with your normal soap after using and rinsing off the CHG Soap.  9.  Pat yourself dry with a clean towel.            10.  Wear clean pajamas.            11.  Place clean sheets on your bed the night of your first shower and do not  sleep with pets. Day of Surgery : Do not apply any lotions/deodorants the morning of  surgery.  Please wear clean clothes to the hospital/surgery center.  FAILURE TO FOLLOW THESE INSTRUCTIONS MAY RESULT IN THE CANCELLATION OF YOUR SURGERY  PATIENT SIGNATURE_________________________________  NURSE SIGNATURE__________________________________  ________________________________________________________________________

## 2022-11-05 NOTE — Progress Notes (Addendum)
COVID Vaccine Completed: yes  Date of COVID positive in last 90 days: no  PCP - Tedra Senegal, DM Cardiologist - Rudean Haskell, MD  Cardiac clearance by Ambrose Pancoast, NP 11/09/22 in Epic  Chest x-ray - n/a EKG - 07/01/22 Epic Stress Test - years ago per pt ECHO - 06/17/22 Epic Cardiac Cath - n/a Pacemaker/ICD device last checked: n/a Spinal Cord Stimulator: yes, getting removed  Bowel Prep -   Sleep Study -  CPAP -   Fasting Blood Sugar -  Checks Blood Sugar _____ times a day  Last dose of GLP1 agonist-  N/A GLP1 instructions:  N/A   Last dose of SGLT-2 inhibitors-  N/A SGLT-2 instructions: N/A   Blood Thinner Instructions: Plavix, hold 7 days per daughter  Aspirin Instructions: Last Dose: 11/09/22 0900  Activity level: Can perform activities of daily living without stopping and without symptoms of chest pain or shortness of breath. Ambulates short distances with cane other wise uses wheelchair    Anesthesia review: HTN, PAD, nonrhematic tricuspid valve,   Patient denies shortness of breath, fever, cough and chest pain at PAT appointment  Patient verbalized understanding of instructions that were given to them at the PAT appointment. Patient was also instructed that they will need to review over the PAT instructions again at home before surgery.

## 2022-11-09 ENCOUNTER — Other Ambulatory Visit: Payer: Self-pay | Admitting: Internal Medicine

## 2022-11-09 ENCOUNTER — Ambulatory Visit: Payer: Medicare Other | Attending: Internal Medicine

## 2022-11-09 DIAGNOSIS — Z01810 Encounter for preprocedural cardiovascular examination: Secondary | ICD-10-CM

## 2022-11-09 NOTE — Progress Notes (Signed)
Virtual Visit via Telephone Note   Because of [name]'s co-morbid illnesses, she is at least at moderate risk for complications without adequate follow up.  This format is felt to be most appropriate for this patient at this time.  The patient did not have access to video technology/had technical difficulties with video requiring transitioning to audio format only (telephone).  All issues noted in this document were discussed and addressed.  No physical exam could be performed with this format.  Please refer to the patient's chart for her consent to telehealth for [facility].  Evaluation Performed:  Preoperative cardiovascular risk assessment _____________   Date:  11/09/2022   Patient ID:  Melissa [name], DOB 1930/11/18, MRN [id number] Patient Location:  Home Provider location:   Office  Primary Care Provider:  Elby Showers, MD Primary Cardiologist:  Werner Lean, MD  Chief Complaint / Patient Profile   89 y.o. y/o female with a h/o HTN, HLD, Spinal stenosis and moderate TR who is pending removal of InterStim implant and presents today for telephonic preoperative cardiovascular risk assessment.  History of Present Illness    Melissa [name] is a 89 y.o. female who presents via audio/video conferencing for a telehealth visit today.  Pt was last seen in cardiology clinic on 07/01/22 by Dr. Gasper Sells.  At that time Melissa [name] was experiencing some lower extremity swelling and complaint of pain in the chest that was chronic.  The patient is now pending procedure as outlined above. Since her last visit, she has experienced ongoing musculoskeletal chest discomfort and has also experienced lower extremity swelling that is mainly due to inactivity.  She denies any shortness of breath or orthopnea.  She denies  shortness of breath, lower extremity edema, fatigue, palpitations, melena, hematuria, hemoptysis, diaphoresis, weakness, presyncope, syncope,  orthopnea, and PND.    Past Medical History    Past Medical History:  Diagnosis Date   Allergy    Alzheimer disease ([place])    Anxiety    Bladder dysfunction    Bulging lumbar disc    Depression    Hyperlipidemia    Hypertension    Low back pain    Lumbar radiculopathy    Spinal stenosis    Past Surgical History:  Procedure Laterality Date   bladder dysfunction     bladder stimulator     BREAST CYST EXCISION     CATARACT EXTRACTION      Allergies  Allergies  Allergen Reactions   Aspirin Nausea Only    Nausea, dizziness, malaise.   Codeine     Fatigue, GI Intolerance   Tramadol     fatigue    Home Medications    Prior to Admission medications   Medication Sig Start Date End Date Taking? Authorizing Provider  ALPRAZolam Duanne Moron) 0.5 MG tablet TAKE 1 TABLET BY MOUTH IN THE MORNING AND AT BEDTIME 08/16/22   Elby Showers, MD  atorvastatin (LIPITOR) 80 MG tablet Take 1 tablet (80 mg total) by mouth daily. 12/31/21   Werner Lean, MD  bacitracin-polymyxin b (POLYSPORIN) ophthalmic ointment Place 1 Application into the right eye 3 (three) times daily. 08/27/22   Elby Showers, MD  cholecalciferol (VITAMIN D) 1000 units tablet Take 1,000 Units by mouth daily.    [provider]  clopidogrel (PLAVIX) 75 MG tablet Take 1 tablet (75 mg total) by mouth daily. 10/05/22   Werner Lean, MD  furosemide (LASIX) 80 MG tablet Take 1 tablet (80 mg total)  by mouth daily. 12/22/21   Chandrasekhar, Terisa Starr, MD  Menthol, Topical Analgesic, (BIOFREEZE EX) Apply 1 Application topically daily as needed (pain).    [provider]  mirtazapine (REMERON) 15 MG tablet One half to one tablet at bedtime 01/06/22   Elby Showers, MD  Multiple Vitamins-Minerals (CENTRUM SILVER 50+WOMEN) TABS Take 1 tablet by mouth daily.    [provider]  potassium chloride SA (KLOR-CON M) 20 MEQ tablet Take 2 tablets (40 mEq total) by mouth daily. 12/22/21    Chandrasekhar, Lyda Kalata A, MD  sertraline (ZOLOFT) 100 MG tablet TAKE 1 TABLET(100 MG) BY MOUTH DAILY 10/10/20   Elby Showers, MD  TURMERIC PO Take 1 capsule by mouth daily.    [provider]  valsartan (DIOVAN) 80 MG tablet Take 1 tablet (80 mg total) by mouth daily. 07/21/21   Werner Lean, MD    Physical Exam    Vital Signs:  Melissa [name] does not have vital signs available for review today.  Given telephonic nature of communication, physical exam is limited. AAOx3. NAD. Normal affect.  Speech and respirations are unlabored.  Accessory Clinical Findings    None  Assessment & Plan    1.  Preoperative Cardiovascular Risk Assessment:  Ms. Beaston's perioperative risk of a major cardiac event is 0.9% according to the Revised Cardiac Risk Index (RCRI).  Therefore, she is at low risk for perioperative complications.   Her functional capacity is fair at 4.73 METs according to the [facility] Activity Status Index (DASI). Recommendations: According to ACC/AHA guidelines, no further cardiovascular testing needed.  The patient may proceed to surgery at acceptable risk.   Antiplatelet and/or Anticoagulation Recommendations: Clopidogrel (Plavix) can be held for 5 days prior to her surgery and resumed as soon as possible post op.   The patient was advised that if she develops new symptoms prior to surgery to contact our office to arrange for a follow-up visit, and she verbalized understanding.  A copy of this note will be routed to requesting surgeon.  Time:   Today, I have spent 9 minutes with the patient with telehealth technology discussing medical history, symptoms, and management plan.     Mable Fill, Marissa Nestle, NP  11/09/2022, 8:06 AM

## 2022-11-10 ENCOUNTER — Other Ambulatory Visit: Payer: Self-pay

## 2022-11-10 ENCOUNTER — Encounter (HOSPITAL_COMMUNITY): Payer: Self-pay

## 2022-11-10 ENCOUNTER — Encounter (HOSPITAL_COMMUNITY)
Admission: RE | Admit: 2022-11-10 | Discharge: 2022-11-10 | Disposition: A | Discharge status: Home / Self Care | Payer: Medicare Other | Source: Ambulatory Visit | Attending: Urology | Admitting: Urology

## 2022-11-10 VITALS — BP 129/73 | HR 88 | Temp 98.7°F | Resp 14 | Ht <= 58 in | Wt 142.0 lb

## 2022-11-10 DIAGNOSIS — M549 Dorsalgia, unspecified: Secondary | ICD-10-CM | POA: Diagnosis not present

## 2022-11-10 DIAGNOSIS — Z01812 Encounter for preprocedural laboratory examination: Secondary | ICD-10-CM | POA: Insufficient documentation

## 2022-11-10 DIAGNOSIS — N319 Neuromuscular dysfunction of bladder, unspecified: Secondary | ICD-10-CM | POA: Diagnosis not present

## 2022-11-10 DIAGNOSIS — I10 Essential (primary) hypertension: Secondary | ICD-10-CM | POA: Insufficient documentation

## 2022-11-10 LAB — BASIC METABOLIC PANEL
Anion gap: 8 (ref 5–15)
BUN: 25 mg/dL — ABNORMAL HIGH (ref 8–23)
CO2: 23 mmol/L (ref 22–32)
Calcium: 8.7 mg/dL — ABNORMAL LOW (ref 8.9–10.3)
Chloride: 107 mmol/L (ref 98–111)
Creatinine, Ser: 0.95 mg/dL (ref 0.44–1.00)
GFR, Estimated: 57 mL/min — ABNORMAL LOW (ref >60)
Glucose, Bld: 82 mg/dL (ref 70–99)
Potassium: 4.7 mmol/L (ref 3.5–5.1)
Sodium: 138 mmol/L (ref 135–145)

## 2022-11-10 LAB — CBC
HCT: 37.2 % (ref 36.0–46.0)
Hemoglobin: 12 g/dL (ref 12.0–15.0)
MCH: 27.6 pg (ref 26.0–34.0)
MCHC: 32.3 g/dL (ref 30.0–36.0)
MCV: 85.7 fL (ref 80.0–100.0)
Platelets: 207 10*3/uL (ref 150–400)
RBC: 4.34 MIL/uL (ref 3.87–5.11)
RDW: 13.9 % (ref 11.5–15.5)
WBC: 4.4 10*3/uL (ref 4.0–10.5)
nRBC: 0 % (ref 0.0–0.2)

## 2022-11-11 NOTE — Progress Notes (Signed)
Anesthesia Chart Review   Case: [id number] Date/Time: 11/17/22 1500   Procedure: REMOVAL OF INTERSTIM IMPLANT - 60 MINUTES   Anesthesia type: Monitor Anesthesia Care   Pre-op diagnosis: BACK PAIN   Location: [facility]   Surgeons: Robley Fries, MD       DISCUSSION:89 y.o. never smoker with h/o HTN, moderate TR, bladder dysfunction, back pain scheduled for above procedure 11/17/2022 with Dr. Jacalyn Lefevre.   Per cardiology preoperative evaluation 11/09/2022, "Ms. Mareno's perioperative risk of a major cardiac event is 0.9% according to the Revised Cardiac Risk Index (RCRI).  Therefore, she is at low risk for perioperative complications.   Her functional capacity is fair at 4.73 METs according to the Duke Activity Status Index (DASI). Recommendations: According to ACC/AHA guidelines, no further cardiovascular testing needed.  The patient may proceed to surgery at acceptable risk.   Antiplatelet and/or Anticoagulation Recommendations: Clopidogrel (Plavix) can be held for 5 days prior to her surgery and resumed as soon as possible post op."  Pt reports last dose of Plavix 11/09/2022.   Anticipate pt can proceed with planned procedure barring acute status change.   VS: BP 129/73   Pulse 88   Temp 37.1 C   Resp 14   Ht 4\' 10"  (1.473 m)   Wt 64.4 kg   SpO2 98%   BMI 29.68 kg/m   PROVIDERS: Elby Showers, MD is PCP   Cardiologist - Rudean Haskell, MD  LABS: Labs reviewed: Acceptable for surgery. (all labs ordered are listed, but only abnormal results are displayed)  Labs Reviewed  BASIC METABOLIC PANEL - Abnormal; Notable for the following components:      Result Value   BUN 25 (*)    Calcium 8.7 (*)    GFR, Estimated 57 (*)    All other components within normal limits  CBC     IMAGES:   EKG:   CV: Echo 06/17/2022 1. Left ventricular ejection fraction, by estimation, is 55 to 60%. The  left ventricle has normal function. The left ventricle  has no regional  wall motion abnormalities. Left ventricular diastolic parameters are  consistent with Grade I diastolic  dysfunction (impaired relaxation).   2. Right ventricular systolic function is normal. The right ventricular  size is moderately enlarged. There is mildly elevated pulmonary artery  systolic pressure.   3. Right atrial size was moderately dilated.   4. The mitral valve is normal in structure. Trivial mitral valve  regurgitation. No evidence of mitral stenosis.   5. Tricuspid valve regurgitation is moderate.   6. The aortic valve is tricuspid. Aortic valve regurgitation is not  visualized. No aortic stenosis is present.   7. The inferior vena cava is normal in size with greater than 50%  respiratory variability, suggesting right atrial pressure of 3 mmHg.   Past Medical History:  Diagnosis Date   Allergy    Alzheimer disease    Anxiety    Bladder dysfunction    Bulging lumbar disc    Depression    Hyperlipidemia    Hypertension    Low back pain    Lumbar radiculopathy    Spinal stenosis     Past Surgical History:  Procedure Laterality Date   bladder dysfunction     bladder stimulator     BREAST CYST EXCISION     CATARACT EXTRACTION     ROTATOR CUFF REPAIR Right    WISDOM TOOTH EXTRACTION      MEDICATIONS:  ALPRAZolam (XANAX) 0.5 MG tablet   atorvastatin (LIPITOR) 80 MG tablet   bacitracin-polymyxin b (POLYSPORIN) ophthalmic ointment   cholecalciferol (VITAMIN D) 1000 units tablet   clopidogrel (PLAVIX) 75 MG tablet   furosemide (LASIX) 80 MG tablet   Menthol, Topical Analgesic, (BIOFREEZE EX)   mirtazapine (REMERON) 15 MG tablet   Multiple Vitamins-Minerals (CENTRUM SILVER 50+WOMEN) TABS   potassium chloride SA (KLOR-CON M) 20 MEQ tablet   sertraline (ZOLOFT) 100 MG tablet   TURMERIC PO   valsartan (DIOVAN) 80 MG tablet   No current facility-administered medications for this encounter.   Konrad Felix Ward, PA-C [facility]  Testing [phone]

## 2022-11-11 NOTE — Anesthesia Preprocedure Evaluation (Addendum)
Anesthesia Evaluation  Patient identified by MRN, date of birth, ID band Patient awake    Reviewed: Allergy & Precautions, NPO status , Patient's Chart, lab work & pertinent test results  Airway Mallampati: II  TM Distance: >3 FB Neck ROM: Full    Dental no notable dental hx.    Pulmonary neg pulmonary ROS   Pulmonary exam normal        Cardiovascular hypertension, + Peripheral Vascular Disease  Normal cardiovascular exam+ Valvular Problems/Murmurs   tricuspid valve regurgitation   Neuro/Psych  PSYCHIATRIC DISORDERS Anxiety Depression   Dementia negative neurological ROS     GI/Hepatic negative GI ROS, Neg liver ROS,,,  Endo/Other  negative endocrine ROS    Renal/GU negative Renal ROS     Musculoskeletal Spinal stenosis   Abdominal   Peds  Hematology  (+) Blood dyscrasia (Plavix)   Anesthesia Other Findings BACK PAIN  Reproductive/Obstetrics                             Anesthesia Physical Anesthesia Plan  ASA: 3  Anesthesia Plan: MAC   Post-op Pain Management:    Induction: Intravenous  PONV Risk Score and Plan: 2 and Ondansetron, Dexamethasone and Treatment may vary due to age or medical condition  Airway Management Planned: Nasal Cannula  Additional Equipment:   Intra-op Plan:   Post-operative Plan:   Informed Consent: I have reviewed the patients History and Physical, chart, labs and discussed the procedure including the risks, benefits and alternatives for the proposed anesthesia with the patient or authorized representative who has indicated his/her understanding and acceptance.     Dental advisory given  Plan Discussed with: CRNA and Surgeon  Anesthesia Plan Comments: (PAT note 11/10/22)       Anesthesia Quick Evaluation

## 2022-11-13 NOTE — H&P (Signed)
CC/HPI: cc: Back pain   10/23/22: 89 year old woman with a history of InterStim device [place] in [place] comes in with pain over battery site. This has been ongoing for 1 year and very bothersome to her. Patient states InterStim never worked and it was turned off shortly after it was placed due to vibrations in her pelvis. She denies any difficulty with urination or bothersome urinary symptoms at the moment. She is here today with her daughter Alvino Chapelllen. She is on Plavix. She also has spinal stenosis and fairly significant back pain.     ALLERGIES: Aspirin Tramadol Hcl    MEDICATIONS: Plavix 75 mg tablet  Alprazolam 0.5 mg tablet  Atorvastatin Calcium 80 mg tablet  Furosemide 80 mg tablet  Mirtazapine 15 mg tablet  Potassium Chloride  Sertraline Hcl 100 mg tablet  Turmeric  Valsartan  Vitamin D     GU PSH: None     PSH Notes: Bladder stimulator, bladder dysfunction, bladder cyst    NON-GU PSH: Cataract surgery     GU PMH: None   NON-GU PMH: Anxiety Arthritis Depression Hypercholesterolemia Hypertension    FAMILY HISTORY: No Family History    SOCIAL HISTORY: Marital Status: Widowed Ethnicity: Not Hispanic Or Latino; Race: Black or African American Current Smoking Status: Patient has never smoked.   Tobacco Use Assessment Completed: Used Tobacco in last 30 days? Does not use smokeless tobacco. Has never drank.  Drinks 1 caffeinated drink per day. Patient's occupation is/was retired.    REVIEW OF SYSTEMS:    GU Review Female:   Patient reports frequent urination and get up at night to urinate. Patient denies hard to postpone urination, burning /pain with urination, leakage of urine, stream starts and stops, trouble starting your stream, have to strain to urinate, and being pregnant.  Gastrointestinal (Upper):   Patient denies nausea, vomiting, and indigestion/ heartburn.  Gastrointestinal (Lower):   Patient denies diarrhea and constipation.  Constitutional:    Patient denies fever, night sweats, weight loss, and fatigue.  Skin:   Patient denies skin rash/ lesion and itching.  Eyes:   Patient denies blurred vision and double vision.  Ears/ Nose/ Throat:   Patient denies sore throat and sinus problems.  Hematologic/Lymphatic:   Patient denies swollen glands and easy bruising.  Cardiovascular:   Patient reports leg swelling. Patient denies chest pains.  Respiratory:   Patient reports shortness of breath. Patient denies cough.  Endocrine:   Patient denies excessive thirst.  Musculoskeletal:   Patient reports back pain and joint pain.   Neurological:   Patient denies headaches and dizziness.  Psychologic:   Patient reports depression and anxiety.    VITAL SIGNS:      10/23/2022 12:45 PM  Weight 141 lb / 63.96 kg  Height 59 in / 149.86 cm  BP 104/60 mmHg  Pulse 90 /min  BMI 28.5 kg/m   MULTI-SYSTEM PHYSICAL EXAMINATION:    Constitutional: Well-nourished. No physical deformities. Normally developed. Good grooming.  Neck: Neck symmetrical, not swollen. Normal tracheal position.  Respiratory: No labored breathing, no use of accessory muscles.   Skin: No paleness, no jaundice, no cyanosis. No lesion, no ulcer, no rash.  Neurologic / Psychiatric: Oriented to time, oriented to place, oriented to person. No depression, no anxiety, no agitation.  Eyes: Normal conjunctivae. Normal eyelids.  Ears, Nose, Mouth, and Throat: Left ear no scars, no lesions, no masses. Right ear no scars, no lesions, no masses. Nose no scars, no lesions, no masses. Normal hearing. Normal lips.  Musculoskeletal: Normal gait and station of head and neck.   Notes: point tenderness over scar on right lower back where battery pack is, no erythema/fluctuance/induration     Complexity of Data:  Records Review:   Previous Patient Records, POC Tool  Urine Test Review:   Urinalysis  Notes:                     07/01/2022: BUN 19, creatinine 1.01, EGFR 53   PROCEDURES: None    ASSESSMENT:      ICD-10 Details  1 GU:   Low back pain - M54.5 Chronic, Worsening   PLAN:           Document Letter(s):  Created for Patient: Clinical Summary         Notes:   Low back pain:  -discussed removal of interstim device under MAC with local  -risks and benefits of removal of device discussed with patient and her daughter in detail including but not limited to pain, bleeding, infection, inability remove all of the parts, risk of anesthesia, damage to surrounding structures, worsening pain  -Will schedule under MAC with local

## 2022-11-17 ENCOUNTER — Ambulatory Visit (HOSPITAL_COMMUNITY): Payer: Medicare Other | Admitting: Physician Assistant

## 2022-11-17 ENCOUNTER — Encounter (HOSPITAL_COMMUNITY): Payer: Self-pay | Admitting: Urology

## 2022-11-17 ENCOUNTER — Ambulatory Visit (HOSPITAL_COMMUNITY)
Admission: RE | Admit: 2022-11-17 | Discharge: 2022-11-17 | Disposition: A | Discharge status: Home / Self Care | Payer: Medicare Other | Attending: Urology | Admitting: Urology

## 2022-11-17 ENCOUNTER — Other Ambulatory Visit: Payer: Self-pay

## 2022-11-17 ENCOUNTER — Encounter (HOSPITAL_COMMUNITY)
Admission: RE | Disposition: A | Discharge status: Home / Self Care | Payer: Self-pay | Source: Home / Self Care | Attending: Urology

## 2022-11-17 ENCOUNTER — Ambulatory Visit (HOSPITAL_BASED_OUTPATIENT_CLINIC_OR_DEPARTMENT_OTHER): Payer: Medicare Other | Admitting: Anesthesiology

## 2022-11-17 ENCOUNTER — Ambulatory Visit (HOSPITAL_COMMUNITY): Payer: Medicare Other

## 2022-11-17 DIAGNOSIS — G8928 Other chronic postprocedural pain: Secondary | ICD-10-CM | POA: Insufficient documentation

## 2022-11-17 DIAGNOSIS — Z9682 Presence of neurostimulator: Secondary | ICD-10-CM | POA: Diagnosis not present

## 2022-11-17 DIAGNOSIS — M545 Low back pain, unspecified: Secondary | ICD-10-CM | POA: Diagnosis not present

## 2022-11-17 DIAGNOSIS — I739 Peripheral vascular disease, unspecified: Secondary | ICD-10-CM | POA: Diagnosis not present

## 2022-11-17 DIAGNOSIS — Z7902 Long term (current) use of antithrombotics/antiplatelets: Secondary | ICD-10-CM | POA: Insufficient documentation

## 2022-11-17 DIAGNOSIS — T85840A Pain due to nervous system prosthetic devices, implants and grafts, initial encounter: Secondary | ICD-10-CM | POA: Insufficient documentation

## 2022-11-17 DIAGNOSIS — I10 Essential (primary) hypertension: Secondary | ICD-10-CM | POA: Diagnosis not present

## 2022-11-17 DIAGNOSIS — Y752 Prosthetic and other implants, materials and neurological devices associated with adverse incidents: Secondary | ICD-10-CM | POA: Diagnosis not present

## 2022-11-17 DIAGNOSIS — F418 Other specified anxiety disorders: Secondary | ICD-10-CM | POA: Diagnosis not present

## 2022-11-17 DIAGNOSIS — T85193A Other mechanical complication of implanted electronic neurostimulator, generator, initial encounter: Secondary | ICD-10-CM | POA: Diagnosis not present

## 2022-11-17 DIAGNOSIS — T85191A Other mechanical complication of implanted electronic neurostimulator (electrode) of peripheral nerve, initial encounter: Secondary | ICD-10-CM | POA: Diagnosis not present

## 2022-11-17 DIAGNOSIS — D759 Disease of blood and blood-forming organs, unspecified: Secondary | ICD-10-CM | POA: Diagnosis not present

## 2022-11-17 HISTORY — PX: INTERSTIM IMPLANT REMOVAL: SHX5131

## 2022-11-17 SURGERY — REMOVAL, NEUROSTIMULATOR, SACRAL
Anesthesia: Monitor Anesthesia Care

## 2022-11-17 MED ORDER — DEXAMETHASONE SODIUM PHOSPHATE 10 MG/ML IJ SOLN
INTRAMUSCULAR | Status: AC
  Filled 2022-11-17: qty 1

## 2022-11-17 MED ORDER — AMISULPRIDE (ANTIEMETIC) 5 MG/2ML IV SOLN: 10.0000 mg | Freq: Once | INTRAVENOUS | Status: DC | PRN

## 2022-11-17 MED ORDER — ACETAMINOPHEN 10 MG/ML IV SOLN: 1000.0000 mg | Freq: Once | INTRAVENOUS | Status: DC | PRN

## 2022-11-17 MED ORDER — DEXAMETHASONE SODIUM PHOSPHATE 10 MG/ML IJ SOLN
INTRAMUSCULAR | Status: DC | PRN
  Administered 2022-11-17: 4 mg via INTRAVENOUS

## 2022-11-17 MED ORDER — FENTANYL CITRATE (PF) 100 MCG/2ML IJ SOLN
INTRAMUSCULAR | Status: DC | PRN
  Administered 2022-11-17 (×2): 25 ug via INTRAVENOUS

## 2022-11-17 MED ORDER — LACTATED RINGERS IV SOLN: INTRAVENOUS | Status: DC

## 2022-11-17 MED ORDER — CEFAZOLIN SODIUM-DEXTROSE 2-4 GM/100ML-% IV SOLN
2.0000 g | INTRAVENOUS | Status: AC
  Administered 2022-11-17: 2 g via INTRAVENOUS
  Filled 2022-11-17: qty 100

## 2022-11-17 MED ORDER — PROPOFOL 500 MG/50ML IV EMUL
INTRAVENOUS | Status: DC | PRN
  Administered 2022-11-17: 60 ug/kg/min via INTRAVENOUS

## 2022-11-17 MED ORDER — PHENYLEPHRINE HCL (PRESSORS) 10 MG/ML IV SOLN
INTRAVENOUS | Status: DC | PRN
  Administered 2022-11-17 (×5): 80 ug via INTRAVENOUS

## 2022-11-17 MED ORDER — CHLORHEXIDINE GLUCONATE 0.12 % MT SOLN
15.0000 mL | Freq: Once | OROMUCOSAL | Status: AC
  Administered 2022-11-17: 15 mL via OROMUCOSAL

## 2022-11-17 MED ORDER — PROPOFOL 10 MG/ML IV BOLUS
INTRAVENOUS | Status: AC
  Filled 2022-11-17: qty 20

## 2022-11-17 MED ORDER — LIDOCAINE HCL (PF) 2 % IJ SOLN
INTRAMUSCULAR | Status: DC | PRN
  Administered 2022-11-17: 60 mg via INTRADERMAL

## 2022-11-17 MED ORDER — BUPIVACAINE HCL (PF) 0.25 % IJ SOLN
INTRAMUSCULAR | Status: DC | PRN
  Administered 2022-11-17: 8 mL

## 2022-11-17 MED ORDER — ORAL CARE MOUTH RINSE: 15.0000 mL | Freq: Once | OROMUCOSAL | Status: AC

## 2022-11-17 MED ORDER — FENTANYL CITRATE (PF) 100 MCG/2ML IJ SOLN
INTRAMUSCULAR | Status: AC
  Filled 2022-11-17: qty 2

## 2022-11-17 MED ORDER — FENTANYL CITRATE PF 50 MCG/ML IJ SOSY: 25.0000 ug | PREFILLED_SYRINGE | INTRAMUSCULAR | Status: DC | PRN

## 2022-11-17 MED ORDER — ONDANSETRON HCL 4 MG/2ML IJ SOLN: 4.0000 mg | Freq: Once | INTRAMUSCULAR | Status: DC | PRN

## 2022-11-17 MED ORDER — PROPOFOL 500 MG/50ML IV EMUL
INTRAVENOUS | Status: AC
  Filled 2022-11-17: qty 50

## 2022-11-17 MED ORDER — BUPIVACAINE HCL 0.25 % IJ SOLN
INTRAMUSCULAR | Status: AC
  Filled 2022-11-17: qty 1

## 2022-11-17 MED ORDER — 0.9 % SODIUM CHLORIDE (POUR BTL) OPTIME
TOPICAL | Status: DC | PRN
  Administered 2022-11-17: 1000 mL

## 2022-11-17 MED ORDER — LIDOCAINE HCL 1 % IJ SOLN
INTRAMUSCULAR | Status: DC | PRN
  Administered 2022-11-17: 8 mL

## 2022-11-17 MED ORDER — ONDANSETRON HCL 4 MG/2ML IJ SOLN
INTRAMUSCULAR | Status: DC | PRN
  Administered 2022-11-17: 4 mg via INTRAVENOUS

## 2022-11-17 MED ORDER — ONDANSETRON HCL 4 MG/2ML IJ SOLN
INTRAMUSCULAR | Status: AC
  Filled 2022-11-17: qty 2

## 2022-11-17 MED ORDER — LIDOCAINE HCL (PF) 1 % IJ SOLN
INTRAMUSCULAR | Status: AC
  Filled 2022-11-17: qty 30

## 2022-11-17 SURGICAL SUPPLY — 47 items
ADH SKN CLS APL DERMABOND .7 (GAUZE/BANDAGES/DRESSINGS) ×1
ANTNA NRSTM XTRN TELEM NS LF (UROLOGICAL SUPPLIES)
APL SKNCLS STERI-STRIP NONHPOA (GAUZE/BANDAGES/DRESSINGS)
BAG COUNTER SPONGE SURGICOUNT (BAG) IMPLANT
BAG SPNG CNTER NS LX DISP (BAG)
BAG URINE LEG 500ML (DRAIN) IMPLANT
BENZOIN TINCTURE PRP APPL 2/3 (GAUZE/BANDAGES/DRESSINGS) IMPLANT
BLADE HEX COATED 2.75 (ELECTRODE) ×1 IMPLANT
BLADE SURG 15 STRL LF DISP TIS (BLADE) ×1 IMPLANT
BLADE SURG 15 STRL SS (BLADE) ×1
BNDG ADH 1X3 SHEER STRL LF (GAUZE/BANDAGES/DRESSINGS) ×2 IMPLANT
BNDG ADH THN 3X1 STRL LF (GAUZE/BANDAGES/DRESSINGS)
CATH FOLEY 2WAY SLVR  5CC 16FR (CATHETERS)
CATH FOLEY 2WAY SLVR 5CC 16FR (CATHETERS) IMPLANT
COVER BACK TABLE 60X90IN (DRAPES) ×1 IMPLANT
COVER MAYO STAND STRL (DRAPES) ×1 IMPLANT
COVER PROBE U/S 5X48 (MISCELLANEOUS) ×1 IMPLANT
COVER SURGICAL LIGHT HANDLE (MISCELLANEOUS) ×1 IMPLANT
DERMABOND ADVANCED .7 DNX12 (GAUZE/BANDAGES/DRESSINGS) IMPLANT
DRAPE INCISE 23X17 STRL (DRAPES) ×1 IMPLANT
DRAPE INCISE IOBAN 23X17 STRL (DRAPES) IMPLANT
DRAPE LAPAROSCOPIC ABDOMINAL (DRAPES) ×1 IMPLANT
DRSG TEGADERM 4X4.75 (GAUZE/BANDAGES/DRESSINGS) ×1 IMPLANT
DRSG TELFA 3X8 NADH STRL (GAUZE/BANDAGES/DRESSINGS) ×1 IMPLANT
ELECT REM PT RETURN 15FT ADLT (MISCELLANEOUS) IMPLANT
GAUZE SPONGE 4X4 12PLY STRL (GAUZE/BANDAGES/DRESSINGS) ×2 IMPLANT
GLOVE BIO SURGEON STRL SZ7.5 (GLOVE) ×1 IMPLANT
GOWN STRL REUS W/ TWL XL LVL3 (GOWN DISPOSABLE) ×1 IMPLANT
GOWN STRL REUS W/TWL XL LVL3 (GOWN DISPOSABLE)
KIT BASIN OR (CUSTOM PROCEDURE TRAY) ×1 IMPLANT
KIT TURNOVER KIT A (KITS) IMPLANT
NDL FORAMN 5 20GA (NEUROSURGERY SUPPLIES) IMPLANT
NDL HYPO 22X1.5 SAFETY MO (MISCELLANEOUS) IMPLANT
NEEDLE FORAMN 5 20GA (NEUROSURGERY SUPPLIES) IMPLANT
NEEDLE HYPO 22X1.5 SAFETY MO (MISCELLANEOUS) ×1 IMPLANT
PACK GENERAL/GYN (CUSTOM PROCEDURE TRAY) IMPLANT
PENCIL SMOKE EVACUATOR (MISCELLANEOUS) IMPLANT
PROGRAMMER ANTENNA EXT (UROLOGICAL SUPPLIES) ×1 IMPLANT
PROGRAMMER STIMUL 2.2X1.1X3.7 (UROLOGICAL SUPPLIES) ×1 IMPLANT
STAPLER VISISTAT 35W (STAPLE) IMPLANT
STRIP CLOSURE SKIN 1/2X4 (GAUZE/BANDAGES/DRESSINGS) ×1 IMPLANT
SUT VIC AB 3-0 SH 27 (SUTURE)
SUT VIC AB 3-0 SH 27X BRD (SUTURE) ×1 IMPLANT
SUT VICRYL RAPIDE 4/0 PS 2 (SUTURE) ×1 IMPLANT
SYR BULB IRRIG 60ML STRL (SYRINGE) ×1 IMPLANT
SYR CONTROL 10ML LL (SYRINGE) IMPLANT
TOWEL OR 17X26 10 PK STRL BLUE (TOWEL DISPOSABLE) ×2 IMPLANT

## 2022-11-17 NOTE — Discharge Instructions (Signed)
Removal of InterStim  Wound care: You have an incision with dissolvable sutures and super glue on top.  You can take a shower on 4/11.  No tub baths for 2-3 weeks until fully healed.  Call your doctor for: Fever is greater than 100.5 Severe nausea or vomiting Increasing pain not controlled by pain medication Increasing redness or drainage from incisions  The number for questions or concerns is [phone]  Activity level: No lifting greater than 10 pounds (about equal to milk) for the next 2 weeks or until cleared to do so at follow-up appointment.  Otherwise activity as tolerated by comfort level.  Diet: May resume your regular diet as tolerated  Driving: No driving while still taking opiate pain medications (weight at least 6-8 hours after last dose).  No driving if you still sore from surgery as it may limit her ability to react quickly if necessary.   Shower/bath: May shower and get incision wet pad dry immediately following.  Do not scrub vigorously for the next 2-3 weeks.  Do not soak incision (ID soaking in bath or swimming) until told he may do so by Dr., as this may promote a wound infection.   Follow-up appointments: Follow-up appointment will be scheduled with Dr. Arita Miss in 2 weeks.

## 2022-11-17 NOTE — Anesthesia Procedure Notes (Signed)
Procedure Name: MAC Date/Time: 11/17/2022 3:07 PM  Performed by: Johnette Abraham, CRNAPre-anesthesia Checklist: Patient identified, Emergency Drugs available, Suction available, Patient being monitored and Timeout performed Oxygen Delivery Method: Simple face mask Placement Confirmation: positive ETCO2

## 2022-11-17 NOTE — Op Note (Signed)
Operative Note  Preoperative diagnosis:  1.  Right low back pain/pain from Interstim device  Postoperative diagnosis: 1.  Right low back pain/pain from Interstim device  Procedure(s): 1.  Removal of Interstim device  Surgeon: Kasandra Knudsen, MD  Assistants:  None  Anesthesia:  MAC and local  Complications:  None  EBL:  minimal  Specimens: 1. None  Drains/Catheters: 1.  None  Intraoperative findings:   Previously implanted interstim device palpable under old scar Removal of [employer] Interstim Device and lead  Indication:  Melissa [name] is a 89 y.o. female with history of an InterStim device placed in 2011 with persistent pain over battery site.  The InterStim device never worked and was turned off shortly after it was placed.  She is now requesting removal of this device.  Description of procedure:  After risks and benefits of the procedure were discussed with the patient in detail, informed consent was obtained.  Anesthesia was inducted and antibiotics were administered.  The patient was positioned in the left lateral decubitus position.  The patient was prepped in the usual sterile fashion and a time out was performed.    1% lidocaine and .25% marcaine without epi were used to anesthetize the area. An incision was made over her prior scar over the location of the battery pack.  The capsule was then sharply incised with a knife.  Blunt dissection continued until the Interstim device could be freed from the capsule.  The lead was still attached to the device and this was gently pulled on until it was freed and removed intact with the device.  The capsule was then sharply excised.  The open cavity was irrigated copiously.  Hemostasis was achieved with Bovie electrocautery.  The incision was then closed in 2 layers with 3-0 Vicryl suture followed by 4 Monocryl in the skin.  Dermabond was applied.  The patient emerged from anesthesia and transferred the PACU in stable  condition.  Plan:  Follow up in the office

## 2022-11-17 NOTE — Transfer of Care (Signed)
Immediate Anesthesia Transfer of Care Note  Patient: Melissa [name]  Procedure(s) Performed: REMOVAL OF INTERSTIM IMPLANT  Patient Location: PACU  Anesthesia Type:MAC  Level of Consciousness: awake, alert , oriented, and patient cooperative  Airway & Oxygen Therapy: Patient Spontanous Breathing and Patient connected to face mask oxygen  Post-op Assessment: Report given to RN, Post -op Vital signs reviewed and stable, and Patient moving all extremities  Post vital signs: Reviewed and stable  Last Vitals:  Vitals Value Taken Time  BP 103/81 11/17/22 1555  Temp    Pulse 75 11/17/22 1555  Resp 15 11/17/22 1556  SpO2 100 % 11/17/22 1555  Vitals shown include unvalidated device data.  Last Pain:  Vitals:   11/17/22 1344  TempSrc:   PainSc: 0-No pain         Complications: No notable events documented.

## 2022-11-17 NOTE — Anesthesia Postprocedure Evaluation (Signed)
Anesthesia Post Note  Patient: Melissa [name]  Procedure(s) Performed: REMOVAL OF INTERSTIM IMPLANT     Patient location during evaluation: PACU Anesthesia Type: MAC Level of consciousness: awake Pain management: pain level controlled Vital Signs Assessment: post-procedure vital signs reviewed and stable Respiratory status: spontaneous breathing, nonlabored ventilation and respiratory function stable Cardiovascular status: blood pressure returned to baseline and stable Postop Assessment: no apparent nausea or vomiting Anesthetic complications: no   No notable events documented.  Last Vitals:  Vitals:   11/17/22 1630 11/17/22 1645  BP: 132/75 128/75  Pulse: 74 75  Resp: 16 18  Temp: 36.6 C 36.7 C  SpO2: 97% 94%    Last Pain:  Vitals:   11/17/22 1645  TempSrc:   PainSc: 0-No pain                 Melissa [name] P Theo Reither

## 2022-11-18 ENCOUNTER — Encounter (HOSPITAL_COMMUNITY): Payer: Self-pay | Admitting: Urology

## 2022-11-30 DIAGNOSIS — M545 Low back pain, unspecified: Secondary | ICD-10-CM | POA: Diagnosis not present

## 2022-12-18 ENCOUNTER — Other Ambulatory Visit: Payer: Self-pay | Admitting: Internal Medicine

## 2023-02-01 ENCOUNTER — Other Ambulatory Visit: Payer: Self-pay | Admitting: Internal Medicine

## 2023-02-02 DIAGNOSIS — M5431 Sciatica, right side: Secondary | ICD-10-CM | POA: Diagnosis not present

## 2023-02-02 DIAGNOSIS — M48062 Spinal stenosis, lumbar region with neurogenic claudication: Secondary | ICD-10-CM | POA: Diagnosis not present

## 2023-02-02 DIAGNOSIS — M4156 Other secondary scoliosis, lumbar region: Secondary | ICD-10-CM | POA: Diagnosis not present

## 2023-02-02 DIAGNOSIS — M5432 Sciatica, left side: Secondary | ICD-10-CM | POA: Diagnosis not present

## 2023-02-04 ENCOUNTER — Other Ambulatory Visit: Payer: Self-pay | Admitting: Orthopedic Surgery

## 2023-02-04 DIAGNOSIS — M5431 Sciatica, right side: Secondary | ICD-10-CM

## 2023-02-04 DIAGNOSIS — M4156 Other secondary scoliosis, lumbar region: Secondary | ICD-10-CM

## 2023-02-04 DIAGNOSIS — M5432 Sciatica, left side: Secondary | ICD-10-CM

## 2023-02-04 DIAGNOSIS — M48062 Spinal stenosis, lumbar region with neurogenic claudication: Secondary | ICD-10-CM

## 2023-02-08 ENCOUNTER — Other Ambulatory Visit: Payer: Self-pay | Admitting: Internal Medicine

## 2023-02-15 ENCOUNTER — Ambulatory Visit
Admission: RE | Admit: 2023-02-15 | Discharge: 2023-02-15 | Disposition: A | Discharge status: Home / Self Care | Payer: Medicare Other | Source: Ambulatory Visit | Attending: Orthopedic Surgery | Admitting: Orthopedic Surgery

## 2023-02-15 DIAGNOSIS — M5431 Sciatica, right side: Secondary | ICD-10-CM

## 2023-02-15 DIAGNOSIS — M5432 Sciatica, left side: Secondary | ICD-10-CM

## 2023-02-15 DIAGNOSIS — M4156 Other secondary scoliosis, lumbar region: Secondary | ICD-10-CM

## 2023-02-15 DIAGNOSIS — M47816 Spondylosis without myelopathy or radiculopathy, lumbar region: Secondary | ICD-10-CM | POA: Diagnosis not present

## 2023-02-15 DIAGNOSIS — M48062 Spinal stenosis, lumbar region with neurogenic claudication: Secondary | ICD-10-CM

## 2023-02-15 DIAGNOSIS — M48061 Spinal stenosis, lumbar region without neurogenic claudication: Secondary | ICD-10-CM | POA: Diagnosis not present

## 2023-02-22 NOTE — Progress Notes (Signed)
Annual Wellness Visit    Patient Care Team: Alexx Mcburney, Luanna Cole, MD as PCP - General (Internal Medicine) Christell Constant, MD as PCP - Cardiology (Cardiology) Nyima Vanacker, Luanna Cole, MD (Internal Medicine) Christell Constant, MD as Consulting Physician (Cardiology)  Visit Date: 03/08/23   Chief Complaint  Patient presents with   Medicare Wellness    Concerns with left leg pain, swelling in lower legs, insomnia    Subjective:   Patient: Melissa [name], Female    DOB: 1931-06-29, 89 y.o.   MRN: [id number]  Mariposa Way is a 89 y.o. Female who presents today for her Annual Wellness Visit.  She is accompanied by her daughter.  History of anxiety and depression treated with alprazolam 0.5 mg morning and bedtime, sertraline 100 mg daily, mirtazapine 7.5-15 mg at bedtime. She stopped sertraline and subsequently developed sleeping problems. Would like to restart this.  History of hyperlipidemia treated with atorvastatin 80 mg daily. Lipid panel normal.  History of hypertension treated with valsartan 80 mg daily. Blood pressure elevated today at 142/80.  Discussed ear wax removal.  Has onychomycosis on her left first toe.  Echocardiogram in March 2022 showed estimated ejection fraction 60 to 65%, normal right ventricular systolic function, normal mitral valve. Mild mitral regurgitation. Severe tricuspid valve regurgitation. No aortic stenosis or aortic regurgitation. Aortic valve was normal. Was recommended she be maintained on statin, Plavix and follow-up with echocardiogram in 6 months which would be November 2022. Has a follow-up with her cardiologist on 06/21/23.  She has severe multilevel lumbar stenosis and is not a candidate for surgery at her age. At one point she had a spinal stimulator placed but had it disconnected because it caused some odd sensations in her vagina. Has lumbar radiculopathy. Has tried epidural steroid injections. Referral was made to [facility] pain  management in June 2021 but patient did not seem to think that was helpful. Back hurts when walking or standing. She has started going to [facility]. 02/15/23 MRI lumbar spine showed 1) Multilevel spondylosis of the lumbar spine as described, 2) Severe central canal stenosis at L1-2 with crowding of the nerve roots, 3) Moderate to severe right and moderate left foraminal stenosis at L1-2, 4) Mild subarticular narrowing bilaterally at T12-L1; worse on the right. Moderate right foraminal stenosis is present at T12-L1, 5) Mild subarticular and moderate foraminal stenosis bilaterally at L2-3, 6) Moderate left and mild right subarticular stenosis at L3-4 and L4-5 with moderate foraminal narrowing bilaterally, 7) Moderate subarticular narrowing bilaterally at L5-S1 with mild foraminal narrowing bilaterally, 8) Simple cyst of the left kidney measures 2.4 cm. She has been having tingling in her legs. Does not tolerate gabapentin.  History of benign right breast biopsy in the remote past.  Does not see a hearing specialist or ENT.  History of bladder dysfunction with urinary frequency. InterStim implant removed on 11/17/22.  Intolerant of aspirin it causes dizziness and nausea. Codeine causes dizziness and nausea. Tramadol causes dizziness and weakness.  She did have some counseling with Mathis Fare, social worker in 2020 for anxiety and grief.  She tested positive for COVID-19 in January 2021. This test was done at [facility] and she did not have serious illness.   Glucose normal. Kidney, liver functions normal. Electrolytes normal. Blood proteins normal. WBC low at 3.5. TSH at 2.55.  Mammogram last completed 06/16/22. No mammographic evidence of malignancy. Recommended repeat in 2024.  Social history: Husband died at [facility] with a malignancy.  They were married about 60 years. He was president of [employer]. They moved to [place] not too long before he passed away as he  had retired. She has a daughter who lives in [place] and she now resides with her daughter, son-in-law and their family. She has 1 son who lives in [place].  Past Medical History:  Diagnosis Date   Allergy    Alzheimer disease (HCC)    Anxiety    Bladder dysfunction    Bulging lumbar disc    Depression    Hyperlipidemia    Hypertension    Low back pain    Lumbar radiculopathy    Spinal stenosis      Family History  Problem Relation Age of Onset   Arthritis Mother    Alcohol abuse Father    Anxiety disorder Sister    Anxiety disorder Granddaughter    Anxiety disorder Niece      Social History   Social History Narrative   01/06/22 lives with [name]     Review of Systems  Constitutional:  Negative for chills, fever, malaise/fatigue and weight loss.  HENT:  Negative for hearing loss, sinus pain and sore throat.   Respiratory:  Negative for cough, hemoptysis and shortness of breath.   Cardiovascular:  Negative for chest pain, palpitations, leg swelling and PND.  Gastrointestinal:  Negative for abdominal pain, constipation, diarrhea, heartburn, nausea and vomiting.  Genitourinary:  Negative for dysuria, frequency and urgency.  Musculoskeletal:  Positive for back pain. Negative for myalgias and neck pain.  Skin:  Negative for itching and rash.  Neurological:  Positive for tingling (Legs). Negative for dizziness, seizures and headaches.  Endo/Heme/Allergies:  Negative for polydipsia.  Psychiatric/Behavioral:  Negative for depression. The patient is not nervous/anxious.       Objective:   Vitals: BP (!) 142/80 (BP Location: Left Arm)   Pulse (!) 46   Temp (!) 97.1 F (36.2 C)   Ht 4\' 10"  (1.473 m)   Wt 140 lb 1.9 oz (63.6 kg)   SpO2 97%   BMI 29.29 kg/m   Physical Exam Vitals and nursing note reviewed.  Constitutional:      General: She is not in acute distress.    Appearance: Normal appearance. She is not ill-appearing or toxic-appearing.  HENT:     Head:  Normocephalic and atraumatic.     Right Ear: Hearing, tympanic membrane, ear canal and external ear normal.     Left Ear: Hearing, tympanic membrane, ear canal and external ear normal.     Ears:     Comments: Some cerumen present right ear.    Mouth/Throat:     Pharynx: Oropharynx is clear.     Comments: Good dentition. Eyes:     Extraocular Movements: Extraocular movements intact.     Pupils: Pupils are equal, round, and reactive to light.  Neck:     Thyroid: No thyroid mass, thyromegaly or thyroid tenderness.     Vascular: No carotid bruit.  Cardiovascular:     Rate and Rhythm: Normal rate and regular rhythm. No extrasystoles are present.    Pulses:          Dorsalis pedis pulses are 1+ on the right side and 1+ on the left side.     Heart sounds: Normal heart sounds. No murmur heard.    No friction rub. No gallop.  Pulmonary:     Effort: Pulmonary effort is normal.     Breath sounds: Normal breath sounds. No  decreased breath sounds, wheezing, rhonchi or rales.  Chest:     Chest wall: No mass.  Abdominal:     Palpations: Abdomen is soft. There is no hepatomegaly, splenomegaly or mass.     Tenderness: There is no abdominal tenderness.     Hernia: No hernia is present.  Musculoskeletal:     Cervical back: Normal range of motion.     Right lower leg: No edema.     Left lower leg: No edema.  Feet:     Comments: Onychomycosis left first toe. Lymphadenopathy:     Cervical: No cervical adenopathy.     Upper Body:     Right upper body: No supraclavicular adenopathy.     Left upper body: No supraclavicular adenopathy.  Skin:    General: Skin is warm and dry.  Neurological:     General: No focal deficit present.     Mental Status: She is alert and oriented to person, place, and time. Mental status is at baseline.     Sensory: Sensation is intact.     Motor: Motor function is intact. No weakness.     Deep Tendon Reflexes: Reflexes are normal and symmetric.  Psychiatric:         Attention and Perception: Attention normal.        Mood and Affect: Mood normal.        Speech: Speech normal.        Behavior: Behavior normal.        Thought Content: Thought content normal.        Cognition and Memory: Cognition normal.        Judgment: Judgment normal.      Most recent functional status assessment:    11/10/2022    1:09 PM  In your present state of health, do you have any difficulty performing the following activities:  Doing errands, shopping? 0   Most recent fall risk assessment:    10/02/2022   11:15 AM  Fall Risk   Falls in the past year? 0  Number falls in past yr: 0  Injury with Fall? 0  Risk for fall due to : Impaired balance/gait  Follow up Falls prevention discussed    Most recent depression screenings:    10/02/2022   11:16 AM 08/27/2022    4:02 PM  PHQ 2/9 Scores  PHQ - 2 Score 0 6  PHQ- 9 Score  16   Most recent cognitive screening:    03/05/2022   11:12 AM  6CIT Screen  What Year? 0 points  What month? 0 points  What time? 0 points  Count back from 20 0 points  Months in reverse 2 points  Repeat phrase 2 points  Total Score 4 points     Results:   Studies obtained and personally reviewed by me:  Mammogram last completed 06/16/22. No mammographic evidence of malignancy. Recommended repeat in 2024.  02/15/23 MRI lumbar spine showed 1) Multilevel spondylosis of the lumbar spine as described, 2) Severe central canal stenosis at L1-2 with crowding of the nerve roots, 3) Moderate to severe right and moderate left foraminal stenosis at L1-2, 4) Mild subarticular narrowing bilaterally at T12-L1; worse on the right. Moderate right foraminal stenosis is present at T12-L1, 5) Mild subarticular and moderate foraminal stenosis bilaterally at L2-3, 6) Moderate left and mild right subarticular stenosis at L3-4 and L4-5 with moderate foraminal narrowing bilaterally, 7) Moderate subarticular narrowing bilaterally at L5-S1 with mild foraminal  narrowing bilaterally, 8) Simple cyst  of the left kidney measures 2.4 cm.   Labs:       Component Value Date/Time   NA 138 03/04/2023 0900   NA 141 07/01/2022 1029   K 4.2 03/04/2023 0900   CL 106 03/04/2023 0900   CO2 25 03/04/2023 0900   GLUCOSE 90 03/04/2023 0900   BUN 13 03/04/2023 0900   BUN 19 07/01/2022 1029   CREATININE 0.87 03/04/2023 0900   CALCIUM 9.5 03/04/2023 0900   PROT 7.0 03/04/2023 0900   PROT 6.5 03/16/2022 0935   ALBUMIN 4.0 03/16/2022 0935   AST 19 03/04/2023 0900   ALT 15 03/04/2023 0900   ALKPHOS 88 03/16/2022 0935   BILITOT 0.9 03/04/2023 0900   BILITOT 0.5 03/16/2022 0935   GFRNONAA 57 (L) 11/10/2022 1300   GFRNONAA 65 02/27/2020 0918   GFRAA 75 02/27/2020 0918     Lab Results  Component Value Date   WBC 3.5 (L) 03/04/2023   HGB 12.0 03/04/2023   HCT 36.4 03/04/2023   MCV 83.1 03/04/2023   PLT 199 03/04/2023    Lab Results  Component Value Date   CHOL 149 03/04/2023   HDL 64 03/04/2023   LDLCALC 70 03/04/2023   TRIG 73 03/04/2023   CHOLHDL 2.3 03/04/2023    No results found for: "HGBA1C"   Lab Results  Component Value Date   TSH 2.55 03/04/2023    Assessment & Plan:   Anxiety and depression: treated with alprazolam 0.5 mg morning and bedtime, sertraline 100 mg daily, mirtazapine 7.5-15 mg at bedtime. She stopped sertraline and subsequently developed sleeping problems. Refilled sertraline.  Hyperlipidemia: treated with atorvastatin 80 mg daily. Lipid panel normal.  Hypertension: treated with valsartan 80 mg daily. Blood pressure elevated today at 142/80.  Onychomycosis: recommended OTC medication.  Severe multilevel lumbar stenosis: she has started going to [facility]. 02/15/23 MRI lumbar spine showed 1) Multilevel spondylosis of the lumbar spine, 2) Severe central canal stenosis at L1-2 with crowding of the nerve roots, 3) Moderate to severe right and moderate left foraminal stenosis at L1-2, 4) Mild subarticular narrowing  bilaterally at T12-L1; worse on the right. Moderate right foraminal stenosis is present at T12-L1, 5) Mild subarticular and moderate foraminal stenosis bilaterally at L2-3, 6) Moderate left and mild right subarticular stenosis at L3-4 and L4-5 with moderate foraminal narrowing bilaterally, 7) Moderate subarticular narrowing bilaterally at L5-S1 with mild foraminal narrowing bilaterally, 8) Simple cyst of the left kidney measures 2.4 cm. She has been having tingling in her legs. Does not tolerate gabapentin.  Urinalysis showed small leukocytes.  Mammogram last completed 06/16/22. No mammographic evidence of malignancy. Recommended repeat in 2024.  Vaccine counseling: she will go to the pharmacy for tetanus update. UTD on flu vaccine.  Return in 6 months for health maintenance visit or as needed.            Annual wellness visit done today including the all of the following: Reviewed patient's Family Medical History Reviewed and updated list of patient's medical providers Assessment of cognitive impairment was done Assessed patient's functional ability Established a written schedule for health screening services Health Risk Assessent Completed and Reviewed  Discussed health benefits of physical activity, and encouraged her to engage in regular exercise appropriate for her age and condition.        I,Alexander Ruley,acting as a Neurosurgeon for Margaree Mackintosh, MD.,have documented all relevant documentation on the behalf of Margaree Mackintosh, MD,as directed by  Margaree Mackintosh, MD while in  the presence of Margaree Mackintosh, MD.   I, Margaree Mackintosh, MD, have reviewed all documentation for this visit. The documentation on 03/08/23 for the exam, diagnosis, procedures, and orders are all accurate and complete.

## 2023-03-02 DIAGNOSIS — M5432 Sciatica, left side: Secondary | ICD-10-CM | POA: Diagnosis not present

## 2023-03-02 DIAGNOSIS — M48062 Spinal stenosis, lumbar region with neurogenic claudication: Secondary | ICD-10-CM | POA: Diagnosis not present

## 2023-03-02 DIAGNOSIS — M810 Age-related osteoporosis without current pathological fracture: Secondary | ICD-10-CM | POA: Diagnosis not present

## 2023-03-02 DIAGNOSIS — M4156 Other secondary scoliosis, lumbar region: Secondary | ICD-10-CM | POA: Diagnosis not present

## 2023-03-04 ENCOUNTER — Other Ambulatory Visit: Payer: Medicare Other

## 2023-03-04 DIAGNOSIS — I10 Essential (primary) hypertension: Secondary | ICD-10-CM

## 2023-03-04 DIAGNOSIS — Z1329 Encounter for screening for other suspected endocrine disorder: Secondary | ICD-10-CM

## 2023-03-04 DIAGNOSIS — E782 Mixed hyperlipidemia: Secondary | ICD-10-CM | POA: Diagnosis not present

## 2023-03-04 LAB — CBC WITH DIFFERENTIAL/PLATELET
Absolute Monocytes: 371 cells/uL (ref 200–950)
Basophils Absolute: 49 cells/uL (ref 0–200)
Eosinophils Absolute: 280 cells/uL (ref 15–500)
Eosinophils Relative: 8 %
HCT: 36.4 % (ref 35.0–45.0)
Hemoglobin: 12 g/dL (ref 11.7–15.5)
Lymphs Abs: 1257 cells/uL (ref 850–3900)
MCH: 27.4 pg (ref 27.0–33.0)
MCHC: 33 g/dL (ref 32.0–36.0)
MCV: 83.1 fL (ref 80.0–100.0)
MPV: 11.6 fL (ref 7.5–12.5)
Monocytes Relative: 10.6 %
Neutro Abs: 1544 cells/uL (ref 1500–7800)
Neutrophils Relative %: 44.1 %
Platelets: 199 10*3/uL (ref 140–400)
RBC: 4.38 10*6/uL (ref 3.80–5.10)
RDW: 14.3 % (ref 11.0–15.0)
Total Lymphocyte: 35.9 %
WBC: 3.5 10*3/uL — ABNORMAL LOW (ref 3.8–10.8)

## 2023-03-05 LAB — CBC WITH DIFFERENTIAL/PLATELET: Basophils Relative: 1.4 %

## 2023-03-08 ENCOUNTER — Ambulatory Visit: Payer: Medicare Other | Admitting: Internal Medicine

## 2023-03-08 ENCOUNTER — Ambulatory Visit (INDEPENDENT_AMBULATORY_CARE_PROVIDER_SITE_OTHER): Payer: Medicare Other | Admitting: Internal Medicine

## 2023-03-08 ENCOUNTER — Encounter: Payer: Self-pay | Admitting: Internal Medicine

## 2023-03-08 VITALS — BP 142/80 | HR 46 | Temp 97.1°F | Ht <= 58 in | Wt 140.1 lb

## 2023-03-08 DIAGNOSIS — M549 Dorsalgia, unspecified: Secondary | ICD-10-CM | POA: Diagnosis not present

## 2023-03-08 DIAGNOSIS — M4807 Spinal stenosis, lumbosacral region: Secondary | ICD-10-CM

## 2023-03-08 DIAGNOSIS — I10 Essential (primary) hypertension: Secondary | ICD-10-CM

## 2023-03-08 DIAGNOSIS — G8929 Other chronic pain: Secondary | ICD-10-CM

## 2023-03-08 DIAGNOSIS — I071 Rheumatic tricuspid insufficiency: Secondary | ICD-10-CM

## 2023-03-08 DIAGNOSIS — M810 Age-related osteoporosis without current pathological fracture: Secondary | ICD-10-CM | POA: Diagnosis not present

## 2023-03-08 DIAGNOSIS — E782 Mixed hyperlipidemia: Secondary | ICD-10-CM

## 2023-03-08 DIAGNOSIS — R413 Other amnesia: Secondary | ICD-10-CM | POA: Diagnosis not present

## 2023-03-08 DIAGNOSIS — F419 Anxiety disorder, unspecified: Secondary | ICD-10-CM | POA: Diagnosis not present

## 2023-03-08 DIAGNOSIS — Z8659 Personal history of other mental and behavioral disorders: Secondary | ICD-10-CM | POA: Diagnosis not present

## 2023-03-08 DIAGNOSIS — Z0000 Encounter for general adult medical examination without abnormal findings: Secondary | ICD-10-CM

## 2023-03-08 DIAGNOSIS — M48062 Spinal stenosis, lumbar region with neurogenic claudication: Secondary | ICD-10-CM

## 2023-03-08 DIAGNOSIS — F32A Depression, unspecified: Secondary | ICD-10-CM | POA: Diagnosis not present

## 2023-03-08 LAB — POC URINALSYSI DIPSTICK (AUTOMATED)
Glucose, UA: NEGATIVE
Ketones, UA: NEGATIVE
Nitrite, UA: NEGATIVE
Protein, UA: NEGATIVE
Spec Grav, UA: >=1.03 — AB (ref 1.010–1.025)
Urobilinogen, UA: 0.2 E.U./dL
pH, UA: 5 (ref 5.0–8.0)

## 2023-03-08 MED ORDER — SERTRALINE HCL 100 MG PO TABS: 100.0000 mg | ORAL_TABLET | Freq: Every day | ORAL | 3 refills | Status: DC

## 2023-03-08 NOTE — Progress Notes (Deleted)
   Subjective:    Patient ID: Melissa [name], female    DOB: 1931-05-24, 89 y.o.   MRN: [id number]  HPI Presents for Medicare annual wellness visit  .M           Review of Systems see CPE note     Objective:   Physical Exam   See CPE note      Assessment & Plan:

## 2023-03-08 NOTE — Progress Notes (Signed)
Annual Wellness Visit     Patient: Melissa [name], Female    DOB: 1931/02/03, 89 y.o.   MRN: [id number] Visit Date: 03/08/2023  Chief Complaint  Patient presents with   Medicare wellness visit   Subjective    Melissa [name] is a 89 y.o. female who presents today for her Annual Wellness Visit.  Subjective:   Patient presents for Medicare Annual/Subsequent preventive examination.  Review Past Medical/Family/Social: Resides with daughter and son-in-law. Has own bedroom upstairs that is fairly large  Has moderate late onset Alzheimer's dementia with anxiety Risk Factors  Current exercise habits: walks some Dietary issues discussed: yes  Cardiac risk factors: no family hx CAD, has hyperlipidemia  Depression Screen  (Note: if answer to either of the following is "Yes", a more complete depression screening is indicated)   Over the past two weeks, have you felt down, depressed or hopeless? No  Over the past two weeks, have you felt little interest or pleasure in doing things? No Have you lost interest or pleasure in daily life? No Do you often feel hopeless? No Do you cry easily over simple problems? No   Activities of Daily Living  In your present state of health, do you have any difficulty performing the following activities?:   Driving? No longer drives Managing money? No  Feeding yourself? No  Getting from bed to chair? No  Climbing a flight of stairs? Yes at times Preparing food and eating?: daughter cooks Bathing or showering? No  Getting dressed: No  Getting to the toilet? No  Using the toilet:No  Moving around from place to place: No except for back pain- severe spinal stenosis In the past year have you fallen or had a near fall?:No  Are you sexually active? No  Do you have more than one partner? No   Hearing Difficulties: No  Do you often ask people to speak up or repeat themselves? No  Do you experience ringing or noises in your ears? No  Do you  have difficulty understanding soft or whispered voices? No  Do you feel that you have a problem with memory? No Do you often misplace items? No    Home Safety:  Do you have a smoke alarm at your residence? Yes     Cognitive Testing  Alert? Yes Normal Appearance?Yes  Oriented to person? Yes Place? Yes  Time? Yes  Recall of three objects? Not tested- has had formal testing by Dr. Kieth Brightly with dx moderate late onset Alzheimer's disease March 2023 Can perform simple calculations? Yes  Displays appropriate judgment?Yes  Can read the correct time from a watch face?Yes   List the Names of Other Physician/Practitioners you currently use:  See referral list for the physicians patient is currently seeing.  Cardiologist- Dr.  Neurologist- Dr. Marjory Lies  Dr. Precious Gilding for lumbar spinal stenosis   Review of Systems:no new complaints persistent back pain but does not seem to be in a lot of pain today. Ambulates with a cane   Objective:     General appearance: Appears younger than stated age Head: Normocephalic, without obvious abnormality, atraumatic  Eyes: conj clear, EOMi PEERLA  Ears: normal TM's and external ear canals both ears  Nose: Nares normal. Septum midline. Mucosa normal. No drainage or sinus tenderness.  Throat: lips, mucosa, and tongue normal; teeth and gums normal  Neck: no adenopathy, no carotid bruit, no JVD, supple, symmetrical, trachea midline and thyroid not enlarged, symmetric, no tenderness/mass/nodules  No CVA tenderness.  Lungs:  clear to auscultation bilaterally   Heart: regular rate and rhythm, S1, S2 normal, no murmur, click, rub or gallop  Abdomen: soft, non-tender; bowel sounds normal; no masses, no organomegaly  Musculoskeletal: ROM normal in all joints, no crepitus, no deformity, Normal muscle strengthen. Back  is symmetric, no curvature. Skin: Skin color, texture, turgor normal. No rashes or lesions  Lymph nodes: Cervical, supraclavicular, and  axillary nodes normal.  Neurologic: CN 2 -12 Normal, Normal symmetric reflexes. Normal coordination and gait  Psych: Alert, Mood appear stable.    Assessment:    Annual wellness medicare exam   Plan:    During the course of the visit the patient was educated and counseled about appropriate screening and preventive services including:        Patient Instructions (the written plan) was given to the patient.  Medicare Attestation  I have personally reviewed:  The patient's medical and social history  Their use of alcohol, tobacco or illicit drugs  Their current medications and supplements  The patient's functional ability including ADLs,fall risks, home safety risks, cognitive, and hearing and visual impairment  Diet and physical activities  Evidence for depression or mood disorders  The patient's weight, height, BMI, and visual acuity have been recorded in the chart. I have made referrals, counseling, and provided education to the patient based on review of the above and I have provided the patient with a written personalized care plan for preventive services.          Patient Care Team: Margaree Mackintosh, MD as PCP - General (Internal Medicine) Christell Constant, MD as PCP - Cardiology (Cardiology) Aloni Chuang, Luanna Cole, MD (Internal Medicine) Christell Constant, MD as Consulting Physician (Cardiology)  Review of Systems   Objective         Most recent functional status assessment:    11/10/2022    1:09 PM  In your present state of health, do you have any difficulty performing the following activities:  Doing errands, shopping? 0   Most recent fall risk assessment:    10/02/2022   11:15 AM  Fall Risk   Falls in the past year? 0  Number falls in past yr: 0  Injury with Fall? 0  Risk for fall due to : Impaired balance/gait  Follow up Falls prevention discussed    Most recent depression screenings:    03/08/2023   12:19 PM 10/02/2022   11:16 AM  PHQ  2/9 Scores  PHQ - 2 Score 0 0   Most recent cognitive screening:    03/05/2022   11:12 AM  6CIT Screen  What Year? 0 points  What month? 0 points  What time? 0 points  Count back from 20 0 points  Months in reverse 2 points  Repeat phrase 2 points  Total Score 4 points       Assessment & Plan  Cognitive issues stable. Does not seem depressed. Seeing Dr. Precious Gilding for back pain now      Annual wellness visit done today including the all of the following: Reviewed patient's Family Medical History Reviewed and updated list of patient's medical providers Assessment of cognitive impairment was done Assessed patient's functional ability Established a written schedule for health screening services Health Risk Assessent Completed and Reviewed  Discussed health benefits of physical activity, and encouraged her to engage in regular exercise appropriate for her age and condition.         IMargaree Mackintosh, MD, have reviewed all documentation for  this visit. The documentation on 03/08/23 for the exam, diagnosis, procedures, and orders are all accurate and complete.   Margaree Mackintosh, MD

## 2023-03-09 DIAGNOSIS — M48061 Spinal stenosis, lumbar region without neurogenic claudication: Secondary | ICD-10-CM | POA: Insufficient documentation

## 2023-03-09 NOTE — Patient Instructions (Addendum)
It was a pleasure to see you today. Labs are stable. May try Penlac over the counter for toenail fungus.Tetanus update may be received at pharmacy. RTC in 6 months. No change in medications. Restart Sertraline. Follow up with Dr. Precious Gilding for spine issues.

## 2023-03-10 ENCOUNTER — Other Ambulatory Visit: Payer: Self-pay | Admitting: Internal Medicine

## 2023-03-10 NOTE — Addendum Note (Signed)
Addended by: Theresia Lo on: 03/10/2023 10:26 PM   Modules accepted: Level of Service

## 2023-03-22 ENCOUNTER — Other Ambulatory Visit: Payer: Self-pay | Admitting: Internal Medicine

## 2023-03-22 NOTE — Telephone Encounter (Signed)
Last refill 01/06/2022 #90 + 1 refill

## 2023-03-30 ENCOUNTER — Ambulatory Visit (INDEPENDENT_AMBULATORY_CARE_PROVIDER_SITE_OTHER): Payer: Medicare Other

## 2023-03-30 VITALS — BP 110/70 | HR 80 | Temp 98.0°F | Ht <= 58 in | Wt 140.0 lb

## 2023-03-30 DIAGNOSIS — M810 Age-related osteoporosis without current pathological fracture: Secondary | ICD-10-CM

## 2023-03-30 MED ORDER — DENOSUMAB 60 MG/ML ~~LOC~~ SOSY
60.0000 mg | PREFILLED_SYRINGE | Freq: Once | SUBCUTANEOUS | Status: AC
Start: 2023-03-30 — End: 2023-03-30
  Administered 2023-03-30: 60 mg via SUBCUTANEOUS

## 2023-03-30 NOTE — Progress Notes (Signed)
Per order of Dr. Renold Genta patient received Prolia 60 mg/ml in left arm. Patient tolerated well. Vital signs are in within normal limits.

## 2023-03-30 NOTE — Patient Instructions (Signed)
Return in 6 months for next prolia injection.

## 2023-04-02 DIAGNOSIS — M48062 Spinal stenosis, lumbar region with neurogenic claudication: Secondary | ICD-10-CM | POA: Diagnosis not present

## 2023-04-02 DIAGNOSIS — M4156 Other secondary scoliosis, lumbar region: Secondary | ICD-10-CM | POA: Diagnosis not present

## 2023-04-02 DIAGNOSIS — M47816 Spondylosis without myelopathy or radiculopathy, lumbar region: Secondary | ICD-10-CM | POA: Diagnosis not present

## 2023-04-19 DIAGNOSIS — M47816 Spondylosis without myelopathy or radiculopathy, lumbar region: Secondary | ICD-10-CM | POA: Diagnosis not present

## 2023-05-14 DIAGNOSIS — Z23 Encounter for immunization: Secondary | ICD-10-CM | POA: Diagnosis not present

## 2023-05-15 DIAGNOSIS — M48062 Spinal stenosis, lumbar region with neurogenic claudication: Secondary | ICD-10-CM | POA: Diagnosis not present

## 2023-05-18 DIAGNOSIS — M48062 Spinal stenosis, lumbar region with neurogenic claudication: Secondary | ICD-10-CM | POA: Diagnosis not present

## 2023-05-21 DIAGNOSIS — M48062 Spinal stenosis, lumbar region with neurogenic claudication: Secondary | ICD-10-CM | POA: Diagnosis not present

## 2023-05-25 DIAGNOSIS — M48062 Spinal stenosis, lumbar region with neurogenic claudication: Secondary | ICD-10-CM | POA: Diagnosis not present

## 2023-05-27 DIAGNOSIS — M48062 Spinal stenosis, lumbar region with neurogenic claudication: Secondary | ICD-10-CM | POA: Diagnosis not present

## 2023-05-28 DIAGNOSIS — Z23 Encounter for immunization: Secondary | ICD-10-CM | POA: Diagnosis not present

## 2023-06-01 DIAGNOSIS — M48062 Spinal stenosis, lumbar region with neurogenic claudication: Secondary | ICD-10-CM | POA: Diagnosis not present

## 2023-06-07 DIAGNOSIS — M48062 Spinal stenosis, lumbar region with neurogenic claudication: Secondary | ICD-10-CM | POA: Diagnosis not present

## 2023-06-10 DIAGNOSIS — M47816 Spondylosis without myelopathy or radiculopathy, lumbar region: Secondary | ICD-10-CM | POA: Diagnosis not present

## 2023-06-10 DIAGNOSIS — M48062 Spinal stenosis, lumbar region with neurogenic claudication: Secondary | ICD-10-CM | POA: Diagnosis not present

## 2023-06-14 ENCOUNTER — Ambulatory Visit
Admission: RE | Admit: 2023-06-14 | Discharge: 2023-06-14 | Disposition: A | Discharge status: Home / Self Care | Payer: Medicare Other | Source: Ambulatory Visit | Attending: Internal Medicine | Admitting: Internal Medicine

## 2023-06-14 DIAGNOSIS — Z1231 Encounter for screening mammogram for malignant neoplasm of breast: Secondary | ICD-10-CM | POA: Diagnosis not present

## 2023-06-21 ENCOUNTER — Ambulatory Visit (HOSPITAL_COMMUNITY): Payer: Medicare Other | Attending: Internal Medicine

## 2023-06-21 DIAGNOSIS — I071 Rheumatic tricuspid insufficiency: Secondary | ICD-10-CM | POA: Diagnosis not present

## 2023-06-21 LAB — ECHOCARDIOGRAM COMPLETE
Area-P 1/2: 4.68 cm2
S' Lateral: 2 cm

## 2023-06-23 ENCOUNTER — Encounter: Payer: Self-pay | Admitting: Psychiatry

## 2023-06-25 DIAGNOSIS — M48062 Spinal stenosis, lumbar region with neurogenic claudication: Secondary | ICD-10-CM | POA: Diagnosis not present

## 2023-06-29 ENCOUNTER — Encounter: Payer: Self-pay | Admitting: Internal Medicine

## 2023-06-29 ENCOUNTER — Ambulatory Visit: Payer: Medicare Other | Attending: Internal Medicine | Admitting: Internal Medicine

## 2023-06-29 VITALS — BP 100/60 | HR 77 | Ht 59.0 in | Wt 142.0 lb

## 2023-06-29 DIAGNOSIS — I739 Peripheral vascular disease, unspecified: Secondary | ICD-10-CM | POA: Diagnosis not present

## 2023-06-29 DIAGNOSIS — E785 Hyperlipidemia, unspecified: Secondary | ICD-10-CM | POA: Diagnosis not present

## 2023-06-29 DIAGNOSIS — I071 Rheumatic tricuspid insufficiency: Secondary | ICD-10-CM | POA: Diagnosis not present

## 2023-06-29 MED ORDER — FUROSEMIDE 40 MG PO TABS: 40.0000 mg | ORAL_TABLET | Freq: Every day | ORAL | 11 refills | Status: AC | PRN

## 2023-06-29 NOTE — Progress Notes (Signed)
Cardiology Office Note:    Date:  06/29/2023   ID:  Melissa Melissa [name] Melissa [name], DOB 1931/06/16, MRN [id number]  PCP:  Melissa Melissa [name] Mackintosh, MD   [facility]  Cardiologist:  Melissa Melissa [name] Constant, MD  Advanced Practice Provider:  No care team member to display Electrophysiologist:  None   CC: LE follow up  History of Present Illness:    Melissa Melissa [name] Melissa [name] is a 89 y.o. female with a hx of HTN, HLD, Spinal stenosis who presents for evaluation 09/23/20.  2022: Melissa [name] had mildly abnormal TBI's, Severe TR.  On minimal diuretics her volume status improved but repeat echo showed persistent severe TR.   2023: Called in; she stopped her lasix inadverently and had leg swelling.  Started diuretic and this improved.  Heart creatinine was worse but we did not stop her diuretics due to her leg swelling and problems stopping in Melissa Melissa [name] past.  Has worsening myalgias.  Melissa Melissa [name] Melissa [name], a 89 year old with a history of significant tricuspid regurgitation, presents with complaints of fatigue and fluid build-up. She reports feeling tired and experiencing leg swelling, which she initially attributed to her known heart condition. However, she also mentions a serious back problem, spinal stenosis, which she believes could be contributing to her symptoms.  Despite these issues, Melissa Melissa [name] Melissa [name] maintains an active lifestyle, including shopping and physical therapy. She reports no chest pain and describes her breathing as "okay." She also mentions a recent incident of pulling a knee during physical therapy, which caused temporary discomfort.  In addition to her cardiac and back issues, Melissa Melissa [name] Melissa [name] reports a swollen big toe, which is planned to be evaluated by a podiatrist. She also mentions a recent echocardiogram, which she undergoes annually due to her heart condition. Melissa Melissa [name] Melissa [name] expresses uncertainty about Melissa Melissa [name] severity of her leaky heart valve, as indicated by Melissa Melissa [name] echocardiogram results.  Melissa Melissa [name] Melissa [name]'s symptoms seem  to fluctuate, with some days being better than others. For instance, she recalls an instance where pulling weeds resulted in a significant reduction in leg pain Melissa Melissa [name] following day. However, she also notes that even minor activities like pulling a few weeds can lead to increased leg pain and swelling. Despite these challenges, Melissa Melissa [name] Melissa [name] remains engaged in her care and open to treatment adjustments as needed.  Past Medical History:  Diagnosis Date   Allergy    Alzheimer disease (HCC)    Anxiety    Bladder dysfunction    Bulging lumbar disc    Depression    Hyperlipidemia    Hypertension    Low back pain    Lumbar radiculopathy    Spinal stenosis     Past Surgical History:  Procedure Laterality Date   bladder dysfunction     bladder stimulator     BREAST CYST EXCISION     CATARACT EXTRACTION     INTERSTIM IMPLANT REMOVAL N/A 11/17/2022   Procedure: REMOVAL OF INTERSTIM IMPLANT;  Surgeon: Melissa Christmas, MD;  Location: [facility];  Service: Urology;  Laterality: N/A;  60 MINUTES   ROTATOR CUFF REPAIR Right    WISDOM TOOTH EXTRACTION      Current Medications: Current Meds  Medication Sig   ALPRAZolam (XANAX) 0.5 MG tablet TAKE 1 TABLET BY MOUTH IN Melissa Melissa [name] MORNING AND AT BEDTIME   atorvastatin (LIPITOR) 80 MG tablet TAKE 1 TABLET(80 MG) BY MOUTH DAILY   cholecalciferol (VITAMIN D) 1000 units tablet Take 1,000 Units by mouth daily.   clopidogrel (PLAVIX) 75 MG tablet Take 1 tablet (75 mg total)  by mouth daily.   furosemide (LASIX) 40 MG tablet Take 1 tablet (40 mg total) by mouth daily as needed.   meloxicam (MOBIC) 15 MG tablet Take 15 mg by mouth daily.   Menthol, Topical Analgesic, (BIOFREEZE EX) Apply 1 Application topically daily as needed (pain).   mirtazapine (REMERON) 15 MG tablet ONE HALF TO ONE TABLET AT BEDTIME   Multiple Vitamins-Minerals (CENTRUM SILVER 50+WOMEN) TABS Take 1 tablet by mouth daily.   potassium chloride SA (KLOR-CON M) 20 MEQ tablet TAKE 2 TABLETS(40 MEQ) BY  MOUTH DAILY   sertraline (ZOLOFT) 100 MG tablet Take 1 tablet (100 mg total) by mouth daily.   TURMERIC PO Take 1 capsule by mouth daily.   valsartan (DIOVAN) 80 MG tablet TAKE 1 TABLET(80 MG) BY MOUTH DAILY   [DISCONTINUED] furosemide (LASIX) 80 MG tablet TAKE 1 TABLET(80 MG) BY MOUTH DAILY     Allergies:   Aspirin, Codeine, and Tramadol   Social History   Socioeconomic History   Marital status: Widowed    Spouse name: Not on file   Number of children: 2   Years of education: Not on file   Highest education level: Bachelor's degree (e.g., BA, AB, BS)  Occupational History   Not on file  Tobacco Use   Smoking status: Never   Smokeless tobacco: Never  Vaping Use   Vaping status: Never Used  Substance and Sexual Activity   Alcohol use: Not Currently    Comment: Reports 1/2 glass of wine periodically   Drug use: Never   Sexual activity: Not on file  Other Topics Concern   Not on file  Social History Narrative   01/06/22 lives with dgtr Melissa Melissa [name] Melissa [name]   Social Determinants of Health   Financial Resource Strain: Not on file  Food Insecurity: Not on file  Transportation Needs: Not on file  Physical Activity: Not on file  Stress: Not on file  Social Connections: Not on file     Family History: Melissa Melissa [name] Melissa [name]'s family history includes Alcohol abuse in her father; Anxiety disorder in her granddaughter, niece, and sister; Arthritis in her mother.  History of coronary artery disease notable for no members. History of heart failure notable for no members. History of arrhythmia notable for no members.  ROS:   Please see Melissa Melissa [name] history of present illness.     All other systems reviewed and are negative.  EKGs/Labs/Other Studies Reviewed:    Melissa Melissa [name] following studies were reviewed today:  EKG:  07/01/22: SR inferior TWI low voltage rate 80 12/22/21: SR inferior 79 TWI 09/23/20: SR rate 97, Nonspecific TWI 09/09/20: SR rate 78 with non-specific TWI  Cardiac Studies & Procedures        ECHOCARDIOGRAM  ECHOCARDIOGRAM COMPLETE 06/21/2023  Narrative ECHOCARDIOGRAM REPORT    Melissa [name] Name:   Melissa Melissa [name] Melissa [name] Date of Exam: 06/21/2023 Medical Rec #:  [id number]        Height:       58.0 in Accession #:    [id number]       Weight:       140.0 lb Date of Birth:  1931-08-04       BSA:          1.565 m Melissa [name] Age:    89 years         BP:           142/80 mmHg Melissa [name] Gender: F                HR:  79 bpm. Exam Location:  [address]  Procedure: 2D Echo, Cardiac Doppler and Color Doppler  Indications:    I07.1 TR  History:        Melissa [name] has prior history of Echocardiogram examinations, most recent 06/17/2022. TR; Risk Factors:Hypertension and Dyslipidemia.  Sonographer:    Samule Ohm RDCS Referring Phys: [id number] [facility] A Gowri Suchan  IMPRESSIONS   1. Left ventricular ejection fraction, by estimation, is 55 to 60%. Melissa Melissa [name] left ventricle has normal function. Melissa Melissa [name] left ventricle has no regional wall motion abnormalities. There is mild concentric left ventricular hypertrophy. Left ventricular diastolic parameters are consistent with Grade I diastolic dysfunction (impaired relaxation). 2. Right ventricular systolic function is normal. Melissa Melissa [name] right ventricular size is normal. There is moderately elevated pulmonary artery systolic pressure. 3. Right atrial size was mildly dilated. 4. Melissa Melissa [name] mitral valve is normal in structure. Trivial mitral valve regurgitation. No evidence of mitral stenosis. 5. Melissa Melissa [name] aortic valve is tricuspid. Aortic valve regurgitation is trivial. No aortic stenosis is present. 6. Melissa Melissa [name] inferior vena cava is normal in size with greater than 50% respiratory variability, suggesting right atrial pressure of 3 mmHg.  FINDINGS Left Ventricle: Left ventricular ejection fraction, by estimation, is 55 to 60%. Melissa Melissa [name] left ventricle has normal function. Melissa Melissa [name] left ventricle has no regional wall motion abnormalities. Melissa Melissa [name] left ventricular internal cavity size was  normal in size. There is mild concentric left ventricular hypertrophy. Left ventricular diastolic parameters are consistent with Grade I diastolic dysfunction (impaired relaxation). Indeterminate filling pressures.  Right Ventricle: Melissa Melissa [name] right ventricular size is normal. No increase in right ventricular wall thickness. Right ventricular systolic function is normal. There is moderately elevated pulmonary artery systolic pressure. Melissa Melissa [name] tricuspid regurgitant velocity is 3.56 m/s, and with an assumed right atrial pressure of 3 mmHg, Melissa Melissa [name] estimated right ventricular systolic pressure is 53.7 mmHg.  Left Atrium: Left atrial size was normal in size.  Right Atrium: Right atrial size was mildly dilated.  Pericardium: There is no evidence of pericardial effusion.  Mitral Valve: Melissa Melissa [name] mitral valve is normal in structure. Trivial mitral valve regurgitation. No evidence of mitral valve stenosis.  Tricuspid Valve: Melissa Melissa [name] tricuspid valve is normal in structure. Tricuspid valve regurgitation is mild . No evidence of tricuspid stenosis.  Aortic Valve: Melissa Melissa [name] aortic valve is tricuspid. Aortic valve regurgitation is trivial. No aortic stenosis is present.  Pulmonic Valve: Melissa Melissa [name] pulmonic valve was normal in structure. Pulmonic valve regurgitation is trivial. No evidence of pulmonic stenosis.  Aorta: Melissa Melissa [name] aortic root is normal in size and structure.  Venous: Melissa Melissa [name] inferior vena cava is normal in size with greater than 50% respiratory variability, suggesting right atrial pressure of 3 mmHg.  IAS/Shunts: No atrial level shunt detected by color flow Doppler.   LEFT VENTRICLE PLAX 2D LVIDd:         2.80 cm   Diastology LVIDs:         2.00 cm   LV e' medial:    7.18 cm/s LV PW:         1.30 cm   LV E/e' medial:  11.4 LV IVS:        1.20 cm   LV e' lateral:   11.70 cm/s LVOT diam:     1.70 cm   LV E/e' lateral: 7.0 LV SV:         49 LV SV Index:   31 LVOT Area:     2.27 cm   RIGHT VENTRICLE  IVC RV S prime:      20.30 cm/s  IVC diam: 1.00 cm TAPSE (M-mode): 1.8 cm RVSP:           53.7 mmHg  LEFT ATRIUM           Index        RIGHT ATRIUM           Index LA diam:      3.50 cm 2.24 cm/m   RA Pressure: 3.00 mmHg LA Vol (A2C): 39.4 ml 25.18 ml/m  RA Area:     18.20 cm LA Vol (A4C): 61.7 ml 39.43 ml/m  RA Volume:   48.90 ml  31.25 ml/m AORTIC VALVE LVOT Vmax:   95.00 cm/s LVOT Vmean:  66.300 cm/s LVOT VTI:    0.214 m  AORTA Ao Root diam: 2.70 cm Ao Asc diam:  2.80 cm  MITRAL VALVE                TRICUSPID VALVE MV Area (PHT): 4.68 cm     TR Peak grad:   50.7 mmHg MV Decel Time: 162 msec     TR Vmax:        356.00 cm/s MV E velocity: 81.80 cm/s   Estimated RAP:  3.00 mmHg MV A velocity: 103.00 cm/s  RVSP:           53.7 mmHg MV E/A ratio:  0.79 SHUNTS Systemic VTI:  0.21 m Systemic Diam: 1.70 cm  Chilton Si MD Electronically signed by Chilton Si MD Signature Date/Time: 06/21/2023/12:44:17 PM    Final               Recent Labs: 07/01/2022: NT-Pro BNP 46 03/04/2023: ALT 15; BUN 13; Creat 0.87; Hemoglobin 12.0; Platelets 199; Potassium 4.2; Sodium 138; TSH 2.55  Recent Lipid Panel    Component Value Date/Time   CHOL 149 03/04/2023 0900   CHOL 168 07/01/2022 1029   TRIG 73 03/04/2023 0900   HDL 64 03/04/2023 0900   HDL 67 07/01/2022 1029   CHOLHDL 2.3 03/04/2023 0900   LDLCALC 70 03/04/2023 0900    Physical Exam:    VS:  BP 100/60   Pulse 77   Ht 4\' 11"  (1.499 m)   Wt 142 lb (64.4 kg)   SpO2 94%   BMI 28.68 kg/m     Wt Readings from Last 3 Encounters:  06/29/23 142 lb (64.4 kg)  03/30/23 140 lb (63.5 kg)  03/08/23 140 lb 1.9 oz (63.6 kg)   Gen: no distress Neck: JVD has improved Cardiac: No Rubs or Gallops, holosystolc 3/6 murmur, RRR , +2 radial pulses Respiratory: Clear to auscultation bilaterally, normal effort, normal  respiratory rate GI: Soft, nontender, non-distended  MS: No LE edema;  moves all extremities Integument: Skin feels  warm Neuro:  At time of evaluation, alert and oriented to person/place/time/situation  Psych: Normal affect, Melissa [name] feels pl   ASSESSMENT:    1. Severe tricuspid regurgitation   2. Hyperlipidemia, unspecified hyperlipidemia type   3. PAD (peripheral artery disease) (HCC)     PLAN:     Tricuspid Regurgitation - Chronic tricuspid regurgitation  moderate severity. Reports minimal symptoms, including mild leg swelling and fatigue. Echocardiogram shows persistent regurgitation. Discussed potential future interventions if symptoms worsen, but currently, no procedural intervention is necessary. Explained that Melissa Melissa [name] procedure to clip Melissa Melissa [name] valve does not extend life but reduces symptoms. Discussed medication options, including SGLT2 inhibitors for fluid management and kidney protection, with risks of urinary tract infections. -  Increase diuretic dose with an additional 40 mg of Lasix as needed for swelling (80 mg PO Daily) - Repeat echocardiogram annually.  Peripheral Edema - Mild peripheral edema likely secondary to tricuspid regurgitation. Reports leg swelling and soreness, which fluctuates with activity levels. Discussed conservative management with increased diuretics and potential use of SGLT2 inhibitors if needed. - Consider SGLT2 inhibitor (Jardiance) if conservative management is insufficient. Monitor for urinary tract infections if initiated.  Spinal Stenosis - Chronic spinal stenosis causing significant back pain, occasionally affecting breathing. Undergoing injections for pain management but has not yet found effective relief. - Continue with current pain management plan, including injections.  HTN - well managed on current therapy  If no improvement in spinal stenosis leg pain, given history of PAD without improvement on medical therapy, low threshold to refer to Dr. Allyson Sabal and repeat ABI /duplex  Moderate TR with LE swelling and DOE With suspected underlying CKD NOS HTN - lasix  80 mg PO Daily and K 40 meq - BNP and Bmp today - may add Farxia; we discussed risk and benefits - will space out echo to one year - we have reviewed T-TEER; at 24 and with her comorbidity we plan for medical management of her disease  One year with me post echo    Medication Adjustments/Labs and Tests Ordered: Current medicines are reviewed at length with Melissa Melissa [name] Melissa [name] today.  Concerns regarding medicines are outlined above.  Orders Placed This Encounter  Procedures   EKG 12-Lead    Meds ordered this encounter  Medications   furosemide (LASIX) 40 MG tablet    Sig: Take 1 tablet (40 mg total) by mouth daily as needed.    Dispense:  30 tablet    Refill:  11     Melissa [name] Instructions  Medication Instructions:  Your physician has recommended you make Melissa Melissa [name] following change in your medication:  START: furosemide (Lasix) 40 mg by mouth once daily as needed for leg swelling  If you are taking furosemide more than 2-3 times weekly please call our office.   *If you need a refill on your cardiac medications before your next appointment, please call your pharmacy*   Lab Work: NONE  If you have labs (blood work) drawn today and your tests are completely normal, you will receive your results only by: MyChart Message (if you have MyChart) OR A paper copy in Melissa Melissa [name] mail If you have any lab test that is abnormal or we need to change your treatment, we will call you to review Melissa Melissa [name] results.   Testing/Procedures: NONE   Follow-Up: At [facility], you and your health needs are our priority.  As part of our continuing mission to provide you with exceptional heart care, we have created designated Provider Care Teams.  These Care Teams include your primary Cardiologist (physician) and Advanced Practice Providers (APPs -  Physician Assistants and Nurse Practitioners) who all work together to provide you with Melissa Melissa [name] care you need, when you need it.  We recommend signing up for Melissa Melissa [name] Melissa [name]  portal called "MyChart".  Sign up information is provided on this After Visit Summary.  MyChart is used to connect with patients for Virtual Visits (Telemedicine).  Patients are able to view lab/test results, encounter notes, upcoming appointments, etc.  Non-urgent messages can be sent to your provider as well.   To learn more about what you can do with MyChart, go to [email or web address].    Your next appointment:   1 year(s)  Provider:  Christell Constant, MD        Signed, Melissa Melissa [name] Constant, MD  06/29/2023 12:22 PM    [facility]

## 2023-06-29 NOTE — Patient Instructions (Signed)
Medication Instructions:  Your physician has recommended you make the following change in your medication:  START: furosemide (Lasix) 40 mg by mouth once daily as needed for leg swelling  If you are taking furosemide more than 2-3 times weekly please call our office.   *If you need a refill on your cardiac medications before your next appointment, please call your pharmacy*   Lab Work: NONE  If you have labs (blood work) drawn today and your tests are completely normal, you will receive your results only by: MyChart Message (if you have MyChart) OR A paper copy in the mail If you have any lab test that is abnormal or we need to change your treatment, we will call you to review the results.   Testing/Procedures: NONE   Follow-Up: At [facility], you and your health needs are our priority.  As part of our continuing mission to provide you with exceptional heart care, we have created designated Provider Care Teams.  These Care Teams include your primary Cardiologist (physician) and Advanced Practice Providers (APPs -  Physician Assistants and Nurse Practitioners) who all work together to provide you with the care you need, when you need it.  We recommend signing up for the patient portal called "MyChart".  Sign up information is provided on this After Visit Summary.  MyChart is used to connect with patients for Virtual Visits (Telemedicine).  Patients are able to view lab/test results, encounter notes, upcoming appointments, etc.  Non-urgent messages can be sent to your provider as well.   To learn more about what you can do with MyChart, go to [email or web address].    Your next appointment:   1 year(s)  Provider:   Christell Constant, MD

## 2023-07-12 DIAGNOSIS — M48062 Spinal stenosis, lumbar region with neurogenic claudication: Secondary | ICD-10-CM | POA: Diagnosis not present

## 2023-08-09 DIAGNOSIS — M48062 Spinal stenosis, lumbar region with neurogenic claudication: Secondary | ICD-10-CM | POA: Diagnosis not present

## 2023-08-25 DIAGNOSIS — H40013 Open angle with borderline findings, low risk, bilateral: Secondary | ICD-10-CM | POA: Diagnosis not present

## 2023-08-25 DIAGNOSIS — H43813 Vitreous degeneration, bilateral: Secondary | ICD-10-CM | POA: Diagnosis not present

## 2023-08-25 DIAGNOSIS — H5213 Myopia, bilateral: Secondary | ICD-10-CM | POA: Diagnosis not present

## 2023-08-25 DIAGNOSIS — H04123 Dry eye syndrome of bilateral lacrimal glands: Secondary | ICD-10-CM | POA: Diagnosis not present

## 2023-08-25 DIAGNOSIS — H524 Presbyopia: Secondary | ICD-10-CM | POA: Diagnosis not present

## 2023-08-25 DIAGNOSIS — H52223 Regular astigmatism, bilateral: Secondary | ICD-10-CM | POA: Diagnosis not present

## 2023-08-26 DIAGNOSIS — M48062 Spinal stenosis, lumbar region with neurogenic claudication: Secondary | ICD-10-CM | POA: Diagnosis not present

## 2023-09-06 DIAGNOSIS — M47816 Spondylosis without myelopathy or radiculopathy, lumbar region: Secondary | ICD-10-CM | POA: Diagnosis not present

## 2023-09-06 DIAGNOSIS — M461 Sacroiliitis, not elsewhere classified: Secondary | ICD-10-CM | POA: Diagnosis not present

## 2023-09-06 DIAGNOSIS — Z6832 Body mass index (BMI) 32.0-32.9, adult: Secondary | ICD-10-CM | POA: Diagnosis not present

## 2023-09-06 DIAGNOSIS — M48062 Spinal stenosis, lumbar region with neurogenic claudication: Secondary | ICD-10-CM | POA: Diagnosis not present

## 2023-09-08 DIAGNOSIS — M461 Sacroiliitis, not elsewhere classified: Secondary | ICD-10-CM | POA: Diagnosis not present

## 2023-09-09 ENCOUNTER — Other Ambulatory Visit: Payer: Medicare Other

## 2023-09-09 DIAGNOSIS — I10 Essential (primary) hypertension: Secondary | ICD-10-CM

## 2023-09-09 DIAGNOSIS — E782 Mixed hyperlipidemia: Secondary | ICD-10-CM | POA: Diagnosis not present

## 2023-09-10 LAB — COMPLETE METABOLIC PANEL WITH GFR
AG Ratio: 1.7 (calc) (ref 1.0–2.5)
ALT: 20 U/L (ref 6–29)
AST: 24 U/L (ref 10–35)
Albumin: 4.7 g/dL (ref 3.6–5.1)
Alkaline phosphatase (APISO): 51 U/L (ref 37–153)
BUN/Creatinine Ratio: 20 (calc) (ref 6–22)
BUN: 19 mg/dL (ref 7–25)
CO2: 26 mmol/L (ref 20–32)
Calcium: 10.6 mg/dL — ABNORMAL HIGH (ref 8.6–10.4)
Chloride: 103 mmol/L (ref 98–110)
Creat: 0.97 mg/dL — ABNORMAL HIGH (ref 0.60–0.95)
Globulin: 2.7 g/dL (ref 1.9–3.7)
Glucose, Bld: 86 mg/dL (ref 65–99)
Potassium: 3.9 mmol/L (ref 3.5–5.3)
Sodium: 140 mmol/L (ref 135–146)
Total Bilirubin: 0.8 mg/dL (ref 0.2–1.2)
Total Protein: 7.4 g/dL (ref 6.1–8.1)
eGFR: 56 mL/min/{1.73_m2} — ABNORMAL LOW (ref >=60)

## 2023-09-10 LAB — LIPID PANEL
Cholesterol: 173 mg/dL (ref <200)
HDL: 70 mg/dL (ref >=50)
LDL Cholesterol (Calc): 84 mg/dL
Non-HDL Cholesterol (Calc): 103 mg/dL (ref <130)
Total CHOL/HDL Ratio: 2.5 (calc) (ref <5.0)
Triglycerides: 95 mg/dL (ref <150)

## 2023-09-10 NOTE — Progress Notes (Signed)
 Patient Care Team: Margaree Mackintosh, MD as PCP - General (Internal Medicine) Christell Constant, MD as PCP - Cardiology (Cardiology) Eldrige Pitkin, Luanna Cole, MD (Internal Medicine) Christell Constant, MD as Consulting Physician (Cardiology)  Visit Date: 09/13/23  Subjective:   Chief Complaint  Patient presents with   Follow-up    Follow up 6 Month    Patient PP:IRJJOAC Cervenka,Female DOB:04/23/1931,89 y.o. ZYS:[id number]   89 y.o. Female presents today for 6 months follow-up for HTN, Memory Loss, & Anxiety/Depression. Patient has a past medical history of chronic back pain, hypertension, hyperlipidemia, anxiety and depression as well as mild memory loss . Seen 03/08/23 for her annual visit, in the interim she has been seen several times throughout the rest of 2024 by Letta Kocher for lumbar spondylosis w/o myelopathy or radiculopathy (once by Rosendo Gros) and also Jenell Milliner for lumbar spinal stenosis w/ neurogenic claudication. Saw Dr. Riley Lam with Cardiology on June 21 2023 for severe tricuspid regurgitation; HLD; PAD. Daughter is here with her today to help provide some additional information. Ambulating with a cane.   She reports she has been having an issue with her foot. Recommended Dr. Charlsie Merles at [facility].   History of Hypertension treated with 80 mg Valsartan, is taking 40 mg Lasix daily as needed for edema. Blood Pressure: normotensive at 105/68. Does endorse LE swelling.  History of Hyperlipidemia treated with 80 mg Atorvastatin daily. 09/09/23 Lipid Panel WNL.   History of Anxiety/Depression treated with 0.5 mg Xanax BID and 100 mg Zoloft daily.   History of Osteoporosis treated with Prolia. Given today.   History of Back Pain started on 15 mg Meloxicam. Reports that she doesn't feel Meloxicam was helping pain so she has stopped taking it. Notes she has tried injections in her back, which has not worked. Says Gabapentin did not help either.  Endorses pain on ambulation. States that PT has been helping.   09/09/23 C-Met, compared to 03/04/23: Creatinine 0.97, elevated from 0.87; eGFR 56, decreased from 64-still good for age. Calcium 10.6, elevated from 9.5.   Vaccine Counseling: discussed Shingles as she has not had this done - she declines  Allergies  Allergen Reactions   Aspirin Nausea Only    Nausea, dizziness, malaise.   Codeine     Fatigue, GI Intolerance   Tramadol     fatigue    Past Medical History:  Diagnosis Date   Allergy    Alzheimer disease (HCC)    Anxiety    Bladder dysfunction    Bulging lumbar disc    Depression    Hyperlipidemia    Hypertension    Low back pain    Lumbar radiculopathy    Spinal stenosis     Family History  Problem Relation Age of Onset   Arthritis Mother    Alcohol abuse Father    Anxiety disorder Sister    Anxiety disorder Granddaughter    Anxiety disorder Niece    Social History   Social History Narrative   01/06/22 lives with [name]   Review of Systems  Constitutional:  Negative for fever and malaise/fatigue.  HENT:  Negative for congestion.   Eyes:  Negative for blurred vision.  Respiratory:  Negative for cough and shortness of breath.   Cardiovascular:  Positive for leg swelling. Negative for chest pain and palpitations.  Gastrointestinal:  Negative for vomiting.  Musculoskeletal:  Positive for back pain.       (+) Foot issue  Skin:  Negative  for rash.  Neurological:  Negative for loss of consciousness and headaches.     Objective:  Vitals: BP 105/68   Pulse 90   Temp (!) 97.4 F (36.3 C) (Oral)   Resp 16   Ht 4\' 11"  (1.499 m)   Wt 143 lb 6.4 oz (65 kg)   SpO2 95%   BMI 28.96 kg/m   Physical Exam Vitals and nursing note reviewed.  Constitutional:      General: She is not in acute distress.    Appearance: Normal appearance. She is not toxic-appearing.  HENT:     Head: Normocephalic and atraumatic.  Cardiovascular:     Rate and Rhythm: Normal  rate and regular rhythm. No extrasystoles are present.    Pulses: Normal pulses.     Heart sounds: Normal heart sounds. No murmur heard.    No friction rub. No gallop.  Pulmonary:     Effort: Pulmonary effort is normal. No respiratory distress.     Breath sounds: Normal breath sounds. No wheezing or rales.  Skin:    General: Skin is warm and dry.  Neurological:     Mental Status: She is alert and oriented to person, place, and time. Mental status is at baseline.  Psychiatric:        Mood and Affect: Mood normal.        Behavior: Behavior normal.        Thought Content: Thought content normal.        Judgment: Judgment normal.     Results:  Studies Obtained And Personally Reviewed By Me: Labs:     Component Value Date/Time   NA 140 09/09/2023 0920   NA 141 07/01/2022 1029   K 3.9 09/09/2023 0920   CL 103 09/09/2023 0920   CO2 26 09/09/2023 0920   GLUCOSE 86 09/09/2023 0920   BUN 19 09/09/2023 0920   BUN 19 07/01/2022 1029   CREATININE 0.97 (H) 09/09/2023 0920   CALCIUM 10.6 (H) 09/09/2023 0920   PROT 7.4 09/09/2023 0920   PROT 6.5 03/16/2022 0935   ALBUMIN 4.0 03/16/2022 0935   AST 24 09/09/2023 0920   ALT 20 09/09/2023 0920   ALKPHOS 88 03/16/2022 0935   BILITOT 0.8 09/09/2023 0920   BILITOT 0.5 03/16/2022 0935   GFRNONAA 57 (L) 11/10/2022 1300   GFRNONAA 65 02/27/2020 0918   GFRAA 75 02/27/2020 0918    Lab Results  Component Value Date   WBC 3.5 (L) 03/04/2023   HGB 12.0 03/04/2023   HCT 36.4 03/04/2023   MCV 83.1 03/04/2023   PLT 199 03/04/2023   Lab Results  Component Value Date   CHOL 173 09/09/2023   HDL 70 09/09/2023   LDLCALC 84 09/09/2023   TRIG 95 09/09/2023   CHOLHDL 2.5 09/09/2023    Lab Results  Component Value Date   TSH 2.55 03/04/2023   Assessment & Plan:  She reports she has been having an issue with her foot. Recommended Dr. Charlsie Merles at [facility].   Hypertension treated with 80 mg Valsartan, is taking 40 mg Lasix daily as needed for  edema. Blood Pressure: normotensive at 105/68. Does endorse LE swelling.  Hyperlipidemia treated with 80 mg Atorvastatin daily. 09/09/23 Lipid Panel WNL.   Anxiety/Depression treated with 0.5 mg Xanax BID and 100 mg Zoloft daily.   Osteoporosis treated with Prolia. Given today.   Back Pain started on 15 mg Meloxicam. Reports that she doesn't feel relieved with the Meloxicam, so she has stopped taking it. Notes  she has tried injections in her back, which has not worked. Neither has Gabapentin. Endorses pain on ambulation. PDM score 10 - sending in 5-325 mg Norco - take 1 tablet every 6 hours as needed for pain  Reviewed 09/09/23 C-Met, compared to 03/04/23: Creatinine 0.97, elevated from 0.87; eGFR 56, decreased from 64; Calcium 10.6, elevated from 9.5.   Vaccine Counseling: discussed Shingles as she has not had this done - she declines   Follow up in 6 months for annual exam and medicare wellness visit.   I,Emily Lagle,acting as a Neurosurgeon for Margaree Mackintosh, MD.,have documented all relevant documentation on the behalf of Margaree Mackintosh, MD,as directed by  Margaree Mackintosh, MD while in the presence of Margaree Mackintosh, MD.   I, Margaree Mackintosh, MD, have reviewed all documentation for this visit. The documentation on 09/25/23 for the exam, diagnosis, procedures, and orders are all accurate and complete.

## 2023-09-13 ENCOUNTER — Ambulatory Visit (INDEPENDENT_AMBULATORY_CARE_PROVIDER_SITE_OTHER): Payer: Medicare Other | Admitting: Internal Medicine

## 2023-09-13 VITALS — BP 105/68 | HR 90 | Temp 97.4°F | Resp 16 | Ht 59.0 in | Wt 143.4 lb

## 2023-09-13 DIAGNOSIS — M810 Age-related osteoporosis without current pathological fracture: Secondary | ICD-10-CM

## 2023-09-13 DIAGNOSIS — I071 Rheumatic tricuspid insufficiency: Secondary | ICD-10-CM | POA: Diagnosis not present

## 2023-09-13 DIAGNOSIS — F419 Anxiety disorder, unspecified: Secondary | ICD-10-CM | POA: Diagnosis not present

## 2023-09-13 DIAGNOSIS — I10 Essential (primary) hypertension: Secondary | ICD-10-CM | POA: Diagnosis not present

## 2023-09-13 DIAGNOSIS — M4807 Spinal stenosis, lumbosacral region: Secondary | ICD-10-CM

## 2023-09-13 DIAGNOSIS — M549 Dorsalgia, unspecified: Secondary | ICD-10-CM | POA: Diagnosis not present

## 2023-09-13 DIAGNOSIS — E782 Mixed hyperlipidemia: Secondary | ICD-10-CM | POA: Diagnosis not present

## 2023-09-13 DIAGNOSIS — F32A Depression, unspecified: Secondary | ICD-10-CM

## 2023-09-13 DIAGNOSIS — G8929 Other chronic pain: Secondary | ICD-10-CM | POA: Diagnosis not present

## 2023-09-13 MED ORDER — DENOSUMAB 60 MG/ML ~~LOC~~ SOSY
60.0000 mg | PREFILLED_SYRINGE | Freq: Once | SUBCUTANEOUS | Status: AC
Start: 2023-09-13 — End: 2023-09-13
  Administered 2023-09-13: 60 mg via SUBCUTANEOUS

## 2023-09-16 ENCOUNTER — Telehealth: Payer: Self-pay | Admitting: Internal Medicine

## 2023-09-16 MED ORDER — HYDROCODONE-ACETAMINOPHEN 5-325 MG PO TABS: ORAL_TABLET | ORAL | 0 refills | Status: DC

## 2023-09-16 NOTE — Telephone Encounter (Signed)
 Called patient to let her know medication had been sent in.

## 2023-09-16 NOTE — Telephone Encounter (Signed)
 Copied from CRM [id number]. Topic: Clinical - Prescription Issue >> Sep 16, 2023 11:38 AM Antonio DEL wrote: Reason for CRM: Patient says Dr. Perri was supposed to call her something in for her back pain, a low dose narcotic but she can't remember the name. She contacted her pharmacy and they don't have anything.

## 2023-09-19 ENCOUNTER — Other Ambulatory Visit: Payer: Self-pay | Admitting: Internal Medicine

## 2023-09-21 ENCOUNTER — Ambulatory Visit: Payer: Medicare Other | Admitting: Internal Medicine

## 2023-09-25 ENCOUNTER — Encounter: Payer: Self-pay | Admitting: Internal Medicine

## 2023-09-25 NOTE — Patient Instructions (Signed)
 It was a pleasure to see you today. Please continue sam meds and follow up in 6 months.

## 2023-10-04 DIAGNOSIS — M48062 Spinal stenosis, lumbar region with neurogenic claudication: Secondary | ICD-10-CM | POA: Diagnosis not present

## 2023-10-05 ENCOUNTER — Ambulatory Visit (INDEPENDENT_AMBULATORY_CARE_PROVIDER_SITE_OTHER): Payer: Medicare Other | Admitting: Podiatry

## 2023-10-05 DIAGNOSIS — B351 Tinea unguium: Secondary | ICD-10-CM

## 2023-10-05 DIAGNOSIS — M79675 Pain in left toe(s): Secondary | ICD-10-CM

## 2023-10-05 MED ORDER — CICLOPIROX 8 % EX SOLN: Freq: Every day | CUTANEOUS | 11 refills | Status: AC

## 2023-10-05 NOTE — Progress Notes (Unsigned)
     Chief Complaint  Patient presents with   Nail Problem    np Toe nail fungus-left hallux nail, some on right hallux and 2nd toe   HPI: 89 y.o. female presents today with concern of fungus in her left great toenail.  States that she had tried Fungi-Nail solution over-the-counter for the past 3 months but did not notice much improvement.  She would like to try to clear the fungus in the nails.  She notes that nail does have an active growth pattern.  Denies any recent injury to the great toenail.  Past Medical History:  Diagnosis Date   Allergy    Alzheimer disease (HCC)    Anxiety    Bladder dysfunction    Bulging lumbar disc    Depression    Hyperlipidemia    Hypertension    Low back pain    Lumbar radiculopathy    Spinal stenosis     Past Surgical History:  Procedure Laterality Date   bladder dysfunction     bladder stimulator     BREAST CYST EXCISION     CATARACT EXTRACTION     INTERSTIM IMPLANT REMOVAL N/A 11/17/2022   Procedure: REMOVAL OF INTERSTIM IMPLANT;  Surgeon: Noel Christmas, MD;  Location: [facility];  Service: Urology;  Laterality: N/A;  60 MINUTES   ROTATOR CUFF REPAIR Right    WISDOM TOOTH EXTRACTION      Allergies  Allergen Reactions   Aspirin Nausea Only    Nausea, dizziness, malaise.   Codeine     Fatigue, GI Intolerance   Tramadol     fatigue    Physical Exam: There are palpable pedal pulses.  The left hallux nail is 3 mm thick with yellow and brown discoloration, subungual debris, distal onycholysis and pain with compression.  No surrounding erythema or edema is noted.  Assessment/Plan of Care: 1. Pain in left toe(s)   2. Dermatophytosis of nail      Meds ordered this encounter  Medications   ciclopirox (PENLAC) 8 % solution    Sig: Apply topically at bedtime. Apply thin layer over nail. Apply daily over previous coat. Remove weekly with polish remover.    Dispense:  6.6 mL    Refill:  11   Discussed clinical findings with patient  today.  Will start the patient on prescription ciclopirox 8% solution to apply once daily to the toenail.  She was instructed to remove this once weekly with nail polish remover or rubbing alcohol.  This needs to be applied daily for up to 1 year.  Will recheck in approximately 3 months.   Clerance Lav, DPM, FACFAS [facility]     [address] [place], [place] [address]                Office [phone]  Fax [phone]

## 2023-10-19 ENCOUNTER — Other Ambulatory Visit (HOSPITAL_COMMUNITY): Payer: Self-pay

## 2023-11-04 ENCOUNTER — Other Ambulatory Visit: Payer: Self-pay | Admitting: Internal Medicine

## 2023-11-18 DIAGNOSIS — M461 Sacroiliitis, not elsewhere classified: Secondary | ICD-10-CM | POA: Diagnosis not present

## 2023-11-18 DIAGNOSIS — M7918 Myalgia, other site: Secondary | ICD-10-CM | POA: Diagnosis not present

## 2023-11-18 DIAGNOSIS — M48062 Spinal stenosis, lumbar region with neurogenic claudication: Secondary | ICD-10-CM | POA: Diagnosis not present

## 2023-12-03 ENCOUNTER — Ambulatory Visit (INDEPENDENT_AMBULATORY_CARE_PROVIDER_SITE_OTHER): Admitting: Internal Medicine

## 2023-12-03 VITALS — BP 130/80 | HR 60 | Temp 97.6°F | Ht <= 58 in | Wt 138.8 lb

## 2023-12-03 DIAGNOSIS — L639 Alopecia areata, unspecified: Secondary | ICD-10-CM | POA: Diagnosis not present

## 2023-12-03 DIAGNOSIS — L219 Seborrheic dermatitis, unspecified: Secondary | ICD-10-CM | POA: Diagnosis not present

## 2023-12-03 NOTE — Progress Notes (Signed)
 Patient Care Team: Sylvan Evener, MD as PCP - General (Internal Medicine) Jann Melody, MD as PCP - Cardiology (Cardiology) Steen Bisig, Jaynie Meyers, MD (Internal Medicine) Jann Melody, MD as Consulting Physician (Cardiology)  Visit Date: 12/03/23  Subjective:   Chief Complaint  Patient presents with   Hair/Scalp Problem    Itchy head. Asking for dermatology referral.  Patient YN:WGNFAOZ Cuthrell,Female DOB:[birth year],89 y.o. HYQ:[id number]   89 y.o. Female, here with her daughter, presents today for acute visit with Hair Loss; Pruritus of the Scalp. Patient has a past medical history of Anxiety. She says that she noticed the itching first and then her daughter pointed out that her hair has been thinning as well; has been trying Head&Shoulders and tea tree oil for the itching without relief. Notes that alopecia does run in her family, but since she is 43 she didn't think she'd be affected. Also say she's noticed the texture of her hair has changed since her other symptoms have started.   Her daughter says that she has been having issues with sleep: she has been going to bed late, waking up early, and napping throughout the day. Patient says that when she is lying in bed her thinking prevents her from falling asleep. Does take Xanax  0.5 mg twice daily and Remeron  7.5 - 15 mg at bedtime, but daughter says that she tends to take her Remeron  late into the night. Past Medical History:  Diagnosis Date   Allergy    Alzheimer disease (HCC)    Anxiety    Bladder dysfunction    Bulging lumbar disc    Depression    Hyperlipidemia    Hypertension    Low back pain    Lumbar radiculopathy    Spinal stenosis     Allergies  Allergen Reactions   Aspirin Nausea Only    Nausea, dizziness, malaise.   Codeine     Fatigue, GI Intolerance   Tramadol     fatigue    Family History  Problem Relation Age of Onset   Arthritis Mother    Alcohol abuse Father    Anxiety disorder  Sister    Anxiety disorder Granddaughter    Anxiety disorder Niece    Social History   Social History Narrative   01/06/22 lives with dgtr Willetta Harpin  Has great-grandchildren Review of Systems  Skin:  Positive for itching (scalp).       (+) Hair Loss/Texture Change     Objective:  Vitals: BP 130/80   Pulse 60   Temp 97.6 F (36.4 C) (Temporal)   Ht 4\' 10"  (1.473 m)   Wt 138 lb 12.8 oz (63 kg)   SpO2 91%   BMI 29.01 kg/m   Physical Exam Vitals and nursing note reviewed.  Constitutional:      General: She is not in acute distress.    Appearance: Normal appearance. She is not toxic-appearing.  HENT:     Head: Normocephalic and atraumatic.     Comments: Significant baldness around the top of head Scalp is rough to touch No open or draining lesions Pulmonary:     Effort: Pulmonary effort is normal.  Skin:    General: Skin is warm and dry.  Neurological:     Mental Status: She is alert and oriented to person, place, and time. Mental status is at baseline.  Psychiatric:        Mood and Affect: Mood normal.        Behavior: Behavior normal.  Thought Content: Thought content normal.        Judgment: Judgment normal.     Results:  Studies Obtained And Personally Reviewed By Me: Labs:     Component Value Date/Time   NA 140 09/09/2023 0920   NA 141 07/01/2022 1029   K 3.9 09/09/2023 0920   CL 103 09/09/2023 0920   CO2 26 09/09/2023 0920   GLUCOSE 86 09/09/2023 0920   BUN 19 09/09/2023 0920   BUN 19 07/01/2022 1029   CREATININE 0.97 (H) 09/09/2023 0920   CALCIUM  10.6 (H) 09/09/2023 0920   PROT 7.4 09/09/2023 0920   PROT 6.5 03/16/2022 0935   ALBUMIN 4.0 03/16/2022 0935   AST 24 09/09/2023 0920   ALT 20 09/09/2023 0920   ALKPHOS 88 03/16/2022 0935   BILITOT 0.8 09/09/2023 0920   BILITOT 0.5 03/16/2022 0935   GFRNONAA 57 (L) 11/10/2022 1300   GFRNONAA 65 02/27/2020 0918   GFRAA 75 02/27/2020 0918    Lab Results  Component Value Date   WBC 3.5 (L)  03/04/2023   HGB 12.0 03/04/2023   HCT 36.4 03/04/2023   MCV 83.1 03/04/2023   PLT 199 03/04/2023   Lab Results  Component Value Date   CHOL 173 09/09/2023   HDL 70 09/09/2023   LDLCALC 84 09/09/2023   TRIG 95 09/09/2023   CHOLHDL 2.5 09/09/2023   Lab Results  Component Value Date   TSH 2.55 03/04/2023   Assessment & Plan:  Scalp Dermatitis: scalp itching and hair has been thinning as well over the past month; has been trying Head&Shoulders and tea tree oil for the itching without relief. Notes that alopecia does run in her family. Also she's noticed the texture of her hair has changed since her other symptoms have started. Patient agreeable to seeing Dermatologist. Scalp is very dry and has rough texture.  Alopecia areata- has alopecia central scalp. Scalp is rough to touch.     I,Emily Lagle,acting as a Neurosurgeon for Sylvan Evener, MD.,have documented all relevant documentation on the behalf of Sylvan Evener, MD,as directed by  Sylvan Evener, MD while in the presence of Sylvan Evener, MD.   I, Sylvan Evener, MD, have reviewed all documentation for this visit. The documentation on 12/06/23 for the exam, diagnosis, procedures, and orders are all accurate and complete.

## 2023-12-06 ENCOUNTER — Encounter: Payer: Self-pay | Admitting: Internal Medicine

## 2023-12-12 ENCOUNTER — Other Ambulatory Visit: Payer: Self-pay | Admitting: Internal Medicine

## 2023-12-20 ENCOUNTER — Other Ambulatory Visit: Payer: Self-pay | Admitting: *Deleted

## 2023-12-20 ENCOUNTER — Ambulatory Visit (INDEPENDENT_AMBULATORY_CARE_PROVIDER_SITE_OTHER): Payer: Medicare Other | Admitting: Podiatry

## 2023-12-20 ENCOUNTER — Encounter: Payer: Self-pay | Admitting: Podiatry

## 2023-12-20 DIAGNOSIS — B351 Tinea unguium: Secondary | ICD-10-CM

## 2023-12-20 DIAGNOSIS — L03032 Cellulitis of left toe: Secondary | ICD-10-CM

## 2023-12-20 DIAGNOSIS — M810 Age-related osteoporosis without current pathological fracture: Secondary | ICD-10-CM

## 2023-12-20 MED ORDER — DENOSUMAB 60 MG/ML ~~LOC~~ SOSY
60.0000 mg | PREFILLED_SYRINGE | SUBCUTANEOUS | Status: AC
Start: 2024-03-08 — End: 2024-09-03

## 2023-12-20 MED ORDER — CEPHALEXIN 500 MG PO CAPS: 500.0000 mg | ORAL_CAPSULE | Freq: Two times a day (BID) | ORAL | 0 refills | Status: AC

## 2023-12-20 NOTE — Progress Notes (Signed)
  Chief Complaint  Patient presents with   Nail Problem    RM#16 Follow up on left foot big toe nail fungus. Toe redness after cutting the nail.    HPI: 89 y.o. female presents today for follow-up of left toenail fungus.  She has been using the ciclopirox  lacquer on the nail.  She did work on the nail and tried to trim some back and accidentally cut her skin 1 to 2 days ago.  There is some redness around the nail today.  Denies pain.  Past Medical History:  Diagnosis Date   Allergy    Alzheimer disease (HCC)    Anxiety    Bladder dysfunction    Bulging lumbar disc    Depression    Hyperlipidemia    Hypertension    Low back pain    Lumbar radiculopathy    Spinal stenosis    Past Surgical History:  Procedure Laterality Date   bladder dysfunction     bladder stimulator     BREAST CYST EXCISION     CATARACT EXTRACTION     INTERSTIM IMPLANT REMOVAL N/A 11/17/2022   Procedure: REMOVAL OF INTERSTIM IMPLANT;  Surgeon: Roxane Copp, MD;  Location: [facility];  Service: Urology;  Laterality: N/A;  60 MINUTES   ROTATOR CUFF REPAIR Right    WISDOM TOOTH EXTRACTION     Allergies  Allergen Reactions   Aspirin Nausea Only    Nausea, dizziness, malaise.   Codeine     Fatigue, GI Intolerance   Tramadol     fatigue    Physical Exam: Palpable pulses noted of the left foot.  There is still some fungal involvement of the middle to distal 50% of the toenail, but there is proximal clearing of the nail since she has been using the ciclopirox  lacquer.  There is periungual erythema and calor noted and there is evidence of recent injury to the lateral nail margin with the patient try to cut her own nails.  This is concerning for possible early infection.  Assessment/Plan of Care: 1. Cellulitis of left toe   2. Dermatophytosis of nail      Meds ordered this encounter  Medications   cephALEXin (KEFLEX) 500 MG capsule    Sig: Take 1 capsule (500 mg total) by mouth 2 (two) times daily for 7  days.    Dispense:  14 capsule    Refill:  0   The left hallux nail was debrided with sterile nail nippers and a debriding bur.  She will continue with the ciclopirox  daily to the toenail.  For the cellulitis, will send in cephalexin 500 mg 1 tab p.o. twice daily to her pharmacy.  Patient instructed to keep an eye on the toe, if the redness and warmth does not improve in the next 24 to 48 hours then I want her to finish all of the oral medication.  F/u 3 months   Nymir Ringler DEstle Hemp, DPM, FACFAS [facility]     [address] [place], [place] [address]                Office [phone]  Fax [phone]

## 2023-12-28 DIAGNOSIS — L638 Other alopecia areata: Secondary | ICD-10-CM | POA: Diagnosis not present

## 2023-12-29 ENCOUNTER — Other Ambulatory Visit: Payer: Self-pay | Admitting: Internal Medicine

## 2023-12-29 DIAGNOSIS — L638 Other alopecia areata: Secondary | ICD-10-CM | POA: Diagnosis not present

## 2023-12-29 DIAGNOSIS — Z79899 Other long term (current) drug therapy: Secondary | ICD-10-CM | POA: Diagnosis not present

## 2024-01-11 DIAGNOSIS — L638 Other alopecia areata: Secondary | ICD-10-CM | POA: Diagnosis not present

## 2024-01-12 ENCOUNTER — Encounter: Payer: Self-pay | Admitting: Internal Medicine

## 2024-01-13 ENCOUNTER — Other Ambulatory Visit: Payer: Self-pay

## 2024-01-13 DIAGNOSIS — R768 Other specified abnormal immunological findings in serum: Secondary | ICD-10-CM

## 2024-01-19 DIAGNOSIS — M461 Sacroiliitis, not elsewhere classified: Secondary | ICD-10-CM | POA: Diagnosis not present

## 2024-01-19 DIAGNOSIS — M48062 Spinal stenosis, lumbar region with neurogenic claudication: Secondary | ICD-10-CM | POA: Diagnosis not present

## 2024-01-20 ENCOUNTER — Other Ambulatory Visit: Payer: Self-pay | Admitting: Internal Medicine

## 2024-02-09 DIAGNOSIS — M461 Sacroiliitis, not elsewhere classified: Secondary | ICD-10-CM | POA: Diagnosis not present

## 2024-02-09 DIAGNOSIS — M48062 Spinal stenosis, lumbar region with neurogenic claudication: Secondary | ICD-10-CM | POA: Diagnosis not present

## 2024-02-10 ENCOUNTER — Telehealth: Payer: Self-pay

## 2024-02-10 NOTE — Telephone Encounter (Signed)
 Prolia  VOB initiated via [email or web address]  Next Prolia  inj DUE: 03/12/24

## 2024-02-15 NOTE — Telephone Encounter (Signed)
 Pt ready for scheduling for PROLIA  on or after : 03/12/24  Option# 1: Buy/Bill (Office supplied medication)  Out-of-pocket cost due at time of clinic visit: $0  Number of injection/visits approved: ---  Primary: MEDICARE Prolia  co-insurance: 0% Admin fee co-insurance: 0%  Secondary: [place] [employer] [place] Prolia  co-insurance:  Admin fee co-insurance:   Medical Benefit Details: Date Benefits were checked: 02/15/24 Deductible: $257 Met of $257 Required/ Coinsurance: 0%/ Admin Fee: 0%  Prior Auth: N/A PA# Expiration Date:   # of doses approved: ----------------------------------------------------------------------- Option# 2- Med Obtained from pharmacy:  Pharmacy benefit: Copay $--- (Paid to pharmacy) Admin Fee: --- (Pay at clinic)  Prior Auth: --- PA# Expiration Date:   # of doses approved:   If patient wants fill through the pharmacy benefit please send prescription to: ---, and include estimated need by date in rx notes. Pharmacy will ship medication directly to the office.  Patient NOT eligible for Prolia  Copay Card. Copay Card can make patient's cost as little as $25. Link to apply: [email or web address]://www.[email or web address]/copay  ** This summary of benefits is an estimation of the patient's out-of-pocket cost. Exact cost may very based on individual plan coverage.

## 2024-02-15 NOTE — Telephone Encounter (Signed)
 Melissa [name]

## 2024-02-24 ENCOUNTER — Other Ambulatory Visit: Payer: Self-pay | Admitting: Internal Medicine

## 2024-02-28 ENCOUNTER — Telehealth: Payer: Self-pay | Admitting: *Deleted

## 2024-02-28 DIAGNOSIS — M810 Age-related osteoporosis without current pathological fracture: Secondary | ICD-10-CM

## 2024-02-28 MED ORDER — DENOSUMAB 60 MG/ML ~~LOC~~ SOSY
60.0000 mg | PREFILLED_SYRINGE | Freq: Once | SUBCUTANEOUS | 0 refills | Status: AC
Start: 2024-02-28 — End: 2024-02-28

## 2024-02-28 NOTE — Telephone Encounter (Signed)
 Pt will get prolia  injection at [facility] on 03/21/24.  Please order.

## 2024-02-28 NOTE — Telephone Encounter (Signed)
 Ordered

## 2024-03-01 DIAGNOSIS — M48061 Spinal stenosis, lumbar region without neurogenic claudication: Secondary | ICD-10-CM | POA: Diagnosis not present

## 2024-03-01 DIAGNOSIS — M47816 Spondylosis without myelopathy or radiculopathy, lumbar region: Secondary | ICD-10-CM | POA: Diagnosis not present

## 2024-03-01 DIAGNOSIS — G544 Lumbosacral root disorders, not elsewhere classified: Secondary | ICD-10-CM | POA: Diagnosis not present

## 2024-03-14 ENCOUNTER — Other Ambulatory Visit: Payer: Self-pay | Admitting: Internal Medicine

## 2024-03-16 ENCOUNTER — Other Ambulatory Visit: Payer: Self-pay | Admitting: Internal Medicine

## 2024-03-17 ENCOUNTER — Other Ambulatory Visit: Payer: Medicare Other

## 2024-03-17 DIAGNOSIS — G8929 Other chronic pain: Secondary | ICD-10-CM

## 2024-03-17 DIAGNOSIS — Z1329 Encounter for screening for other suspected endocrine disorder: Secondary | ICD-10-CM | POA: Diagnosis not present

## 2024-03-17 DIAGNOSIS — M4807 Spinal stenosis, lumbosacral region: Secondary | ICD-10-CM | POA: Diagnosis not present

## 2024-03-17 DIAGNOSIS — F32A Depression, unspecified: Secondary | ICD-10-CM

## 2024-03-17 DIAGNOSIS — M810 Age-related osteoporosis without current pathological fracture: Secondary | ICD-10-CM

## 2024-03-17 DIAGNOSIS — E782 Mixed hyperlipidemia: Secondary | ICD-10-CM

## 2024-03-17 DIAGNOSIS — Z0000 Encounter for general adult medical examination without abnormal findings: Secondary | ICD-10-CM | POA: Diagnosis not present

## 2024-03-17 DIAGNOSIS — M549 Dorsalgia, unspecified: Secondary | ICD-10-CM | POA: Diagnosis not present

## 2024-03-17 DIAGNOSIS — I10 Essential (primary) hypertension: Secondary | ICD-10-CM

## 2024-03-17 DIAGNOSIS — F419 Anxiety disorder, unspecified: Secondary | ICD-10-CM | POA: Diagnosis not present

## 2024-03-18 LAB — CBC WITH DIFFERENTIAL/PLATELET
Absolute Lymphocytes: 1250 {cells}/uL (ref 850–3900)
Absolute Monocytes: 418 {cells}/uL (ref 200–950)
Basophils Absolute: 38 {cells}/uL (ref 0–200)
Basophils Relative: 1 %
Eosinophils Absolute: 266 {cells}/uL (ref 15–500)
Eosinophils Relative: 7 %
HCT: 35.8 % (ref 35.0–45.0)
Hemoglobin: 11.1 g/dL — ABNORMAL LOW (ref 11.7–15.5)
MCH: 27.1 pg (ref 27.0–33.0)
MCHC: 31 g/dL — ABNORMAL LOW (ref 32.0–36.0)
MCV: 87.5 fL (ref 80.0–100.0)
MPV: 11.4 fL (ref 7.5–12.5)
Monocytes Relative: 11 %
Neutro Abs: 1828 {cells}/uL (ref 1500–7800)
Neutrophils Relative %: 48.1 %
Platelets: 184 Thousand/uL (ref 140–400)
RBC: 4.09 Million/uL (ref 3.80–5.10)
RDW: 14.1 % (ref 11.0–15.0)
Total Lymphocyte: 32.9 %
WBC: 3.8 Thousand/uL (ref 3.8–10.8)

## 2024-03-18 LAB — LIPID PANEL
Cholesterol: 179 mg/dL (ref <200)
HDL: 77 mg/dL (ref >=50)
LDL Cholesterol (Calc): 87 mg/dL
Non-HDL Cholesterol (Calc): 102 mg/dL (ref <130)
Total CHOL/HDL Ratio: 2.3 (calc) (ref <5.0)
Triglycerides: 64 mg/dL (ref <150)

## 2024-03-18 LAB — COMPLETE METABOLIC PANEL WITHOUT GFR
AG Ratio: 1.7 (calc) (ref 1.0–2.5)
ALT: 15 U/L (ref 6–29)
AST: 16 U/L (ref 10–35)
Albumin: 4.3 g/dL (ref 3.6–5.1)
Alkaline phosphatase (APISO): 53 U/L (ref 37–153)
BUN: 24 mg/dL (ref 7–25)
CO2: 29 mmol/L (ref 20–32)
Calcium: 9.8 mg/dL (ref 8.6–10.4)
Chloride: 106 mmol/L (ref 98–110)
Creat: 0.91 mg/dL (ref 0.60–0.95)
Globulin: 2.6 g/dL (ref 1.9–3.7)
Glucose, Bld: 83 mg/dL (ref 65–99)
Potassium: 3.6 mmol/L (ref 3.5–5.3)
Sodium: 142 mmol/L (ref 135–146)
Total Bilirubin: 0.6 mg/dL (ref 0.2–1.2)
Total Protein: 6.9 g/dL (ref 6.1–8.1)

## 2024-03-18 LAB — TSH: TSH: 2.64 m[IU]/L (ref 0.40–4.50)

## 2024-03-21 ENCOUNTER — Ambulatory Visit: Payer: Medicare Other | Admitting: Internal Medicine

## 2024-03-21 VITALS — BP 120/80 | HR 77 | Ht <= 58 in | Wt 136.0 lb

## 2024-03-21 DIAGNOSIS — L03031 Cellulitis of right toe: Secondary | ICD-10-CM | POA: Diagnosis not present

## 2024-03-21 DIAGNOSIS — B351 Tinea unguium: Secondary | ICD-10-CM | POA: Diagnosis not present

## 2024-03-21 DIAGNOSIS — E782 Mixed hyperlipidemia: Secondary | ICD-10-CM

## 2024-03-21 DIAGNOSIS — R600 Localized edema: Secondary | ICD-10-CM | POA: Diagnosis not present

## 2024-03-21 DIAGNOSIS — E876 Hypokalemia: Secondary | ICD-10-CM

## 2024-03-21 DIAGNOSIS — I10 Essential (primary) hypertension: Secondary | ICD-10-CM | POA: Diagnosis not present

## 2024-03-21 DIAGNOSIS — N319 Neuromuscular dysfunction of bladder, unspecified: Secondary | ICD-10-CM | POA: Diagnosis not present

## 2024-03-21 DIAGNOSIS — M48062 Spinal stenosis, lumbar region with neurogenic claudication: Secondary | ICD-10-CM

## 2024-03-21 DIAGNOSIS — I071 Rheumatic tricuspid insufficiency: Secondary | ICD-10-CM | POA: Diagnosis not present

## 2024-03-21 DIAGNOSIS — F419 Anxiety disorder, unspecified: Secondary | ICD-10-CM | POA: Diagnosis not present

## 2024-03-21 DIAGNOSIS — F32A Depression, unspecified: Secondary | ICD-10-CM | POA: Diagnosis not present

## 2024-03-21 DIAGNOSIS — Z0000 Encounter for general adult medical examination without abnormal findings: Secondary | ICD-10-CM

## 2024-03-21 MED ORDER — CEPHALEXIN 500 MG PO CAPS: 500.0000 mg | ORAL_CAPSULE | Freq: Two times a day (BID) | ORAL | 0 refills | Status: DC

## 2024-03-21 NOTE — Progress Notes (Addendum)
 Annual Wellness Visit   Patient Care Team: Efe Fazzino, Ronal PARAS, MD as PCP - General (Internal Medicine) Santo Stanly LABOR, MD as PCP - Cardiology (Cardiology) Anuar Walgren, Ronal PARAS, MD (Internal Medicine) Santo Stanly LABOR, MD as Consulting Physician (Cardiology)  Visit Date: 03/21/24   Chief Complaint  Patient presents with   Annual Exam   Subjective:  Patient: Melissa [name], Female DOB: [birth year], 89 y.o. MRN: [id number]  Melissa [name] is a 89 y.o. Female, who presents today, accompanied by her daughter, for her Annual Wellness Visit. Patient has Anxiety State; Hyperlipidemia; Essential Hypertension; PAD (Peripheral Artery Disease); Nonrheumatic Tricuspid Valve Regurgitation; and Lumbar Spinal Stenosis.  Her daughter informs that [name] usually goes to bed around 3-4 AM, though still wakes up early. She eats twice daily, but did have a recent vomiting spell and appetite has been decreased. Yesterday, she did suffer a mild fall trying to pluck weeds, however there are no injuries associated.   Since 09/2023, when she weighed 143 lbs, she has lost 7 pounds and today weighs 126 lbsBMI 28.42.  History of Hypertension treated with Valsartan  80 mg; Dependent Edema treated with Lasix  40 mg as needed. Blood Pressure: normotensive today at 120/80. Hypokalemia, Secondary to Diuretic Therapy, treated with Klor-Con  40 meq daily and 03/17/2024 Potassium 3.6.   History of Tricuspid Valve Regurgitation managed with Plavix  75 mg nightly. 2024 Echocardiogram showing LV EF 55-60%, mild concentric left ventricular hypertrophy, mild diastolic dysfunction. Moderately elevated pulmonary artery systolic pressure. Right atrial size mildly dilated. Mild mitral valve regurgitation. Tricuspid aortic valve w/ mild regurgitation.   History of Hyperlipidemia treated with Atorvastatin  80 mg daily. 03/17/2024 Lipid Panel: WNL.     History of Anxiety/Depression treated with Xanax  0.5 mg twice daily and Zoloft   100 mg daily. Insomnia treated with Remeron  7.5 - 15 mg at bedtime. Regarding her GAD7 Score: 14, she says that she has lately been upset about the state of her room as she feels like it is a mess, but does not have the energy to clean it thoroughly.   History of Onychomycosis, 1st Left Toe treated with Penlac  solution at bedtime. She saw Podiatrist, Dr. Loel on 5/12, and was prescribed Keflex  500 mg twice daily x7 days for cellulitis after cutting her toe while trying to trim her nails. Similar to when she was seen there, there is some redness around the nail today. Denies pain.   History of Severe Multilevel Lumbar Stenosis; Lumbar Radiculopathy. Due to her age, she is not a surgical candidate and has tried multiple interventions to relieve her back pain: In the past has tried, Meloxicam , Gabapentin , and epidural steroid injections w/o relief; at one point had a spinal stimulator placed that was disconnected due to associated abnormal vaginal sensations. Back Pain treated with Norco 5-325 mg as needed. Followed by Dr. Darlean, who she saw 7/02 for an intraarticular steroid injection, as well as PA-C Joshua Caruso, who she last saw 6/11. At that time there was discussion surrounding a [employer] PNS system, which she is considering but is hesitant to proceed with the time commitment in terms of follow-up and care of the system and the 60 days of treatment.   History of Bladder Dysfunction w/ Urinary Frequency s/p InterStim implant placement & removal.    Labs 03/17/2024 CBC w/ Differential, compared to 02/2023: Hemoglobin 11.1, decreased from 12; MCHC 31, decreased from 31; otherwise WNL Comprehensive Metabolic Panel: WNL TSH: 2.62   Mammogram 06/14/2023 normal with repeat recommendation of 2025.  Bone Density  03/08/2022  T-score at Femur Total Left was -2.9, osteoporotic with repeat recommendation of 2025.    Vaccine Counseling: Due for Influenza, Covid-19, Shingrix 1st dose, and Tdap - discussed,  agreeable to updating Influenza and Covid-19 and will consider Tdap and Shingrix series; UTD on PNA.  Review of Systems  Constitutional:  Negative for chills, fever, malaise/fatigue and weight loss.  HENT:  Negative for hearing loss, sinus pain and sore throat.   Respiratory:  Negative for cough, hemoptysis and shortness of breath.   Cardiovascular:  Negative for chest pain, palpitations, leg swelling and PND.  Gastrointestinal:  Positive for vomiting (once per pt's daughter). Negative for abdominal pain, constipation, diarrhea, heartburn and nausea.       (+) Decreased Appetite  Genitourinary:  Negative for dysuria, frequency and urgency.  Musculoskeletal:  Positive for falls (1 on 8/11, very mild, no injuries). Negative for back pain, myalgias and neck pain.  Skin:  Negative for itching and rash.  Neurological:  Negative for dizziness, tingling, seizures and headaches.  Endo/Heme/Allergies:  Negative for polydipsia.  Psychiatric/Behavioral:  Negative for depression. The patient is not nervous/anxious.   All other systems reviewed and are negative.  Objective:  Vitals: body mass index is 28.42 kg/m. Today's Vitals   03/21/24 1007  BP: 120/80  Pulse: 77  SpO2: 98%  Weight: 136 lb (61.7 kg)  Height: 4' 10 (1.473 m)  PainSc: 8   PainLoc: Back   Physical Exam Vitals and nursing note reviewed.  Constitutional:      General: She is not in acute distress.    Appearance: Normal appearance. She is not ill-appearing or toxic-appearing.  HENT:     Head: Normocephalic and atraumatic.     Right Ear: Hearing, tympanic membrane, ear canal and external ear normal.     Left Ear: Hearing, tympanic membrane, ear canal and external ear normal.     Mouth/Throat:     Pharynx: Oropharynx is clear.  Eyes:     Extraocular Movements: Extraocular movements intact.     Pupils: Pupils are equal, round, and reactive to light.  Neck:     Thyroid : No thyroid  mass, thyromegaly or thyroid  tenderness.      Vascular: No carotid bruit.  Cardiovascular:     Rate and Rhythm: Normal rate and regular rhythm. No extrasystoles are present.    Pulses:          Dorsalis pedis pulses are 2+ on the right side and 2+ on the left side.     Heart sounds: Normal heart sounds. No murmur heard.    No friction rub. No gallop.  Pulmonary:     Effort: Pulmonary effort is normal.     Breath sounds: Normal breath sounds. No decreased breath sounds, wheezing, rhonchi or rales.  Chest:     Chest wall: No mass.  Abdominal:     Palpations: Abdomen is soft. There is no hepatomegaly, splenomegaly or mass.     Tenderness: There is no abdominal tenderness.     Hernia: No hernia is present.  Musculoskeletal:     Cervical back: Normal range of motion.     Right lower leg: No edema.     Left lower leg: No edema.       Feet:  Feet:     Left foot:     Skin integrity: Erythema present.  Lymphadenopathy:     Cervical: No cervical adenopathy.     Upper Body:     Right upper body: No supraclavicular adenopathy.  Left upper body: No supraclavicular adenopathy.  Skin:    General: Skin is warm and dry.  Neurological:     General: No focal deficit present.     Mental Status: She is alert and oriented to person, place, and time. Mental status is at baseline.     Sensory: Sensation is intact.     Motor: Motor function is intact. No weakness.     Deep Tendon Reflexes: Reflexes are normal and symmetric.  Psychiatric:        Attention and Perception: Attention normal.        Mood and Affect: Mood normal.        Speech: Speech normal.        Behavior: Behavior normal.        Thought Content: Thought content normal.        Cognition and Memory: Cognition normal.        Judgment: Judgment normal.    Current Outpatient Medications  Medication Instructions   ALPRAZolam  (XANAX ) 0.5 MG tablet TAKE 1 TABLET BY MOUTH IN THE MORNING AND AT BEDTIME   atorvastatin  (LIPITOR) 80 MG tablet TAKE 1 TABLET(80 MG) BY MOUTH DAILY    cephALEXin  (KEFLEX ) 500 mg, Oral, 2 times daily   cholecalciferol (VITAMIN D) 1,000 Units, Daily   ciclopirox  (PENLAC ) 8 % solution Topical, Daily at bedtime, Apply thin layer over nail. Apply daily over previous coat. Remove weekly with polish remover.   clopidogrel  (PLAVIX ) 75 MG tablet TAKE 1 TABLET(75 MG) BY MOUTH DAILY   furosemide  (LASIX ) 40 mg, Oral, Daily PRN   HYDROcodone -acetaminophen  (NORCO/VICODIN) 5-325 MG tablet One tab by mouth every 8 hours as needed for pain.   meloxicam  (MOBIC ) 15 mg, Daily   Menthol, Topical Analgesic, (BIOFREEZE EX) 1 Application, Daily PRN   mirtazapine  (REMERON ) 15 MG tablet TAKE 1/2-1 TABLET BY MOUTH AT BEDTIME   Multiple Vitamins-Minerals (CENTRUM SILVER 50+WOMEN) TABS 1 tablet, Daily   potassium chloride  SA (KLOR-CON  M) 20 MEQ tablet 40 mEq, Oral, Daily   sertraline  (ZOLOFT ) 100 mg, Oral, Daily   TURMERIC PO 1 capsule, Daily   valsartan  (DIOVAN ) 80 MG tablet TAKE 1 TABLET(80 MG) BY MOUTH DAILY   Past Medical History:  Diagnosis Date   Allergy    Alzheimer disease (HCC)    Anxiety    Bladder dysfunction    Bulging lumbar disc    Depression    Hyperlipidemia    Hypertension    Low back pain    Lumbar radiculopathy    Spinal stenosis    Medical/Surgical History Narrative:  Allergic/Intolerant to:  Allergies  Allergen Reactions   Aspirin Nausea Only    Nausea, dizziness, malaise.   Codeine     Fatigue, GI Intolerance   Tramadol     fatigue   2021 - Covid-19 in January  2020 - counseling with Barnie Bunde, social worker for grief/anxiety  Past Surgical History:  Procedure Laterality Date   bladder dysfunction     bladder stimulator     BREAST CYST EXCISION     CATARACT EXTRACTION     INTERSTIM IMPLANT REMOVAL N/A 11/17/2022   Procedure: REMOVAL OF RENNA IMPLANT;  Surgeon: Elisabeth Valli BIRCH, MD;  Location: [facility];  Service: Urology;  Laterality: N/A;  60 MINUTES   ROTATOR CUFF REPAIR Right    WISDOM TOOTH EXTRACTION      Family History  Problem Relation Age of Onset   Arthritis Mother    Alcohol abuse Father    Anxiety disorder  Sister    Anxiety disorder Granddaughter    Anxiety disorder Niece    Family History Narrative: General Fhx of Alopecia Sister is 62 years old Brother unfortunately deceased 12/30/2024due to an MI, which he did have hx of Social History   Social History Narrative   01/06/22 lives with dgtr Leeroy  Widowed - husband deceased due to malignancy. She also has 1 son who lives in [place] .  Most Recent Health Risks Assessment:  Most Recent Social Determinants of Health (Including Hx of Tobacco, Alcohol, and Drug Use) SDOH Screenings   Food Insecurity: No Food Insecurity (03/21/2024)  Housing: Low Risk  (03/21/2024)  Transportation Needs: No Transportation Needs (03/21/2024)  Utilities: Not At Risk (03/01/2024)   Received from [facility]  Alcohol Screen: Low Risk  (09/13/2023)  Depression (PHQ2-9): High Risk (03/21/2024)  Financial Resource Strain: Low Risk  (03/21/2024)  Physical Activity: Inactive (03/21/2024)  Social Connections: Moderately Isolated (03/21/2024)  Stress: Stress Concern Present (03/21/2024)  Tobacco Use: Low Risk  (03/21/2024)  Health Literacy: Adequate Health Literacy (12/03/2023)   Social History   Tobacco Use   Smoking status: Never   Smokeless tobacco: Never  Vaping Use   Vaping status: Never Used  Substance Use Topics   Alcohol use: Not Currently    Comment: Reports 1/2 glass of wine periodically   Drug use: Never   Most Recent Functional Status Assessment:     No data to display         Most Recent Fall Risk Assessment:    03/08/2023    1:02 PM  Fall Risk   Falls in the past year? 0   Most Recent Anxiety/Depression Screenings:    03/21/2024   10:21 AM 03/08/2023   12:19 PM  PHQ 2/9 Scores  PHQ - 2 Score 6 0  PHQ- 9 Score 15       03/21/2024   10:21 AM  GAD 7 : Generalized Anxiety Score  Nervous, Anxious, on Edge 2   Control/stop worrying 3  Worry too much - different things 2  Trouble relaxing 2  Restless 1  Easily annoyed or irritable 2  Afraid - awful might happen 2  Total GAD 7 Score 14  Anxiety Difficulty Somewhat difficult   Most Recent Cognitive Screening:    03/05/2022   11:12 AM  6CIT Screen  What Year? 0 points  What month? 0 points  What time? 0 points  Count back from 20 0 points  Months in reverse 2 points  Repeat phrase 2 points  Total Score 4 points   Most Recent Vision/Hearing Screenings:No results found. Results:  Studies Obtained And Personally Reviewed By Me:  ECHOCARDIOGRAM IMPRESSIONS 11/ 11/ 2024 1. Left ventricular ejection fraction, by estimation, is 55 to 60% . The left ventricle has normal function. The left ventricle has no regional wall motion abnormalities. There is mild concentric left ventricular hypertrophy. Left ventricular diastolic parameters are consistent with Grade I diastolic dysfunction ( impaired relaxation) .   2. Right ventricular systolic function is normal. The right ventricular size is normal. There is moderately elevated pulmonary artery systolic pressure.   3. Right atrial size was mildly dilated.   4. The mitral valve is normal in structure. Trivial mitral valve regurgitation. No evidence of mitral stenosis.   5. The aortic valve is tricuspid. Aortic valve regurgitation is trivial. No aortic stenosis is present.   6. The inferior vena cava is normal in size with greater than 50% respiratory variability, suggesting right  atrial pressure of 3 mmHg.   Mammogram 06/14/2023 normal.  Bone Density 03/08/2022  T-score at Femur Total Left was -2.9, osteoporotic.    Labs:  CBC w/ Differential Lab Results  Component Value Date   WBC 3.8 03/17/2024   RBC 4.09 03/17/2024   HGB 11.1 (L) 03/17/2024   HCT 35.8 03/17/2024   PLT 184 03/17/2024   MCV 87.5 03/17/2024   MCH 27.1 03/17/2024   MCHC 31.0 (L) 03/17/2024   RDW 14.1 03/17/2024   MPV  11.4 03/17/2024   LYMPHSABS 1,257 03/04/2023   BASOSABS 38 03/17/2024    Comprehensive Metabolic Panel Lab Results  Component Value Date   NA 142 03/17/2024   K 3.6 03/17/2024   CL 106 03/17/2024   CO2 29 03/17/2024   GLUCOSE 83 03/17/2024   BUN 24 03/17/2024   CREATININE 0.91 03/17/2024   CALCIUM  9.8 03/17/2024   PROT 6.9 03/17/2024   ALBUMIN 4.0 03/16/2022   AST 16 03/17/2024   ALT 15 03/17/2024   ALKPHOS 88 03/16/2022   BILITOT 0.6 03/17/2024   EGFR 56 (L) 09/09/2023   GFRNONAA 57 (L) 11/10/2022   Lipid Panel  Lab Results  Component Value Date   CHOL 179 03/17/2024   HDL 77 03/17/2024   LDLCALC 87 03/17/2024   TRIG 64 03/17/2024   TSH Lab Results  Component Value Date   TSH 2.64 03/17/2024   Assessment & Plan:   Meds ordered this encounter  Medications   cephALEXin  (KEFLEX ) 500 MG capsule    Sig: Take 1 capsule (500 mg total) by mouth 2 (two) times daily.    Dispense:  10 capsule    Refill:  0  Other Labs Reviewed today: CBC w/ Differential, compared to 02/2023: Hemoglobin 11.1, decreased from 12; MCHC 31, decreased from 31; otherwise WNL Comprehensive Metabolic Panel: WNL TSH: 2.62   Hypertension treated with Valsartan  80 mg. Dependent Edema treated with Lasix  40 mg as needed. Blood Pressure: normotensive today at 120/80. Hypokalemia, Secondary to Diuretic Therapy, treated with Klor-Con  40 meq daily and 03/17/2024 Potassium 3.6.   Tricuspid Valve Regurgitation managed with Plavix  75 mg nightly. 2024 Echocardiogram showing LV EF 55-60%, mild concentric left ventricular hypertrophy, mild diastolic dysfunction. Moderately elevated pulmonary artery systolic pressure. Right atrial size mildly dilated. Mild mitral valve regurgitation. Tricuspid aortic valve w/ mild regurgitation.   Hyperlipidemia treated with Atorvastatin  80 mg daily. 03/17/2024 Lipid Panel: WNL.     Anxiety/Depression treated with Xanax  0.5 mg twice daily and Zoloft  100 mg daily.  Insomnia  treated with Remeron  7.5 - 15 mg at bedtime.  Discussed her GAD7 Score, and she says that she has lately been upset about the state of her room as she feels like it is a mess, but does not have the energy to clean it thoroughly.   Onychomycosis, 1st Left Toe treated with Penlac  solution at bedtime.  Cellulitis, 1st Left Toe: she saw Podiatrist, Dr. Loel on 5/12, and was prescribed Keflex  500 mg twice daily x7 days for cellulitis after cutting her toe while trying to trim her nails.  Similar to when she was seen there, erythema surrounding toenail, but patient denies passive pain or pain on palpation. Sending in Cephalexin  500 mg to take twice daily for 5 days.  Severe Multilevel Lumbar Stenosis; Lumbar Radiculopathy; Back Pain treated with Norco 5-325 mg as needed. Due to her age, she is not a surgical candidate and has tried multiple interventions to relieve her back pain. Followed by Dr. Darlean, who  she saw 7/02 for an intraarticular steroid injection, as well as PA-C Fonda Redbird, who she last saw 6/11. At that time there was discussion surrounding a [employer] PNS system, which she is considering but is hesitant to proceed with the time commitment in terms of follow-up and care of the system and the 60 days of treatment.   Bladder Dysfunction w/ Urinary Frequency s/p InterStim implant placement & removal.    Mammogram 06/14/2023 normal with repeat recommendation of 2025.  Bone Density 03/08/2022  T-score at Femur Total Left was -2.9, osteoporotic with repeat recommendation of 2025.    Vaccine Counseling: Due for Influenza, Covid-19, Shingrix 1st dose, and Tdap; UTD on PNA.  Discussed, agreeable to updating Influenza and Covid-19 and will consider Tdap and Shingrix series  Medicare Annual Wellness Screenings to be done 03/22/2024; otherwise Return in 6 months (on 09/25/2024) for 6 month follow-up, or as needed.   Annual Wellness Visit done today including the all of the following: Reviewed  patient's Family Medical History Reviewed patient's SDOH and reviewed tobacco, alcohol, and drug use.  Reviewed and updated list of patient's medical providers Assessment of cognitive impairment was done Assessed patient's functional ability Established a written schedule for health screening services Health Risk Assessent Completed and Reviewed  Discussed health benefits of physical activity, and encouraged her to engage in regular exercise appropriate for her age and condition.   I,Emily Lagle,acting as a Neurosurgeon for Ronal JINNY Hailstone, MD.,have documented all relevant documentation on the behalf of Ronal JINNY Hailstone, MD,as directed by  Ronal JINNY Hailstone, MD while in the presence of Ronal JINNY Hailstone, MD.   I have reviewed and agree with the above Annual Wellness Visit documentation.  Ronal Norleen Hailstone, MD. Internal Medicine 03/21/2024

## 2024-03-21 NOTE — Patient Instructions (Addendum)
 It was delightful seeing you today. Please continue current meds as prescribed. I have sent in a 5 day course of Keflex  as I suspect you have a an infected toe. Please return in 6 months or as needed.Vaccines discussed.

## 2024-03-22 ENCOUNTER — Encounter: Payer: Self-pay | Admitting: Internal Medicine

## 2024-03-22 ENCOUNTER — Ambulatory Visit: Payer: Medicare Other | Admitting: Internal Medicine

## 2024-03-22 VITALS — Ht <= 58 in | Wt 136.0 lb

## 2024-03-22 DIAGNOSIS — Z0000 Encounter for general adult medical examination without abnormal findings: Secondary | ICD-10-CM

## 2024-03-22 NOTE — Progress Notes (Signed)
 Subjective:   Melissa [name] is a 89 y.o. female who presents for Medicare Annual (Subsequent) preventive examination.  Visit Complete: Virtual I connected with  Melissa [name] on 03/22/24 by a audio enabled telemedicine application and verified that I am speaking with the correct person using two identifiers.  Patient Location: Home  Provider Location: Office/Clinic  I discussed the limitations of evaluation and management by telemedicine. The patient expressed understanding and agreed to proceed.  Vital Signs: Because this visit was a virtual/telehealth visit, some criteria may be missing or patient reported. Any vitals not documented were not able to be obtained and vitals that have been documented are patient reported.  Patient Medicare AWV questionnaire was completed by the patient on 03/22/2024; I have confirmed that all information answered by patient is correct and no changes since this date.  Cardiac Risk Factors include: advanced age (>67men, >65 women);dyslipidemia;hypertension     Objective:    Today's Vitals   03/22/24 0906 03/22/24 1241  Weight: 136 lb (61.7 kg)   Height: 4' 10 (1.473 m)   PainSc:  8    Body mass index is 28.42 kg/m.     11/10/2022    1:07 PM 03/05/2022   11:09 AM  Advanced Directives  Does Patient Have a Medical Advance Directive? Yes Yes  Type of Estate agent of [place];Living will Living will;Healthcare Power of Attorney  Does patient want to make changes to medical advance directive?  No - Patient declined  Copy of Healthcare Power of Attorney in Chart? No - copy requested No - copy requested    Current Medications (verified) Outpatient Encounter Medications as of 03/22/2024  Medication Sig   ALPRAZolam  (XANAX ) 0.5 MG tablet TAKE 1 TABLET BY MOUTH IN THE MORNING AND AT BEDTIME   atorvastatin  (LIPITOR) 80 MG tablet TAKE 1 TABLET(80 MG) BY MOUTH DAILY   cephALEXin  (KEFLEX ) 500 MG capsule Take 1 capsule (500 mg  total) by mouth 2 (two) times daily.   cholecalciferol (VITAMIN D) 1000 units tablet Take 1,000 Units by mouth daily.   ciclopirox  (PENLAC ) 8 % solution Apply topically at bedtime. Apply thin layer over nail. Apply daily over previous coat. Remove weekly with polish remover.   clopidogrel  (PLAVIX ) 75 MG tablet TAKE 1 TABLET(75 MG) BY MOUTH DAILY   furosemide  (LASIX ) 40 MG tablet Take 1 tablet (40 mg total) by mouth daily as needed.   HYDROcodone -acetaminophen  (NORCO/VICODIN) 5-325 MG tablet One tab by mouth every 8 hours as needed for pain.   meloxicam  (MOBIC ) 15 MG tablet Take 15 mg by mouth daily.   Menthol, Topical Analgesic, (BIOFREEZE EX) Apply 1 Application topically daily as needed (pain).   mirtazapine  (REMERON ) 15 MG tablet TAKE 1/2-1 TABLET BY MOUTH AT BEDTIME   Multiple Vitamins-Minerals (CENTRUM SILVER 50+WOMEN) TABS Take 1 tablet by mouth daily.   potassium chloride  SA (KLOR-CON  M) 20 MEQ tablet Take 2 tablets (40 mEq total) by mouth daily.   sertraline  (ZOLOFT ) 100 MG tablet Take 1 tablet (100 mg total) by mouth daily.   TURMERIC PO Take 1 capsule by mouth daily.   valsartan  (DIOVAN ) 80 MG tablet TAKE 1 TABLET(80 MG) BY MOUTH DAILY   Facility-Administered Encounter Medications as of 03/22/2024  Medication   denosumab  (PROLIA ) injection 60 mg    Allergies (verified) Aspirin, Codeine, and Tramadol   History: Past Medical History:  Diagnosis Date   Allergy    Alzheimer disease (HCC)    Anxiety    Arthritis    Bladder  dysfunction    Bulging lumbar disc    Depression    Hyperlipidemia    Hypertension    Low back pain    Lumbar radiculopathy    Spinal stenosis    Past Surgical History:  Procedure Laterality Date   bladder dysfunction     bladder stimulator     BREAST CYST EXCISION     CATARACT EXTRACTION     INTERSTIM IMPLANT REMOVAL N/A 11/17/2022   Procedure: REMOVAL OF INTERSTIM IMPLANT;  Surgeon: Elisabeth Valli BIRCH, MD;  Location: [facility];  Service: Urology;   Laterality: N/A;  60 MINUTES   ROTATOR CUFF REPAIR Right    WISDOM TOOTH EXTRACTION     Family History  Problem Relation Age of Onset   Arthritis Mother    Diabetes Mother    Alcohol abuse Father    Anxiety disorder Sister    Anxiety disorder Granddaughter    Anxiety disorder Niece    Social History   Socioeconomic History   Marital status: Widowed    Spouse name: Not on file   Number of children: 2   Years of education: Not on file   Highest education level: Bachelor's degree (e.g., BA, AB, BS)  Occupational History   Not on file  Tobacco Use   Smoking status: Never   Smokeless tobacco: Never  Vaping Use   Vaping status: Never Used  Substance and Sexual Activity   Alcohol use: Not Currently    Comment: Reports 1/2 glass of wine periodically   Drug use: Never   Sexual activity: Not Currently    Birth control/protection: None  Other Topics Concern   Not on file  Social History Narrative   01/06/22 lives with dgtr Melissa [name]   Social Drivers of Health   Financial Resource Strain: Low Risk  (03/22/2024)   Overall Financial Resource Strain (CARDIA)    Difficulty of Paying Living Expenses: Not hard at all  Food Insecurity: No Food Insecurity (03/22/2024)   Hunger Vital Sign    Worried About Running Out of Food in the Last Year: Never true    Ran Out of Food in the Last Year: Never true  Transportation Needs: No Transportation Needs (03/21/2024)   PRAPARE - Administrator, Civil Service (Medical): No    Lack of Transportation (Non-Medical): No  Physical Activity: Inactive (03/22/2024)   Exercise Vital Sign    Days of Exercise per Week: 0 days    Minutes of Exercise per Session: 10 min  Stress: Stress Concern Present (03/22/2024)   [employer] of Occupational Health - Occupational Stress Questionnaire    Feeling of Stress: Very much  Social Connections: Moderately Integrated (03/22/2024)   Social Connection and Isolation Panel    Frequency of Communication  with Friends and Family: More than three times a week    Frequency of Social Gatherings with Friends and Family: Patient declined    Attends Religious Services: 1 to 4 times per year    Active Member of [employer] or Organizations: No    Attends Banker Meetings: 1 to 4 times per year    Marital Status: Widowed  Recent Concern: Social Connections - Moderately Isolated (03/21/2024)   Social Connection and Isolation Panel    Frequency of Communication with Friends and Family: More than three times a week    Frequency of Social Gatherings with Friends and Family: Patient declined    Attends Religious Services: 1 to 4 times per year    Active Member  of Clubs or Organizations: No    Attends Banker Meetings: Not on file    Marital Status: Widowed    Tobacco Counseling Counseling given: No   Clinical Intake:  Pre-visit preparation completed: Yes  Pain : 0-10 Pain Score: 8  Pain Type: Chronic pain Pain Descriptors / Indicators: Constant Pain Onset: More than a month ago Pain Frequency: Intermittent     BMI - recorded: 28.42 Nutritional Status: BMI 25 -29 Overweight Nutritional Risks: None Diabetes: No  How often do you need to have someone help you when you read instructions, pamphlets, or other written materials from your doctor or pharmacy?: 1 - Never  Interpreter Needed?: No  Information entered by :: Kathlynn Porto, CMA   Activities of Daily Living    03/22/2024    9:17 AM 03/22/2024    9:11 AM  In your present state of health, do you have any difficulty performing the following activities:  Hearing? 0 0  Vision? 0 0  Difficulty concentrating or making decisions? 1 1  Walking or climbing stairs? 1 1  Dressing or bathing? 0 0  Doing errands, shopping? 1 1  Preparing Food and eating ? N N  Using the Toilet? N N  In the past six months, have you accidently leaked urine? Y Y  Do you have problems with loss of bowel control? N N  Managing your  Medications? N N  Managing your Finances? Y Y  Housekeeping or managing your Housekeeping? CINDERELLA CINDERELLA    Patient Care Team: Perri Ronal PARAS, MD as PCP - General (Internal Medicine) Santo Stanly LABOR, MD as PCP - Cardiology (Cardiology) Baxley, Ronal PARAS, MD (Internal Medicine) Santo Stanly LABOR, MD as Consulting Physician (Cardiology)  Indicate any recent Medical Services you may have received from other than [facility] providers in the past year (date may be approximate).     Assessment:   This is a routine wellness examination for Kazi.   Goals Addressed   None    Depression Screen    03/22/2024    9:08 AM 03/21/2024   10:21 AM 03/08/2023   12:19 PM 10/02/2022   11:16 AM 08/27/2022    4:02 PM 08/27/2022    3:54 PM 03/05/2022   11:10 AM  PHQ 2/9 Scores  PHQ - 2 Score 6 6 0 0 6 1 6   PHQ- 9 Score 15 15   16  6     Fall Risk    03/22/2024    9:18 AM 03/22/2024    9:12 AM 03/22/2024    9:11 AM 03/08/2023    1:02 PM 10/02/2022   11:15 AM  Fall Risk   Falls in the past year? 1 1 1  0 0  Number falls in past yr: 1 1 1   0  Injury with Fall? 0 0 0  0  Risk for fall due to : Impaired balance/gait    Impaired balance/gait  Follow up Falls evaluation completed;Education provided;Falls prevention discussed    Falls prevention discussed    MEDICARE RISK AT HOME: Medicare Risk at Home Any stairs in or around the home?: Yes If so, are there any without handrails?: No Home free of loose throw rugs in walkways, pet beds, electrical cords, etc?: Yes Adequate lighting in your home to reduce risk of falls?: Yes Life alert?: No Use of a cane, walker or w/c?: Yes Grab bars in the bathroom?: Yes Shower chair or bench in shower?: Yes Elevated toilet seat or a handicapped toilet?: No  TIMED UP AND GO:  Was the test performed?  No    Cognitive Function:    01/06/2022   11:30 AM  MMSE - Mini Mental State Exam  Orientation to time 5  Orientation to Place 4  Registration 3  Attention/  Calculation 0  Recall 2  Language- name 2 objects 2  Language- repeat 0  Language- follow 3 step command 3  Language- read & follow direction 1  Write a sentence 1  Copy design 0  Total score 21        03/05/2022   11:12 AM  6CIT Screen  What Year? 0 points  What month? 0 points  What time? 0 points  Count back from 20 0 points  Months in reverse 2 points  Repeat phrase 2 points  Total Score 4 points    Immunizations Immunization History  Administered Date(s) Administered   Fluad Quad(high Dose 65+) 05/12/2022   Hep A / Hep B 01/28/1996   Influenza, High Dose Seasonal PF 06/14/2015, 04/16/2017, 06/09/2019   Influenza,inj,Quad PF,6+ Mos 04/20/2008, 04/08/2009, 05/08/2011, 05/15/2014, 05/13/2016, 04/16/2017   Influenza-Unspecified 05/24/2020, 06/16/2021   PFIZER Comirnaty(Gray Top)Covid-19 Tri-Sucrose Vaccine 09/21/2019, 10/16/2019, 05/24/2020, 11/27/2020, 05/12/2022   PFIZER(Purple Top)SARS-COV-2 Vaccination 09/21/2019, 10/16/2019, 05/24/2020, 11/27/2020   Pfizer Covid-19 Vaccine Bivalent Booster 51yrs & up 08/08/2021   Pneumococcal Conjugate-13 02/25/2018, 03/14/2018   Pneumococcal Polysaccharide-23 02/27/2019   Smallpox 08/31/1967   Td 01/25/1995   Typhoid Parenteral 01/26/1961   Typhoid Parenteral, AKD ([place]  [employer]) 01/26/1961   Yellow Fever 02/01/1995   Zoster, Live 05/15/2014    TDAP status: Due, Education has been provided regarding the importance of this vaccine. Advised may receive this vaccine at local pharmacy or Health Dept. Aware to provide a copy of the vaccination record if obtained from local pharmacy or Health Dept. Verbalized acceptance and understanding.  Flu Vaccine status: Due, Education has been provided regarding the importance of this vaccine. Advised may receive this vaccine at local pharmacy or Health Dept. Aware to provide a copy of the vaccination record if obtained from local pharmacy or Health Dept. Verbalized acceptance and  understanding.  Pneumococcal vaccine status: Up to date  Covid-19 vaccine status: Information provided on how to obtain vaccines.   Qualifies for Shingles Vaccine? Yes   Zostavax completed Yes   Shingrix Completed?: No.    Education has been provided regarding the importance of this vaccine. Patient has been advised to call insurance company to determine out of pocket expense if they have not yet received this vaccine. Advised may also receive vaccine at local pharmacy or Health Dept. Verbalized acceptance and understanding.  Screening Tests Health Maintenance  Topic Date Due   Zoster Vaccines- Shingrix (1 of 2) 06/23/1950   COVID-19 Vaccine (11 - 2024-25 season) 04/11/2023   INFLUENZA VACCINE  03/10/2024   DTaP/Tdap/Td (2 - Tdap) 03/21/2025 (Originally 01/24/2005)   MAMMOGRAM  06/13/2024   Medicare Annual Wellness (AWV)  03/22/2025   Pneumococcal Vaccine: 50+ Years  Completed   DEXA SCAN  Completed   HPV VACCINES  Aged Out   Meningococcal B Vaccine  Aged Out   Hepatitis B Vaccines  Discontinued    Health Maintenance  Health Maintenance Due  Topic Date Due   Zoster Vaccines- Shingrix (1 of 2) 06/23/1950   COVID-19 Vaccine (11 - 2024-25 season) 04/11/2023   INFLUENZA VACCINE  03/10/2024    Colorectal cancer screening: No longer required.   Mammogram status: Completed 06/14/2023. Repeat every year  Bone Density status: Completed 09/08/22.  Results reflect: Bone density results: OSTEOPOROSIS. Repeat every 2 years.  Lung Cancer Screening: (Low Dose CT Chest recommended if Age 31-80 years, 20 pack-year currently smoking OR have quit w/in 15years.) does not qualify.   Additional Screening:  Hepatitis C Screening: does not qualify; Completed   Vision Screening: Recommended annual ophthalmology exams for early detection of glaucoma and other disorders of the eye. Is the patient up to date with their annual eye exam?  Yes  Who is the provider or what is the name of the office in  which the patient attends annual eye exams? [facility]  If pt is not established with a provider, would they like to be referred to a provider to establish care? Yes .   Dental Screening: Recommended annual dental exams for proper oral hygiene  Community Resource Referral / Chronic Care Management: CRR required this visit?  No   CCM required this visit?  No     Plan:     I have personally reviewed and noted the following in the patient's chart:   Medical and social history Use of alcohol, tobacco or illicit drugs  Current medications and supplements including opioid prescriptions. Patient is currently taking opioid prescriptions. Information provided to patient regarding non-opioid alternatives. Patient advised to discuss non-opioid treatment plan with their provider. Functional ability and status Nutritional status Physical activity Advanced directives List of other physicians Hospitalizations, surgeries, and ER visits in previous 12 months Vitals Screenings to include cognitive, depression, and falls Referrals and appointments  In addition, I have reviewed and discussed with patient certain preventive protocols, quality metrics, and best practice recommendations. A written personalized care plan for preventive services as well as general preventive health recommendations were provided to patient.     Fritzie Prioleau Zelda, CMA   03/22/2024   After Visit Summary: (Mail) Due to this being a telephonic visit, the after visit summary with patients personalized plan was offered to patient via mail

## 2024-03-22 NOTE — Addendum Note (Signed)
 Addended by: PERRI RONAL PARAS on: 03/22/2024 11:20 AM   Modules accepted: Level of Service

## 2024-03-22 NOTE — Progress Notes (Deleted)
 Subjective:   Melissa [name] is a 89 y.o. female who presents for Medicare Annual (Subsequent) preventive examination.  Visit Complete: In person  Patient Medicare AWV questionnaire was completed by the patient on 03/22/2024; I have confirmed that all information answered by patient is correct and no changes since this date.  Cardiac Risk Factors include: advanced age (>83men, >28 women);dyslipidemia;hypertension     Objective:    Today's Vitals   03/22/24 0906  Weight: 136 lb (61.7 kg)  Height: 4' 10 (1.473 m)   Body mass index is 28.42 kg/m.     11/10/2022    1:07 PM 03/05/2022   11:09 AM  Advanced Directives  Does Patient Have a Medical Advance Directive? Yes Yes  Type of Estate agent of [place];Living will Living will;Healthcare Power of Attorney  Does patient want to make changes to medical advance directive?  No - Patient declined  Copy of Healthcare Power of Attorney in Chart? No - copy requested No - copy requested    Current Medications (verified) Outpatient Encounter Medications as of 03/22/2024  Medication Sig   ALPRAZolam  (XANAX ) 0.5 MG tablet TAKE 1 TABLET BY MOUTH IN THE MORNING AND AT BEDTIME   atorvastatin  (LIPITOR) 80 MG tablet TAKE 1 TABLET(80 MG) BY MOUTH DAILY   cephALEXin  (KEFLEX ) 500 MG capsule Take 1 capsule (500 mg total) by mouth 2 (two) times daily.   cholecalciferol (VITAMIN D) 1000 units tablet Take 1,000 Units by mouth daily.   ciclopirox  (PENLAC ) 8 % solution Apply topically at bedtime. Apply thin layer over nail. Apply daily over previous coat. Remove weekly with polish remover.   clopidogrel  (PLAVIX ) 75 MG tablet TAKE 1 TABLET(75 MG) BY MOUTH DAILY   furosemide  (LASIX ) 40 MG tablet Take 1 tablet (40 mg total) by mouth daily as needed.   HYDROcodone -acetaminophen  (NORCO/VICODIN) 5-325 MG tablet One tab by mouth every 8 hours as needed for pain.   meloxicam  (MOBIC ) 15 MG tablet Take 15 mg by mouth daily.   Menthol,  Topical Analgesic, (BIOFREEZE EX) Apply 1 Application topically daily as needed (pain).   mirtazapine  (REMERON ) 15 MG tablet TAKE 1/2-1 TABLET BY MOUTH AT BEDTIME   Multiple Vitamins-Minerals (CENTRUM SILVER 50+WOMEN) TABS Take 1 tablet by mouth daily.   potassium chloride  SA (KLOR-CON  M) 20 MEQ tablet Take 2 tablets (40 mEq total) by mouth daily.   sertraline  (ZOLOFT ) 100 MG tablet Take 1 tablet (100 mg total) by mouth daily.   TURMERIC PO Take 1 capsule by mouth daily.   valsartan  (DIOVAN ) 80 MG tablet TAKE 1 TABLET(80 MG) BY MOUTH DAILY   Facility-Administered Encounter Medications as of 03/22/2024  Medication   denosumab  (PROLIA ) injection 60 mg    Allergies (verified) Aspirin, Codeine, and Tramadol   History: Past Medical History:  Diagnosis Date   Allergy    Alzheimer disease (HCC)    Anxiety    Arthritis    Bladder dysfunction    Bulging lumbar disc    Depression    Hyperlipidemia    Hypertension    Low back pain    Lumbar radiculopathy    Spinal stenosis    Past Surgical History:  Procedure Laterality Date   bladder dysfunction     bladder stimulator     BREAST CYST EXCISION     CATARACT EXTRACTION     INTERSTIM IMPLANT REMOVAL N/A 11/17/2022   Procedure: REMOVAL OF INTERSTIM IMPLANT;  Surgeon: Elisabeth Valli BIRCH, MD;  Location: [facility];  Service: Urology;  Laterality:  N/A;  60 MINUTES   ROTATOR CUFF REPAIR Right    WISDOM TOOTH EXTRACTION     Family History  Problem Relation Age of Onset   Arthritis Mother    Diabetes Mother    Alcohol abuse Father    Anxiety disorder Sister    Anxiety disorder Granddaughter    Anxiety disorder Niece    Social History   Socioeconomic History   Marital status: Widowed    Spouse name: Not on file   Number of children: 2   Years of education: Not on file   Highest education level: Bachelor's degree (e.g., BA, AB, BS)  Occupational History   Not on file  Tobacco Use   Smoking status: Never   Smokeless tobacco: Never   Vaping Use   Vaping status: Never Used  Substance and Sexual Activity   Alcohol use: Not Currently    Comment: Reports 1/2 glass of wine periodically   Drug use: Never   Sexual activity: Not Currently    Birth control/protection: None  Other Topics Concern   Not on file  Social History Narrative   01/06/22 lives with dgtr Leeroy   Social Drivers of Health   Financial Resource Strain: Low Risk  (03/22/2024)   Overall Financial Resource Strain (CARDIA)    Difficulty of Paying Living Expenses: Not hard at all  Food Insecurity: No Food Insecurity (03/22/2024)   Hunger Vital Sign    Worried About Running Out of Food in the Last Year: Never true    Ran Out of Food in the Last Year: Never true  Transportation Needs: No Transportation Needs (03/21/2024)   PRAPARE - Administrator, Civil Service (Medical): No    Lack of Transportation (Non-Medical): No  Physical Activity: Inactive (03/22/2024)   Exercise Vital Sign    Days of Exercise per Week: 0 days    Minutes of Exercise per Session: 10 min  Stress: Stress Concern Present (03/22/2024)   [employer] of Occupational Health - Occupational Stress Questionnaire    Feeling of Stress: Very much  Social Connections: Moderately Integrated (03/22/2024)   Social Connection and Isolation Panel    Frequency of Communication with Friends and Family: More than three times a week    Frequency of Social Gatherings with Friends and Family: Patient declined    Attends Religious Services: 1 to 4 times per year    Active Member of [employer] or Organizations: No    Attends Banker Meetings: 1 to 4 times per year    Marital Status: Widowed  Recent Concern: Social Connections - Moderately Isolated (03/21/2024)   Social Connection and Isolation Panel    Frequency of Communication with Friends and Family: More than three times a week    Frequency of Social Gatherings with Friends and Family: Patient declined    Attends Religious  Services: 1 to 4 times per year    Active Member of [employer] or Organizations: No    Attends Banker Meetings: Not on file    Marital Status: Widowed    Tobacco Counseling Counseling given: No   Clinical Intake:                        Activities of Daily Living    03/22/2024    9:17 AM 03/22/2024    9:11 AM  In your present state of health, do you have any difficulty performing the following activities:  Hearing? 0 0  Vision? 0  0  Difficulty concentrating or making decisions? 1 1  Walking or climbing stairs? 1 1  Dressing or bathing? 0 0  Doing errands, shopping? 1 1  Preparing Food and eating ? N N  Using the Toilet? N N  In the past six months, have you accidently leaked urine? Y Y  Do you have problems with loss of bowel control? N N  Managing your Medications? N N  Managing your Finances? Y Y  Housekeeping or managing your Housekeeping? CINDERELLA CINDERELLA    Patient Care Team: Perri Ronal PARAS, MD as PCP - General (Internal Medicine) Santo Stanly LABOR, MD as PCP - Cardiology (Cardiology) Baxley, Ronal PARAS, MD (Internal Medicine) Santo Stanly LABOR, MD as Consulting Physician (Cardiology)  Indicate any recent Medical Services you may have received from other than [facility] providers in the past year (date may be approximate).     Assessment:   This is a routine wellness examination for Melissa [name].  Hearing/Vision screen No results found.   Goals Addressed   None    Depression Screen    03/22/2024    9:08 AM 03/21/2024   10:21 AM 03/08/2023   12:19 PM 10/02/2022   11:16 AM 08/27/2022    4:02 PM 08/27/2022    3:54 PM 03/05/2022   11:10 AM  PHQ 2/9 Scores  PHQ - 2 Score 6 6 0 0 6 1 6   PHQ- 9 Score 15 15   16  6     Fall Risk    03/22/2024    9:18 AM 03/22/2024    9:12 AM 03/22/2024    9:11 AM 03/08/2023    1:02 PM 10/02/2022   11:15 AM  Fall Risk   Falls in the past year? 1 1 1  0 0  Number falls in past yr: 1 1 1   0  Injury with Fall? 0 0 0  0   Risk for fall due to : Impaired balance/gait    Impaired balance/gait  Follow up Falls evaluation completed;Education provided;Falls prevention discussed    Falls prevention discussed    MEDICARE RISK AT HOME: Medicare Risk at Home Any stairs in or around the home?: Yes If so, are there any without handrails?: No Home free of loose throw rugs in walkways, pet beds, electrical cords, etc?: Yes Adequate lighting in your home to reduce risk of falls?: Yes Life alert?: No Use of a cane, walker or w/c?: Yes Grab bars in the bathroom?: Yes Shower chair or bench in shower?: Yes Elevated toilet seat or a handicapped toilet?: No  TIMED UP AND GO:  Was the test performed?  No    Cognitive Function:    01/06/2022   11:30 AM  MMSE - Mini Mental State Exam  Orientation to time 5  Orientation to Place 4  Registration 3  Attention/ Calculation 0  Recall 2  Language- name 2 objects 2  Language- repeat 0  Language- follow 3 step command 3  Language- read & follow direction 1  Write a sentence 1  Copy design 0  Total score 21        03/05/2022   11:12 AM  6CIT Screen  What Year? 0 points  What month? 0 points  What time? 0 points  Count back from 20 0 points  Months in reverse 2 points  Repeat phrase 2 points  Total Score 4 points    Immunizations Immunization History  Administered Date(s) Administered   Fluad Quad(high Dose 65+) 05/12/2022   Hep A /  Hep B 01/28/1996   Influenza, High Dose Seasonal PF 06/14/2015, 04/16/2017, 06/09/2019   Influenza,inj,Quad PF,6+ Mos 04/20/2008, 04/08/2009, 05/08/2011, 05/15/2014, 05/13/2016, 04/16/2017   Influenza-Unspecified 05/24/2020, 06/16/2021   PFIZER Comirnaty(Gray Top)Covid-19 Tri-Sucrose Vaccine 09/21/2019, 10/16/2019, 05/24/2020, 11/27/2020, 05/12/2022   PFIZER(Purple Top)SARS-COV-2 Vaccination 09/21/2019, 10/16/2019, 05/24/2020, 11/27/2020   Pfizer Covid-19 Vaccine Bivalent Booster 58yrs & up 08/08/2021   Pneumococcal  Conjugate-13 02/25/2018, 03/14/2018   Pneumococcal Polysaccharide-23 02/27/2019   Smallpox 08/31/1967   Td 01/25/1995   Typhoid Parenteral 01/26/1961   Typhoid Parenteral, AKD ([place]  [employer]) 01/26/1961   Yellow Fever 02/01/1995   Zoster, Live 05/15/2014    TDAP status: Due, Education has been provided regarding the importance of this vaccine. Advised may receive this vaccine at local pharmacy or Health Dept. Aware to provide a copy of the vaccination record if obtained from local pharmacy or Health Dept. Verbalized acceptance and understanding.  Flu Vaccine status: Due, Education has been provided regarding the importance of this vaccine. Advised may receive this vaccine at local pharmacy or Health Dept. Aware to provide a copy of the vaccination record if obtained from local pharmacy or Health Dept. Verbalized acceptance and understanding.  Pneumococcal vaccine status: Up to date  Covid-19 vaccine status: Information provided on how to obtain vaccines.   Qualifies for Shingles Vaccine? Yes   Zostavax completed Yes   Shingrix Completed?: No.    Education has been provided regarding the importance of this vaccine. Patient has been advised to call insurance company to determine out of pocket expense if they have not yet received this vaccine. Advised may also receive vaccine at local pharmacy or Health Dept. Verbalized acceptance and understanding.  Screening Tests Health Maintenance  Topic Date Due   Zoster Vaccines- Shingrix (1 of 2) 06/23/1950   COVID-19 Vaccine (11 - 2024-25 season) 04/11/2023   INFLUENZA VACCINE  03/10/2024   DTaP/Tdap/Td (2 - Tdap) 03/21/2025 (Originally 01/24/2005)   MAMMOGRAM  06/13/2024   Medicare Annual Wellness (AWV)  03/21/2025   Pneumococcal Vaccine: 50+ Years  Completed   DEXA SCAN  Completed   HPV VACCINES  Aged Out   Meningococcal B Vaccine  Aged Out   Hepatitis B Vaccines  Discontinued    Health Maintenance  Health Maintenance Due  Topic Date Due    Zoster Vaccines- Shingrix (1 of 2) 06/23/1950   COVID-19 Vaccine (11 - 2024-25 season) 04/11/2023   INFLUENZA VACCINE  03/10/2024    Colorectal cancer screening: No longer required.   Mammogram status: Completed 06/13/2024. Repeat every year  Bone Density status: Completed 09/08/2022. Results reflect: Bone density results: OSTEOPOROSIS. Repeat every 2 years.  Lung Cancer Screening: (Low Dose CT Chest recommended if Age 70-80 years, 20 pack-year currently smoking OR have quit w/in 15years.) does not qualify.    Additional Screening:  Hepatitis C Screening: does not qualify; Completed   Vision Screening: Recommended annual ophthalmology exams for early detection of glaucoma and other disorders of the eye. Is the patient up to date with their annual eye exam?  Yes  Who is the provider or what is the name of the office in which the patient attends annual eye exams? [facility]  If pt is not established with a provider, would they like to be referred to a provider to establish care? No .   Dental Screening: Recommended annual dental exams for proper oral hygiene  Community Resource Referral / Chronic Care Management: CRR required this visit?  No   CCM required this visit?  No  Plan:     I have personally reviewed and noted the following in the patient's chart:   Medical and social history Use of alcohol, tobacco or illicit drugs  Current medications and supplements including opioid prescriptions. Patient is not currently taking opioid prescriptions. Functional ability and status Nutritional status Physical activity Advanced directives List of other physicians Hospitalizations, surgeries, and ER visits in previous 12 months Vitals Screenings to include cognitive, depression, and falls Referrals and appointments  In addition, I have reviewed and discussed with patient certain preventive protocols, quality metrics, and best practice recommendations. A written  personalized care plan for preventive services as well as general preventive health recommendations were provided to patient.     Braeden Dolinski Zelda, CMA   03/22/2024   After Visit Summary: (Mail) Due to this being a telephonic visit, the after visit summary with patients personalized plan was offered to patient via mail

## 2024-03-22 NOTE — Addendum Note (Signed)
 Addended by: PERRI RONAL PARAS on: 03/22/2024 11:22 AM   Modules accepted: Level of Service

## 2024-03-22 NOTE — Patient Instructions (Signed)
 Next appointment: Follow up in one year for your annual wellness visit    Preventive Care 65 Years and Older, Female Preventive care refers to lifestyle choices and visits with your health care provider that can promote health and wellness. What does preventive care include? A yearly physical exam. This is also called an annual well check. Dental exams once or twice a year. Routine eye exams. Ask your health care provider how often you should have your eyes checked. Personal lifestyle choices, including: Daily care of your teeth and gums. Regular physical activity. Eating a healthy diet. Avoiding tobacco and drug use. Limiting alcohol use. Practicing safe sex. Taking low-dose aspirin every day. Taking vitamin and mineral supplements as recommended by your health care provider. What happens during an annual well check? The services and screenings done by your health care provider during your annual well check will depend on your age, overall health, lifestyle risk factors, and family history of disease. Counseling  Your health care provider may ask you questions about your: Alcohol use. Tobacco use. Drug use. Emotional well-being. Home and relationship well-being. Sexual activity. Eating habits. History of falls. Memory and ability to understand (cognition). Work and work Astronomer. Reproductive health. Screening  You may have the following tests or measurements: Height, weight, and BMI. Blood pressure. Lipid and cholesterol levels. These may be checked every 5 years, or more frequently if you are over 88 years old. Skin check. Lung cancer screening. You may have this screening every year starting at age 88 if you have a 30-pack-year history of smoking and currently smoke or have quit within the past 15 years. Fecal occult blood test (FOBT) of the stool. You may have this test every year starting at age 88. Flexible sigmoidoscopy or colonoscopy. You may have a  sigmoidoscopy every 5 years or a colonoscopy every 10 years starting at age 88. Hepatitis C blood test. Hepatitis B blood test. Sexually transmitted disease (STD) testing. Diabetes screening. This is done by checking your blood sugar (glucose) after you have not eaten for a while (fasting). You may have this done every 1-3 years. Bone density scan. This is done to screen for osteoporosis. You may have this done starting at age 88. Mammogram. This may be done every 1-2 years. Talk to your health care provider about how often you should have regular mammograms. Talk with your health care provider about your test results, treatment options, and if necessary, the need for more tests. Vaccines  Your health care provider may recommend certain vaccines, such as: Influenza vaccine. This is recommended every year. Tetanus, diphtheria, and acellular pertussis (Tdap, Td) vaccine. You may need a Td booster every 10 years. Zoster vaccine. You may need this after age 88. Pneumococcal 13-valent conjugate (PCV13) vaccine. One dose is recommended after age 18. Pneumococcal polysaccharide (PPSV23) vaccine. One dose is recommended after age 25. Talk to your health care provider about which screenings and vaccines you need and how often you need them. This information is not intended to replace advice given to you by your health care provider. Make sure you discuss any questions you have with your health care provider. Document Released: 08/23/2015 Document Revised: 04/15/2016 Document Reviewed: 05/28/2015 Elsevier Interactive Patient Education  2017 [employer].  Fall Prevention in the Home Falls can cause injuries. They can happen to people of all ages. There are many things you can do to make your home safe and to help prevent falls. What can I do on the outside of  my home? Regularly fix the edges of walkways and driveways and fix any cracks. Remove anything that might make you trip as you walk through a  door, such as a raised step or threshold. Trim any bushes or trees on the path to your home. Use bright outdoor lighting. Clear any walking paths of anything that might make someone trip, such as rocks or tools. Regularly check to see if handrails are loose or broken. Make sure that both sides of any steps have handrails. Any raised decks and porches should have guardrails on the edges. Have any leaves, snow, or ice cleared regularly. Use sand or salt on walking paths during winter. Clean up any spills in your garage right away. This includes oil or grease spills. What can I do in the bathroom? Use night lights. Install grab bars by the toilet and in the tub and shower. Do not use towel bars as grab bars. Use non-skid mats or decals in the tub or shower. If you need to sit down in the shower, use a plastic, non-slip stool. Keep the floor dry. Clean up any water that spills on the floor as soon as it happens. Remove soap buildup in the tub or shower regularly. Attach bath mats securely with double-sided non-slip rug tape. Do not have throw rugs and other things on the floor that can make you trip. What can I do in the bedroom? Use night lights. Make sure that you have a light by your bed that is easy to reach. Do not use any sheets or blankets that are too big for your bed. They should not hang down onto the floor. Have a firm chair that has side arms. You can use this for support while you get dressed. Do not have throw rugs and other things on the floor that can make you trip. What can I do in the kitchen? Clean up any spills right away. Avoid walking on wet floors. Keep items that you use a lot in easy-to-reach places. If you need to reach something above you, use a strong step stool that has a grab bar. Keep electrical cords out of the way. Do not use floor polish or wax that makes floors slippery. If you must use wax, use non-skid floor wax. Do not have throw rugs and other things  on the floor that can make you trip. What can I do with my stairs? Do not leave any items on the stairs. Make sure that there are handrails on both sides of the stairs and use them. Fix handrails that are broken or loose. Make sure that handrails are as long as the stairways. Check any carpeting to make sure that it is firmly attached to the stairs. Fix any carpet that is loose or worn. Avoid having throw rugs at the top or bottom of the stairs. If you do have throw rugs, attach them to the floor with carpet tape. Make sure that you have a light switch at the top of the stairs and the bottom of the stairs. If you do not have them, ask someone to add them for you. What else can I do to help prevent falls? Wear shoes that: Do not have high heels. Have rubber bottoms. Are comfortable and fit you well. Are closed at the toe. Do not wear sandals. If you use a stepladder: Make sure that it is fully opened. Do not climb a closed stepladder. Make sure that both sides of the stepladder are locked into place. Ask  someone to hold it for you, if possible. Clearly mark and make sure that you can see: Any grab bars or handrails. First and last steps. Where the edge of each step is. Use tools that help you move around (mobility aids) if they are needed. These include: Canes. Walkers. Scooters. Crutches. Turn on the lights when you go into a dark area. Replace any light bulbs as soon as they burn out. Set up your furniture so you have a clear path. Avoid moving your furniture around. If any of your floors are uneven, fix them. If there are any pets around you, be aware of where they are. Review your medicines with your doctor. Some medicines can make you feel dizzy. This can increase your chance of falling. Ask your doctor what other things that you can do to help prevent falls. This information is not intended to replace advice given to you by your health care provider. Make sure you discuss any  questions you have with your health care provider. Document Released: 05/23/2009 Document Revised: 01/02/2016 Document Reviewed: 08/31/2014 Elsevier Interactive Patient Education  2017 [employer].

## 2024-03-22 NOTE — Addendum Note (Signed)
 Addended by: Romelo Sciandra P on: 03/22/2024 12:47 PM   Modules accepted: Orders

## 2024-03-22 NOTE — Progress Notes (Deleted)
 Subjective:   Melissa [name] is a 89 y.o. female who presents for Medicare Annual (Subsequent) preventive examination.  Visit Complete: Virtual I connected with  Melissa [name] on 03/22/24 by a audio enabled telemedicine application and verified that I am speaking with the correct person using two identifiers.  Patient Location: Home  Provider Location: Office/Clinic  I discussed the limitations of evaluation and management by telemedicine. The patient expressed understanding and agreed to proceed.  Vital Signs: Because this visit was a virtual/telehealth visit, some criteria may be missing or patient reported. Any vitals not documented were not able to be obtained and vitals that have been documented are patient reported.  Patient Medicare AWV questionnaire was completed by the patient on 03/22/2024; I have confirmed that all information answered by patient is correct and no changes since this date.  Cardiac Risk Factors include: advanced age (>106men, >61 women);dyslipidemia;hypertension     Objective:    Today's Vitals   03/22/24 0906  Weight: 136 lb (61.7 kg)  Height: 4' 10 (1.473 m)   Body mass index is 28.42 kg/m.     11/10/2022    1:07 PM 03/05/2022   11:09 AM  Advanced Directives  Does Patient Have a Medical Advance Directive? Yes Yes  Type of Estate agent of [place];Living will Living will;Healthcare Power of Attorney  Does patient want to make changes to medical advance directive?  No - Patient declined  Copy of Healthcare Power of Attorney in Chart? No - copy requested No - copy requested    Current Medications (verified) Outpatient Encounter Medications as of 03/22/2024  Medication Sig   ALPRAZolam  (XANAX ) 0.5 MG tablet TAKE 1 TABLET BY MOUTH IN THE MORNING AND AT BEDTIME   atorvastatin  (LIPITOR) 80 MG tablet TAKE 1 TABLET(80 MG) BY MOUTH DAILY   cephALEXin  (KEFLEX ) 500 MG capsule Take 1 capsule (500 mg total) by mouth 2 (two) times  daily.   cholecalciferol (VITAMIN D) 1000 units tablet Take 1,000 Units by mouth daily.   ciclopirox  (PENLAC ) 8 % solution Apply topically at bedtime. Apply thin layer over nail. Apply daily over previous coat. Remove weekly with polish remover.   clopidogrel  (PLAVIX ) 75 MG tablet TAKE 1 TABLET(75 MG) BY MOUTH DAILY   furosemide  (LASIX ) 40 MG tablet Take 1 tablet (40 mg total) by mouth daily as needed.   HYDROcodone -acetaminophen  (NORCO/VICODIN) 5-325 MG tablet One tab by mouth every 8 hours as needed for pain.   meloxicam  (MOBIC ) 15 MG tablet Take 15 mg by mouth daily.   Menthol, Topical Analgesic, (BIOFREEZE EX) Apply 1 Application topically daily as needed (pain).   mirtazapine  (REMERON ) 15 MG tablet TAKE 1/2-1 TABLET BY MOUTH AT BEDTIME   Multiple Vitamins-Minerals (CENTRUM SILVER 50+WOMEN) TABS Take 1 tablet by mouth daily.   potassium chloride  SA (KLOR-CON  M) 20 MEQ tablet Take 2 tablets (40 mEq total) by mouth daily.   sertraline  (ZOLOFT ) 100 MG tablet Take 1 tablet (100 mg total) by mouth daily.   TURMERIC PO Take 1 capsule by mouth daily.   valsartan  (DIOVAN ) 80 MG tablet TAKE 1 TABLET(80 MG) BY MOUTH DAILY   Facility-Administered Encounter Medications as of 03/22/2024  Medication   denosumab  (PROLIA ) injection 60 mg    Allergies (verified) Aspirin, Codeine, and Tramadol   History: Past Medical History:  Diagnosis Date   Allergy    Alzheimer disease (HCC)    Anxiety    Arthritis    Bladder dysfunction    Bulging lumbar disc  Depression    Hyperlipidemia    Hypertension    Low back pain    Lumbar radiculopathy    Spinal stenosis    Past Surgical History:  Procedure Laterality Date   bladder dysfunction     bladder stimulator     BREAST CYST EXCISION     CATARACT EXTRACTION     INTERSTIM IMPLANT REMOVAL N/A 11/17/2022   Procedure: REMOVAL OF INTERSTIM IMPLANT;  Surgeon: Elisabeth Valli BIRCH, MD;  Location: [facility];  Service: Urology;  Laterality: N/A;  60 MINUTES    ROTATOR CUFF REPAIR Right    WISDOM TOOTH EXTRACTION     Family History  Problem Relation Age of Onset   Arthritis Mother    Diabetes Mother    Alcohol abuse Father    Anxiety disorder Sister    Anxiety disorder Granddaughter    Anxiety disorder Niece    Social History   Socioeconomic History   Marital status: Widowed    Spouse name: Not on file   Number of children: 2   Years of education: Not on file   Highest education level: Bachelor's degree (e.g., BA, AB, BS)  Occupational History   Not on file  Tobacco Use   Smoking status: Never   Smokeless tobacco: Never  Vaping Use   Vaping status: Never Used  Substance and Sexual Activity   Alcohol use: Not Currently    Comment: Reports 1/2 glass of wine periodically   Drug use: Never   Sexual activity: Not Currently    Birth control/protection: None  Other Topics Concern   Not on file  Social History Narrative   01/06/22 lives with dgtr Leeroy   Social Drivers of Health   Financial Resource Strain: Low Risk  (03/22/2024)   Overall Financial Resource Strain (CARDIA)    Difficulty of Paying Living Expenses: Not hard at all  Food Insecurity: No Food Insecurity (03/22/2024)   Hunger Vital Sign    Worried About Running Out of Food in the Last Year: Never true    Ran Out of Food in the Last Year: Never true  Transportation Needs: No Transportation Needs (03/21/2024)   PRAPARE - Administrator, Civil Service (Medical): No    Lack of Transportation (Non-Medical): No  Physical Activity: Inactive (03/22/2024)   Exercise Vital Sign    Days of Exercise per Week: 0 days    Minutes of Exercise per Session: 10 min  Stress: Stress Concern Present (03/22/2024)   [employer] of Occupational Health - Occupational Stress Questionnaire    Feeling of Stress: Very much  Social Connections: Moderately Integrated (03/22/2024)   Social Connection and Isolation Panel    Frequency of Communication with Friends and Family: More  than three times a week    Frequency of Social Gatherings with Friends and Family: Patient declined    Attends Religious Services: 1 to 4 times per year    Active Member of [employer] or Organizations: No    Attends Banker Meetings: 1 to 4 times per year    Marital Status: Widowed  Recent Concern: Social Connections - Moderately Isolated (03/21/2024)   Social Connection and Isolation Panel    Frequency of Communication with Friends and Family: More than three times a week    Frequency of Social Gatherings with Friends and Family: Patient declined    Attends Religious Services: 1 to 4 times per year    Active Member of [employer] or Organizations: No    Attends [employer]  or Organization Meetings: Not on file    Marital Status: Widowed    Tobacco Counseling Counseling given: No   Clinical Intake:                        Activities of Daily Living    03/22/2024    9:17 AM 03/22/2024    9:11 AM  In your present state of health, do you have any difficulty performing the following activities:  Hearing? 0 0  Vision? 0 0  Difficulty concentrating or making decisions? 1 1  Walking or climbing stairs? 1 1  Dressing or bathing? 0 0  Doing errands, shopping? 1 1  Preparing Food and eating ? N N  Using the Toilet? N N  In the past six months, have you accidently leaked urine? Y Y  Do you have problems with loss of bowel control? N N  Managing your Medications? N N  Managing your Finances? Y Y  Housekeeping or managing your Housekeeping? CINDERELLA CINDERELLA    Patient Care Team: Perri Ronal PARAS, MD as PCP - General (Internal Medicine) Santo Stanly LABOR, MD as PCP - Cardiology (Cardiology) Baxley, Ronal PARAS, MD (Internal Medicine) Santo Stanly LABOR, MD as Consulting Physician (Cardiology)  Indicate any recent Medical Services you may have received from other than [facility] providers in the past year (date may be approximate).     Assessment:   This is a routine wellness examination  for [place].  Hearing/Vision screen No results found.   Goals Addressed   None    Depression Screen    03/22/2024    9:08 AM 03/21/2024   10:21 AM 03/08/2023   12:19 PM 10/02/2022   11:16 AM 08/27/2022    4:02 PM 08/27/2022    3:54 PM 03/05/2022   11:10 AM  PHQ 2/9 Scores  PHQ - 2 Score 6 6 0 0 6 1 6   PHQ- 9 Score 15 15   16  6     Fall Risk    03/22/2024    9:18 AM 03/22/2024    9:12 AM 03/22/2024    9:11 AM 03/08/2023    1:02 PM 10/02/2022   11:15 AM  Fall Risk   Falls in the past year? 1 1 1  0 0  Number falls in past yr: 1 1 1   0  Injury with Fall? 0 0 0  0  Risk for fall due to : Impaired balance/gait    Impaired balance/gait  Follow up Falls evaluation completed;Education provided;Falls prevention discussed    Falls prevention discussed    MEDICARE RISK AT HOME: Medicare Risk at Home Any stairs in or around the home?: Yes If so, are there any without handrails?: No Home free of loose throw rugs in walkways, pet beds, electrical cords, etc?: Yes Adequate lighting in your home to reduce risk of falls?: Yes Life alert?: No Use of a cane, walker or w/c?: Yes Grab bars in the bathroom?: Yes Shower chair or bench in shower?: Yes Elevated toilet seat or a handicapped toilet?: No  TIMED UP AND GO:  Was the test performed?  No    Cognitive Function:    01/06/2022   11:30 AM  MMSE - Mini Mental State Exam  Orientation to time 5  Orientation to Place 4  Registration 3  Attention/ Calculation 0  Recall 2  Language- name 2 objects 2  Language- repeat 0  Language- follow 3 step command 3  Language- read & follow direction  1  Write a sentence 1  Copy design 0  Total score 21        03/05/2022   11:12 AM  6CIT Screen  What Year? 0 points  What month? 0 points  What time? 0 points  Count back from 20 0 points  Months in reverse 2 points  Repeat phrase 2 points  Total Score 4 points    Immunizations Immunization History  Administered Date(s) Administered    Fluad Quad(high Dose 65+) 05/12/2022   Hep A / Hep B 01/28/1996   Influenza, High Dose Seasonal PF 06/14/2015, 04/16/2017, 06/09/2019   Influenza,inj,Quad PF,6+ Mos 04/20/2008, 04/08/2009, 05/08/2011, 05/15/2014, 05/13/2016, 04/16/2017   Influenza-Unspecified 05/24/2020, 06/16/2021   PFIZER Comirnaty(Gray Top)Covid-19 Tri-Sucrose Vaccine 09/21/2019, 10/16/2019, 05/24/2020, 11/27/2020, 05/12/2022   PFIZER(Purple Top)SARS-COV-2 Vaccination 09/21/2019, 10/16/2019, 05/24/2020, 11/27/2020   Pfizer Covid-19 Vaccine Bivalent Booster 34yrs & up 08/08/2021   Pneumococcal Conjugate-13 02/25/2018, 03/14/2018   Pneumococcal Polysaccharide-23 02/27/2019   Smallpox 08/31/1967   Td 01/25/1995   Typhoid Parenteral 01/26/1961   Typhoid Parenteral, AKD ([place]  Military) 01/26/1961   Yellow Fever 02/01/1995   Zoster, Live 05/15/2014    TDAP status: Due, Education has been provided regarding the importance of this vaccine. Advised may receive this vaccine at local pharmacy or Health Dept. Aware to provide a copy of the vaccination record if obtained from local pharmacy or Health Dept. Verbalized acceptance and understanding.  Flu Vaccine status: Due, Education has been provided regarding the importance of this vaccine. Advised may receive this vaccine at local pharmacy or Health Dept. Aware to provide a copy of the vaccination record if obtained from local pharmacy or Health Dept. Verbalized acceptance and understanding.  Pneumococcal vaccine status: Up to date  Covid-19 vaccine status: Information provided on how to obtain vaccines.   Qualifies for Shingles Vaccine? Yes   Zostavax completed Yes   Shingrix Completed?: No.    Education has been provided regarding the importance of this vaccine. Patient has been advised to call insurance company to determine out of pocket expense if they have not yet received this vaccine. Advised may also receive vaccine at local pharmacy or Health Dept. Verbalized acceptance  and understanding.  Screening Tests Health Maintenance  Topic Date Due   Zoster Vaccines- Shingrix (1 of 2) 06/23/1952   COVID-19 Vaccine (11 - 2024-25 season) 04/11/2023   INFLUENZA VACCINE  03/10/2024   DTaP/Tdap/Td (2 - Tdap) 03/21/2025 (Originally 01/24/2005)   MAMMOGRAM  06/13/2024   Medicare Annual Wellness (AWV)  03/22/2025   Pneumococcal Vaccine: 50+ Years  Completed   DEXA SCAN  Completed   HPV VACCINES  Aged Out   Meningococcal B Vaccine  Aged Out   Hepatitis B Vaccines  Discontinued    Health Maintenance  Health Maintenance Due  Topic Date Due   Zoster Vaccines- Shingrix (1 of 2) 06/23/1952   COVID-19 Vaccine (11 - 2024-25 season) 04/11/2023   INFLUENZA VACCINE  03/10/2024    Colorectal cancer screening: No longer required.   Mammogram status: Completed 06/14/2023. Repeat every year  Bone Density status: Completed 09/08/2022. Results reflect: Bone density results: OSTEOPOROSIS. Repeat every 2 years.  Lung Cancer Screening: (Low Dose CT Chest recommended if Age 42-80 years, 20 pack-year currently smoking OR have quit w/in 15years.) does not qualify.   Additional Screening:  Hepatitis C Screening: does not qualify; Completed   Vision Screening: Recommended annual ophthalmology exams for early detection of glaucoma and other disorders of the eye. Is the patient up to date with their  annual eye exam?  Yes  Who is the provider or what is the name of the office in which the patient attends annual eye exams? [facility]  If pt is not established with a provider, would they like to be referred to a provider to establish care? No .   Dental Screening: Recommended annual dental exams for proper oral hygiene   Community Resource Referral / Chronic Care Management: CRR required this visit?  No   CCM required this visit?  No     Plan:     I have personally reviewed and noted the following in the patient's chart:   Medical and social history Use of  alcohol, tobacco or illicit drugs  Current medications and supplements including opioid prescriptions. Patient is not currently taking opioid prescriptions. Functional ability and status Nutritional status Physical activity Advanced directives List of other physicians Hospitalizations, surgeries, and ER visits in previous 12 months Vitals Screenings to include cognitive, depression, and falls Referrals and appointments  In addition, I have reviewed and discussed with patient certain preventive protocols, quality metrics, and best practice recommendations. A written personalized care plan for preventive services as well as general preventive health recommendations were provided to patient.     Manal Kreutzer Zelda, CMA   03/22/2024   After Visit Summary: (Mail) Due to this being a telephonic visit, the after visit summary with patients personalized plan was offered to patient via mail    I have reviewed and agree with the above Annual Wellness Visit documentation.  Ronal Norleen Hailstone, MD. Internal Medicine 03/22/2024

## 2024-03-27 ENCOUNTER — Encounter: Payer: Self-pay | Admitting: Podiatry

## 2024-03-27 ENCOUNTER — Ambulatory Visit (INDEPENDENT_AMBULATORY_CARE_PROVIDER_SITE_OTHER): Admitting: Podiatry

## 2024-03-27 DIAGNOSIS — L03032 Cellulitis of left toe: Secondary | ICD-10-CM | POA: Diagnosis not present

## 2024-03-27 DIAGNOSIS — B351 Tinea unguium: Secondary | ICD-10-CM | POA: Diagnosis not present

## 2024-03-27 DIAGNOSIS — M79675 Pain in left toe(s): Secondary | ICD-10-CM | POA: Diagnosis not present

## 2024-03-27 NOTE — Progress Notes (Unsigned)
 Subjective:  Patient ID: Melissa [name], female    DOB: [birth year],  MRN: [id number]  Melissa [name] presents to clinic today for fungal nail check with the left great toenail.  She was seen by her PCP last week with concern of a possible infection to that toe.  He placed her on oral antibiotics.  She states that the redness and swelling have improved.  It is less tender.  She continues with the ciclopirox  lacquer to the toenail.  She states that the toenail has not been growing that much.  She does trim her nails.  PCP is Perri Ronal PARAS, MD. last seen 03/21/2024  Past Medical History:  Diagnosis Date   Allergy    Alzheimer disease (HCC)    Anxiety    Arthritis    Bladder dysfunction    Bulging lumbar disc    Depression    Hyperlipidemia    Hypertension    Low back pain    Lumbar radiculopathy    Spinal stenosis    Past Surgical History:  Procedure Laterality Date   bladder dysfunction     bladder stimulator     BREAST CYST EXCISION     CATARACT EXTRACTION     INTERSTIM IMPLANT REMOVAL N/A 11/17/2022   Procedure: REMOVAL OF RENNA IMPLANT;  Surgeon: Elisabeth Valli BIRCH, MD;  Location: [facility];  Service: Urology;  Laterality: N/A;  60 MINUTES   ROTATOR CUFF REPAIR Right    WISDOM TOOTH EXTRACTION     Allergies  Allergen Reactions   Aspirin Nausea Only    Nausea, dizziness, malaise.   Codeine     Fatigue, GI Intolerance   Tramadol     fatigue    Review of Systems: Negative except as noted in the HPI.  Objective:  Vascular Examination: Capillary refill time is 3-5 seconds to toes bilateral. Palpable pedal pulses b/l LE. Digital hair present b/l.    Dermatological Examination: Pedal skin with normal turgor, texture and tone b/l. No open wounds. No interdigital macerations b/l.  The left hallux nail toenails is 4mm thick, discolored, dystrophic with subungual debris. There is minimal pain with compression of the nail plates.  There is a softer area on the lateral  aspect of the nail margin with small eschar formation from what appears to be recent drainage.  No purulence is noted.  No calor or erythema is noted to the toe today.    Assessment/Plan: 1. Dermatophytosis of nail   2. Pain in left toe(s)   3. Cellulitis of left toe     The mycotic toenail on the left great toenail was sharply debrided with sterile nail nippers and a power debriding burr to decrease bulk/thickness and length.    Patient to discontinue application of the ciclopirox  lacquer for 1 week and apply Neosporin to the lateral nail margin.  As long as the nail looks as good or better as it does today, she can then resume the ciclopirox  lacquer.  Follow-up 3 months for fungal nail recheck   Tonni Mansour DSABRA [place], DPM, FACFAS [facility]     [address] [place] [address]                Office [phone]  Fax [phone]

## 2024-04-24 DIAGNOSIS — F4542 Pain disorder with related psychological factors: Secondary | ICD-10-CM | POA: Diagnosis not present

## 2024-05-03 DIAGNOSIS — G544 Lumbosacral root disorders, not elsewhere classified: Secondary | ICD-10-CM | POA: Diagnosis not present

## 2024-05-03 DIAGNOSIS — G541 Lumbosacral plexus disorders: Secondary | ICD-10-CM | POA: Diagnosis not present

## 2024-05-23 DIAGNOSIS — Z23 Encounter for immunization: Secondary | ICD-10-CM | POA: Diagnosis not present

## 2024-05-29 DIAGNOSIS — M5116 Intervertebral disc disorders with radiculopathy, lumbar region: Secondary | ICD-10-CM | POA: Diagnosis not present

## 2024-05-29 DIAGNOSIS — M48062 Spinal stenosis, lumbar region with neurogenic claudication: Secondary | ICD-10-CM | POA: Diagnosis not present

## 2024-05-29 DIAGNOSIS — G544 Lumbosacral root disorders, not elsewhere classified: Secondary | ICD-10-CM | POA: Diagnosis not present

## 2024-05-30 ENCOUNTER — Telehealth: Payer: Self-pay | Admitting: *Deleted

## 2024-05-30 DIAGNOSIS — M810 Age-related osteoporosis without current pathological fracture: Secondary | ICD-10-CM

## 2024-05-30 NOTE — Telephone Encounter (Signed)
 It looks like patient did not get Prolia  injection at her August appointment.  She can still get if she likes.  Please call to schedule her and have Dr. Perri order for her.  Its no charge if she still would like to get it.

## 2024-06-01 ENCOUNTER — Other Ambulatory Visit: Payer: Self-pay | Admitting: Internal Medicine

## 2024-06-07 ENCOUNTER — Other Ambulatory Visit: Payer: Self-pay | Admitting: Internal Medicine

## 2024-06-07 ENCOUNTER — Ambulatory Visit
Admission: RE | Admit: 2024-06-07 | Discharge: 2024-06-07 | Disposition: A | Discharge status: Home / Self Care | Payer: Self-pay | Source: Ambulatory Visit | Attending: Family Medicine | Admitting: Family Medicine

## 2024-06-07 VITALS — BP 136/79 | HR 92 | Temp 97.5°F | Resp 16

## 2024-06-07 DIAGNOSIS — B351 Tinea unguium: Secondary | ICD-10-CM

## 2024-06-07 DIAGNOSIS — Z1231 Encounter for screening mammogram for malignant neoplasm of breast: Secondary | ICD-10-CM

## 2024-06-07 MED ORDER — CEPHALEXIN 250 MG PO CAPS: 250.0000 mg | ORAL_CAPSULE | Freq: Three times a day (TID) | ORAL | 0 refills | Status: DC

## 2024-06-07 NOTE — ED Triage Notes (Signed)
 Pt reports pain, numbness and swelling in the  eft big toe after she cut the nail. Per family member, she just noticed, she do not know for how long is going on.  Pt follow up with Podiatrist for fungal infection.

## 2024-06-07 NOTE — ED Provider Notes (Signed)
 [facility]  Note:  This document was prepared using Conservation officer, historic buildings and may include unintentional dictation errors.  MRN: [id number] DOB: 02/01/1931  Subjective:   Melissa [name] is a 89 y.o. female presenting for several day history of left great toe pain, swelling, redness.  Patient has been undergoing difficulty with onychomycosis for the better part of the year.  Lately she has been picking at her nail and this has led to infection in the past.  Has not followed up with podiatry.   Current Facility-Administered Medications:    denosumab  (PROLIA ) injection 60 mg, 60 mg, Subcutaneous, Q6 months, Baxley, Ronal PARAS, MD  Current Outpatient Medications:    ALPRAZolam  (XANAX ) 0.5 MG tablet, TAKE 1 TABLET BY MOUTH IN THE MORNING AND AT BEDTIME, Disp: 180 tablet, Rfl: 3   atorvastatin  (LIPITOR) 80 MG tablet, TAKE 1 TABLET(80 MG) BY MOUTH DAILY, Disp: 90 tablet, Rfl: 3   cephALEXin  (KEFLEX ) 500 MG capsule, Take 1 capsule (500 mg total) by mouth 2 (two) times daily., Disp: 10 capsule, Rfl: 0   cholecalciferol (VITAMIN D) 1000 units tablet, Take 1,000 Units by mouth daily., Disp: , Rfl:    ciclopirox  (PENLAC ) 8 % solution, Apply topically at bedtime. Apply thin layer over nail. Apply daily over previous coat. Remove weekly with polish remover., Disp: 6.6 mL, Rfl: 11   clopidogrel  (PLAVIX ) 75 MG tablet, TAKE 1 TABLET(75 MG) BY MOUTH DAILY, Disp: 90 tablet, Rfl: 1   furosemide  (LASIX ) 40 MG tablet, Take 1 tablet (40 mg total) by mouth daily as needed., Disp: 30 tablet, Rfl: 11   HYDROcodone -acetaminophen  (NORCO/VICODIN) 5-325 MG tablet, One tab by mouth every 8 hours as needed for pain., Disp: 15 tablet, Rfl: 0   meloxicam  (MOBIC ) 15 MG tablet, Take 15 mg by mouth daily., Disp: , Rfl:    Menthol, Topical Analgesic, (BIOFREEZE EX), Apply 1 Application topically daily as needed (pain)., Disp: , Rfl:    mirtazapine  (REMERON ) 15 MG tablet, TAKE 1/2-1 TABLET BY  MOUTH AT BEDTIME, Disp: 90 tablet, Rfl: 1   Multiple Vitamins-Minerals (CENTRUM SILVER 50+WOMEN) TABS, Take 1 tablet by mouth daily., Disp: , Rfl:    potassium chloride  SA (KLOR-CON  M) 20 MEQ tablet, Take 2 tablets (40 mEq total) by mouth daily., Disp: 180 tablet, Rfl: 1   sertraline  (ZOLOFT ) 100 MG tablet, TAKE 1 TABLET(100 MG) BY MOUTH DAILY, Disp: 90 tablet, Rfl: 3   TURMERIC PO, Take 1 capsule by mouth daily., Disp: , Rfl:    valsartan  (DIOVAN ) 80 MG tablet, TAKE 1 TABLET(80 MG) BY MOUTH DAILY, Disp: 90 tablet, Rfl: 1   Allergies  Allergen Reactions   Aspirin Nausea Only    Nausea, dizziness, malaise.   Codeine     Fatigue, GI Intolerance   Tramadol     fatigue    Past Medical History:  Diagnosis Date   Allergy    Alzheimer disease (HCC)    Anxiety    Arthritis    Bladder dysfunction    Bulging lumbar disc    Depression    Hyperlipidemia    Hypertension    Low back pain    Lumbar radiculopathy    Spinal stenosis      Past Surgical History:  Procedure Laterality Date   bladder dysfunction     bladder stimulator     BREAST CYST EXCISION     CATARACT EXTRACTION     INTERSTIM IMPLANT REMOVAL N/A 11/17/2022   Procedure: REMOVAL OF INTERSTIM IMPLANT;  Surgeon: Elisabeth Valli BIRCH, MD;  Location: [facility];  Service: Urology;  Laterality: N/A;  60 MINUTES   ROTATOR CUFF REPAIR Right    WISDOM TOOTH EXTRACTION      Family History  Problem Relation Age of Onset   Arthritis Mother    Diabetes Mother    Alcohol abuse Father    Anxiety disorder Sister    Anxiety disorder Granddaughter    Anxiety disorder Niece     Social History   Tobacco Use   Smoking status: Never   Smokeless tobacco: Never  Vaping Use   Vaping status: Never Used  Substance Use Topics   Alcohol use: Not Currently    Comment: Reports 1/2 glass of wine periodically   Drug use: Never    ROS   Objective:   Vitals: BP 136/79 (BP Location: Right Arm)   Pulse 92   Temp (!) 97.5 F (36.4 C)  (Oral)   Resp 16   SpO2 95%   Physical Exam Constitutional:      General: She is not in acute distress.    Appearance: Normal appearance. She is well-developed. She is not ill-appearing, toxic-appearing or diaphoretic.  HENT:     Head: Normocephalic and atraumatic.     Nose: Nose normal.     Mouth/Throat:     Mouth: Mucous membranes are moist.  Eyes:     General: No scleral icterus.       Right eye: No discharge.        Left eye: No discharge.     Extraocular Movements: Extraocular movements intact.  Cardiovascular:     Rate and Rhythm: Normal rate.  Pulmonary:     Effort: Pulmonary effort is normal.  Musculoskeletal:     Comments: Trace swelling with erythema about the left great toe.  No sign of paronychia.  Obvious onychomycosis of the left great toenail with significant defects of the nail.  Skin:    General: Skin is warm and dry.  Neurological:     General: No focal deficit present.     Mental Status: She is alert and oriented to person, place, and time.  Psychiatric:        Mood and Affect: Mood normal.        Behavior: Behavior normal.     Assessment and Plan :   PDMP not reviewed this encounter.  1. Onychomycosis    Will address secondary infection with cephalexin  250 mg 3 times daily for 5 days.  Emphasized need to follow-up with podiatry.  For now recommend conservative management and avoidance of irritating the skin of the left great toenail.  Counseled patient on potential for adverse effects with medications prescribed/recommended today, ER and return-to-clinic precautions discussed, patient verbalized understanding.    Christopher Savannah, [place] 06/07/24 1157

## 2024-06-07 NOTE — Discharge Instructions (Signed)
 Start cephalexin  to help against infection. Take this medication 3 times daily with food for 5 days. Avoid injuring the nail bed tissue. Follow up with the podiatrist.

## 2024-06-14 NOTE — Telephone Encounter (Signed)
 Spoke with patient daughter Leeroy and she stated that prescription was sent in to [facility] back when she was due and did not keep the medication refrigerated.  Advised patient that we were suppose to send to specialty pharmacy and apologized.  This cost her $60.  She stated that she called Dr. Jerrol office and they told them it was ok to wait until 6 months which will be in Feb 2026.  Also she stated that pt did not have surgery on her back and that it was a procedure.  Will put in referral for Jan for next prolia .

## 2024-06-14 NOTE — Telephone Encounter (Signed)
 Spoke with patient about getting Prolia  injection.  She stated that she just had surgery on her back and wanted to know if it is ok to still get the injection?

## 2024-06-26 ENCOUNTER — Ambulatory Visit: Attending: Internal Medicine | Admitting: Internal Medicine

## 2024-06-26 VITALS — BP 169/86 | HR 70 | Ht 59.0 in | Wt 140.2 lb

## 2024-06-26 DIAGNOSIS — E785 Hyperlipidemia, unspecified: Secondary | ICD-10-CM | POA: Diagnosis not present

## 2024-06-26 DIAGNOSIS — I071 Rheumatic tricuspid insufficiency: Secondary | ICD-10-CM | POA: Insufficient documentation

## 2024-06-26 DIAGNOSIS — I10 Essential (primary) hypertension: Secondary | ICD-10-CM | POA: Insufficient documentation

## 2024-06-26 DIAGNOSIS — I739 Peripheral vascular disease, unspecified: Secondary | ICD-10-CM | POA: Diagnosis not present

## 2024-06-26 DIAGNOSIS — I361 Nonrheumatic tricuspid (valve) insufficiency: Secondary | ICD-10-CM | POA: Diagnosis not present

## 2024-06-26 MED ORDER — FUROSEMIDE 40 MG PO TABS: 40.0000 mg | ORAL_TABLET | Freq: Every day | ORAL | 3 refills | Status: AC

## 2024-06-26 NOTE — Progress Notes (Signed)
 Cardiology Office Note:    Date:  06/26/2024   ID:  Melissa [name], DOB 05-12-1931, MRN [id number]  PCP:  Perri Ronal PARAS, MD   [facility]  Cardiologist:  Stanly DELENA Leavens, MD  Advanced Practice Provider:  No care team member to display Electrophysiologist:  None   CC: LE follow up  History of Present Illness:    Melissa [name] is a 89 y.o. female with a hx of HTN, HLD, Spinal stenosis who presents for evaluation 09/23/20.  2022: Patient had mildly abnormal TBI's, Severe TR.  On minimal diuretics her volume status improved but repeat echo showed persistent severe TR.   2023: Called in; she stopped her lasix  inadverently and had leg swelling.  Started diuretic and this improved.  Heart creatinine was worse but we did not stop her diuretics due to her leg swelling and problems stopping in the past.  Has worsening myalgias. 2024: Increased DOE.  Ms. Melissa [name] is a 89 year old female with moderate to severe tricuspid regurgitation who presents with peripheral edema and tricuspid regurgitation.  She has a history of moderate to severe tricuspid regurgitation, last evaluated with an echocardiogram on June 25, 2023. Her symptoms include leg swelling, and she reports frequent urination as a side effect of her diuretic use. The swelling extends from her toes, and she takes a daily diuretic to manage fluid retention. Despite these symptoms, she remains active, engaging in activities such as gardening.  She also has a history of spinal stenosis, which causes significant back pain and impacts her mobility, contributing to her overall discomfort and potentially affecting her ability to engage in physical activities.  In addition to her cardiac issues, she has mild peripheral arterial disease. She is taking Plavix  (clopidogrel ) to prevent further worsening. This medication was prescribed following an ultrasound of her legs that revealed mild disease, although no  intervention was required at that time.  Her medication regimen includes a daily diuretic, Lasix , which she sometimes skips.  Past Medical History:  Diagnosis Date   Allergy    Alzheimer disease (HCC)    Anxiety    Arthritis    Bladder dysfunction    Bulging lumbar disc    Depression    Hyperlipidemia    Hypertension    Low back pain    Lumbar radiculopathy    Spinal stenosis     Past Surgical History:  Procedure Laterality Date   bladder dysfunction     bladder stimulator     BREAST CYST EXCISION     CATARACT EXTRACTION     INTERSTIM IMPLANT REMOVAL N/A 11/17/2022   Procedure: REMOVAL OF INTERSTIM IMPLANT;  Surgeon: Elisabeth Valli BIRCH, MD;  Location: [facility];  Service: Urology;  Laterality: N/A;  60 MINUTES   ROTATOR CUFF REPAIR Right    WISDOM TOOTH EXTRACTION      Current Medications: Current Meds  Medication Sig   ALPRAZolam  (XANAX ) 0.5 MG tablet TAKE 1 TABLET BY MOUTH IN THE MORNING AND AT BEDTIME   atorvastatin  (LIPITOR) 80 MG tablet TAKE 1 TABLET(80 MG) BY MOUTH DAILY   cholecalciferol (VITAMIN D) 1000 units tablet Take 1,000 Units by mouth daily.   ciclopirox  (PENLAC ) 8 % solution Apply topically at bedtime. Apply thin layer over nail. Apply daily over previous coat. Remove weekly with polish remover.   clobetasol (TEMOVATE) 0.05 % external solution as needed (itching).   clopidogrel  (PLAVIX ) 75 MG tablet TAKE 1 TABLET(75 MG) BY MOUTH DAILY   furosemide  (LASIX ) 40  MG tablet Take 1 tablet (40 mg total) by mouth daily as needed.   furosemide  (LASIX ) 40 MG tablet Take 1 tablet (40 mg total) by mouth daily.   Menthol, Topical Analgesic, (BIOFREEZE EX) Apply 1 Application topically daily as needed (pain).   mirtazapine  (REMERON ) 15 MG tablet TAKE 1/2-1 TABLET BY MOUTH AT BEDTIME   Multiple Vitamins-Minerals (CENTRUM SILVER 50+WOMEN) TABS Take 1 tablet by mouth daily.   potassium chloride  SA (KLOR-CON  M) 20 MEQ tablet Take 2 tablets (40 mEq total) by mouth daily.    sertraline  (ZOLOFT ) 100 MG tablet TAKE 1 TABLET(100 MG) BY MOUTH DAILY   TURMERIC PO Take 1 capsule by mouth daily.   valsartan  (DIOVAN ) 80 MG tablet TAKE 1 TABLET(80 MG) BY MOUTH DAILY   Current Facility-Administered Medications for the 06/26/24 encounter (Office Visit) with Santo Stanly LABOR, MD  Medication   denosumab  (PROLIA ) injection 60 mg     Allergies:   Aspirin, Codeine, and Tramadol   Social History   Socioeconomic History   Marital status: Widowed    Spouse name: Not on file   Number of children: 2   Years of education: Not on file   Highest education level: Bachelor's degree (e.g., BA, AB, BS)  Occupational History   Not on file  Tobacco Use   Smoking status: Never   Smokeless tobacco: Never  Vaping Use   Vaping status: Never Used  Substance and Sexual Activity   Alcohol use: Not Currently    Comment: Reports 1/2 glass of wine periodically   Drug use: Never   Sexual activity: Not Currently    Birth control/protection: None  Other Topics Concern   Not on file  Social History Narrative   01/06/22 lives with dgtr Leeroy   Social Drivers of Health   Financial Resource Strain: Low Risk  (03/22/2024)   Overall Financial Resource Strain (CARDIA)    Difficulty of Paying Living Expenses: Not hard at all  Food Insecurity: No Food Insecurity (03/22/2024)   Hunger Vital Sign    Worried About Running Out of Food in the Last Year: Never true    Ran Out of Food in the Last Year: Never true  Transportation Needs: No Transportation Needs (03/21/2024)   PRAPARE - Administrator, Civil Service (Medical): No    Lack of Transportation (Non-Medical): No  Physical Activity: Inactive (03/22/2024)   Exercise Vital Sign    Days of Exercise per Week: 0 days    Minutes of Exercise per Session: 10 min  Stress: Stress Concern Present (03/22/2024)   [employer] of Occupational Health - Occupational Stress Questionnaire    Feeling of Stress: Very much  Social  Connections: Moderately Integrated (03/22/2024)   Social Connection and Isolation Panel    Frequency of Communication with Friends and Family: More than three times a week    Frequency of Social Gatherings with Friends and Family: Patient declined    Attends Religious Services: 1 to 4 times per year    Active Member of [employer] or Organizations: No    Attends Banker Meetings: 1 to 4 times per year    Marital Status: Widowed  Recent Concern: Social Connections - Moderately Isolated (03/21/2024)   Social Connection and Isolation Panel    Frequency of Communication with Friends and Family: More than three times a week    Frequency of Social Gatherings with Friends and Family: Patient declined    Attends Religious Services: 1 to 4 times per year  Active Member of Clubs or Organizations: No    Attends Banker Meetings: Not on file    Marital Status: Widowed     Family History: The patient's family history includes Alcohol abuse in her father; Anxiety disorder in her granddaughter, niece, and sister; Arthritis in her mother; Diabetes in her mother.  History of coronary artery disease notable for no members. History of heart failure notable for no members. History of arrhythmia notable for no members.  ROS:   Please see the history of present illness.     EKGs/Labs/Other Studies Reviewed:     EKG:  07/01/22: SR inferior TWI low voltage rate 80 12/22/21: SR inferior 79 TWI 09/23/20: SR rate 97, Nonspecific TWI 09/09/20: SR rate 78 with non-specific TWI  Cardiac Studies & Procedures   ______________________________________________________________________________________________     ECHOCARDIOGRAM  ECHOCARDIOGRAM COMPLETE 06/21/2023  Narrative ECHOCARDIOGRAM REPORT    Patient Name:   NAKEYA ADINOLFI Date of Exam: 06/21/2023 Medical Rec #:  [id number]        Height:       58.0 in Accession #:    [id number]       Weight:       140.0 lb Date of Birth:   1930-12-16       BSA:          1.565 m Patient Age:    89 years         BP:           142/80 mmHg Patient Gender: F                HR:           79 bpm. Exam Location:  [address]  Procedure: 2D Echo, Cardiac Doppler and Color Doppler  Indications:    I07.1 TR  History:        Patient has prior history of Echocardiogram examinations, most recent 06/17/2022. TR; Risk Factors:Hypertension and Dyslipidemia.  Sonographer:    Elsie Bohr RDCS Referring Phys: [id number] [facility] A Krishika Bugge  IMPRESSIONS   1. Left ventricular ejection fraction, by estimation, is 55 to 60%. The left ventricle has normal function. The left ventricle has no regional wall motion abnormalities. There is mild concentric left ventricular hypertrophy. Left ventricular diastolic parameters are consistent with Grade I diastolic dysfunction (impaired relaxation). 2. Right ventricular systolic function is normal. The right ventricular size is normal. There is moderately elevated pulmonary artery systolic pressure. 3. Right atrial size was mildly dilated. 4. The mitral valve is normal in structure. Trivial mitral valve regurgitation. No evidence of mitral stenosis. 5. The aortic valve is tricuspid. Aortic valve regurgitation is trivial. No aortic stenosis is present. 6. The inferior vena cava is normal in size with greater than 50% respiratory variability, suggesting right atrial pressure of 3 mmHg.  FINDINGS Left Ventricle: Left ventricular ejection fraction, by estimation, is 55 to 60%. The left ventricle has normal function. The left ventricle has no regional wall motion abnormalities. The left ventricular internal cavity size was normal in size. There is mild concentric left ventricular hypertrophy. Left ventricular diastolic parameters are consistent with Grade I diastolic dysfunction (impaired relaxation). Indeterminate filling pressures.  Right Ventricle: The right ventricular size is normal. No increase in  right ventricular wall thickness. Right ventricular systolic function is normal. There is moderately elevated pulmonary artery systolic pressure. The tricuspid regurgitant velocity is 3.56 m/s, and with an assumed right atrial pressure of 3 mmHg, the estimated right ventricular systolic pressure is 53.7  mmHg.  Left Atrium: Left atrial size was normal in size.  Right Atrium: Right atrial size was mildly dilated.  Pericardium: There is no evidence of pericardial effusion.  Mitral Valve: The mitral valve is normal in structure. Trivial mitral valve regurgitation. No evidence of mitral valve stenosis.  Tricuspid Valve: The tricuspid valve is normal in structure. Tricuspid valve regurgitation is mild . No evidence of tricuspid stenosis.  Aortic Valve: The aortic valve is tricuspid. Aortic valve regurgitation is trivial. No aortic stenosis is present.  Pulmonic Valve: The pulmonic valve was normal in structure. Pulmonic valve regurgitation is trivial. No evidence of pulmonic stenosis.  Aorta: The aortic root is normal in size and structure.  Venous: The inferior vena cava is normal in size with greater than 50% respiratory variability, suggesting right atrial pressure of 3 mmHg.  IAS/Shunts: No atrial level shunt detected by color flow Doppler.   LEFT VENTRICLE PLAX 2D LVIDd:         2.80 cm   Diastology LVIDs:         2.00 cm   LV e' medial:    7.18 cm/s LV PW:         1.30 cm   LV E/e' medial:  11.4 LV IVS:        1.20 cm   LV e' lateral:   11.70 cm/s LVOT diam:     1.70 cm   LV E/e' lateral: 7.0 LV SV:         49 LV SV Index:   31 LVOT Area:     2.27 cm   RIGHT VENTRICLE             IVC RV S prime:     20.30 cm/s  IVC diam: 1.00 cm TAPSE (M-mode): 1.8 cm RVSP:           53.7 mmHg  LEFT ATRIUM           Index        RIGHT ATRIUM           Index LA diam:      3.50 cm 2.24 cm/m   RA Pressure: 3.00 mmHg LA Vol (A2C): 39.4 ml 25.18 ml/m  RA Area:     18.20 cm LA Vol (A4C):  61.7 ml 39.43 ml/m  RA Volume:   48.90 ml  31.25 ml/m AORTIC VALVE LVOT Vmax:   95.00 cm/s LVOT Vmean:  66.300 cm/s LVOT VTI:    0.214 m  AORTA Ao Root diam: 2.70 cm Ao Asc diam:  2.80 cm  MITRAL VALVE                TRICUSPID VALVE MV Area (PHT): 4.68 cm     TR Peak grad:   50.7 mmHg MV Decel Time: 162 msec     TR Vmax:        356.00 cm/s MV E velocity: 81.80 cm/s   Estimated RAP:  3.00 mmHg MV A velocity: 103.00 cm/s  RVSP:           53.7 mmHg MV E/A ratio:  0.79 SHUNTS Systemic VTI:  0.21 m Systemic Diam: 1.70 cm  Annabella Scarce MD Electronically signed by Annabella Scarce MD Signature Date/Time: 06/21/2023/12:44:17 PM    Final          ______________________________________________________________________________________________        Recent Labs: 03/17/2024: ALT 15; BUN 24; Creat 0.91; Hemoglobin 11.1; Platelets 184; Potassium 3.6; Sodium 142; TSH 2.64  Recent Lipid Panel  Component Value Date/Time   CHOL 179 03/17/2024 0928   CHOL 168 07/01/2022 1029   TRIG 64 03/17/2024 0928   HDL 77 03/17/2024 0928   HDL 67 07/01/2022 1029   CHOLHDL 2.3 03/17/2024 0928   LDLCALC 87 03/17/2024 0928    Physical Exam:    VS:  BP (!) 169/86   Pulse 70   Ht 4' 11 (1.499 m)   Wt 140 lb 3.2 oz (63.6 kg)   SpO2 98%   BMI 28.32 kg/m     Wt Readings from Last 3 Encounters:  06/26/24 140 lb 3.2 oz (63.6 kg)  03/22/24 136 lb (61.7 kg)  03/21/24 136 lb (61.7 kg)   Gen: No distress Neck: JVD  Cardiac: No Rubs or Gallops, holosystolc 3/6 murmur, RRR , +2 radial pulses Respiratory: Clear to auscultation bilaterally, normal effort, normal  respiratory rate GI: Soft, nontender, non-distended  MS: Non pitting edema left leg;  moves all extremities Integument: Skin feels warm Neuro:  At time of evaluation, alert and oriented to person/place/time/situation  Psych: Normal affect, patient feels ok   ASSESSMENT/PLAN:     Moderate to severe tricuspid  regurgitation with peripheral edema - Moderate to severe tricuspid regurgitation with peripheral edema. Echocardiogram from 2024 indicated moderate regurgitation, but clinically assessed as moderate to severe. Peripheral edema likely due to fluid retention. Discussed potential for worsening symptoms such as increased leg swelling, liver congestion, and generalized fatigue. Consideration of tricuspid valve intervention, specifically a percutaneous tricuspid valve repair (Triclip), which does not offer mortality benefit but may reduce diuretic use and improve symptoms. Risks include procedural complications, and decision to pursue intervention is contingent on symptom progression and patient preference. Emphasized shared decision-making, with a focus on quality of life rather than longevity. - Continue daily diuretic (Lasix ) with an additional 40 mg dose if needed. - Offered echocardiogram to assess current status of tricuspid regurgitation (declined).  Declined tTEER eval - Ensure availability of extra diuretics for holidays or increased fluid retention.   Peripheral arterial disease Mild peripheral arterial disease identified on previous ultrasound. Currently managed with Plavix  (clopidogrel ) for prevention of disease progression. No current need for stenting or intervention. Anesthesiologist questioned Plavix  use during recent procedure, but no issues reported. - Continue Plavix  for peripheral arterial disease management.  HTN - Pain with cuff.  AMB WNL.  NO changes to therapy  Longitudinal care: The evaluation and management services provided today reflect the complexity inherent in caring for this patient, including the ongoing longitudinal relationship and management of multiple chronic conditions and/or the need for care coordination. The visit required a comprehensive assessment and management plan tailored to the patient's unique needs Time was spent addressing not only the acute concerns but  also the broader context of the patient's health, including preventive care, chronic disease management, and care coordination as appropriate.  Complex longitudinal is necessary for conditions including: mod-severe TR with complex T-TEER evaluation (declined after SDM)   Stanly Leavens, MD FASE [facility] Cardiologist [facility]  [address] [place], [place] [address] [phone]  11:04 AM

## 2024-06-26 NOTE — Patient Instructions (Signed)
 Medication Instructions:  Your physician has recommended you make the following change in your medication:  START: Lasix  40 mg daily *If you need a refill on your cardiac medications before your next appointment, please call your pharmacy*  Follow-Up: At [facility], you and your health needs are our priority.  As part of our continuing mission to provide you with exceptional heart care, our providers are all part of one team.  This team includes your primary Cardiologist (physician) and Advanced Practice Providers or APPs (Physician Assistants and Nurse Practitioners) who all work together to provide you with the care you need, when you need it.  Your next appointment:   1 year(s)  Provider:   Stanly DELENA Leavens, MD

## 2024-06-27 DIAGNOSIS — M479 Spondylosis, unspecified: Secondary | ICD-10-CM | POA: Diagnosis not present

## 2024-06-27 DIAGNOSIS — G8929 Other chronic pain: Secondary | ICD-10-CM | POA: Diagnosis not present

## 2024-06-27 DIAGNOSIS — M5116 Intervertebral disc disorders with radiculopathy, lumbar region: Secondary | ICD-10-CM | POA: Diagnosis not present

## 2024-06-27 DIAGNOSIS — M545 Low back pain, unspecified: Secondary | ICD-10-CM | POA: Diagnosis not present

## 2024-06-29 ENCOUNTER — Ambulatory Visit
Admission: RE | Admit: 2024-06-29 | Discharge: 2024-06-29 | Disposition: A | Source: Ambulatory Visit | Attending: Internal Medicine | Admitting: Internal Medicine

## 2024-06-29 DIAGNOSIS — Z1231 Encounter for screening mammogram for malignant neoplasm of breast: Secondary | ICD-10-CM

## 2024-07-23 ENCOUNTER — Other Ambulatory Visit: Payer: Self-pay | Admitting: Internal Medicine

## 2024-08-07 ENCOUNTER — Other Ambulatory Visit: Payer: Self-pay | Admitting: Internal Medicine

## 2024-08-30 MED ORDER — DENOSUMAB 60 MG/ML ~~LOC~~ SOSY: 60.0000 mg | PREFILLED_SYRINGE | SUBCUTANEOUS | Status: AC

## 2024-08-30 NOTE — Addendum Note (Signed)
 Addended by: ESTELLE GILLIS D on: 08/30/2024 04:27 PM   Modules accepted: Orders

## 2024-08-31 NOTE — Telephone Encounter (Signed)
 New referral placed for prolia .

## 2024-09-01 ENCOUNTER — Telehealth: Payer: Self-pay

## 2024-09-01 NOTE — Telephone Encounter (Signed)
 Prolia VOB initiated via [email or web address]  Next Prolia inj DUE: NOW

## 2024-09-04 ENCOUNTER — Other Ambulatory Visit (HOSPITAL_COMMUNITY): Payer: Self-pay

## 2024-09-04 NOTE — Telephone Encounter (Signed)
 Pt ready for scheduling for PROLIA  on or after : 09/04/24  Option# 1: Buy/Bill (Office supplied medication)  Out-of-pocket cost due at time of clinic visit: $0  Number of injection/visits approved: ---  Primary: MEDICARE Prolia  co-insurance: 0% Admin fee co-insurance: 0%  Secondary: [place] [employer] [place] Prolia  co-insurance: covers the Medicare Part B deductible, co-insurance and 100% of the excess charges. Admin fee co-insurance:   Medical Benefit Details: Date Benefits were checked: 09/01/24 Deductible: $0 Met of $283 Required/ Coinsurance: 0%/ Admin Fee: 0%  Prior Auth: N/A PA# Expiration Date:   # of doses approved: ----------------------------------------------------------------------- Option# 2- Med Obtained from pharmacy:  Pharmacy benefit: Copay $64 (Paid to pharmacy) Admin Fee: 0% (Pay at clinic)  Prior Auth: N/A PA# Expiration Date:   # of doses approved:   If patient wants fill through the pharmacy benefit please send prescription to: [facility], and include estimated need by date in rx notes. Pharmacy will ship medication directly to the office.  Patient NOT eligible for Prolia  Copay Card. Copay Card can make patient's cost as little as $25. Link to apply: https://www.amgensupportplus.com/copay  ** This summary of benefits is an estimation of the patient's out-of-pocket cost. Exact cost may very based on individual plan coverage.

## 2024-09-04 NOTE — Telephone Encounter (Signed)
 SABRA

## 2024-09-06 ENCOUNTER — Other Ambulatory Visit: Payer: Self-pay | Admitting: Internal Medicine

## 2024-09-07 ENCOUNTER — Telehealth: Payer: Self-pay | Admitting: *Deleted

## 2024-09-07 NOTE — Telephone Encounter (Signed)
 Left message for daughter Leeroy to call back to schedule.  She can schedule for anytime.  It will be no charge.    When she makes her appointment you will need to order it.  It will come from office not pharmacy.

## 2024-09-07 NOTE — Telephone Encounter (Signed)
 Can I order prolia  at [facility]?

## 2024-09-13 ENCOUNTER — Ambulatory Visit

## 2024-09-20 ENCOUNTER — Ambulatory Visit

## 2024-09-25 ENCOUNTER — Ambulatory Visit: Payer: Self-pay | Admitting: Internal Medicine
